# Patient Record
Sex: Female | Born: 1950
Health system: Southern US, Community
[De-identification: ages and names within clinical notes are randomized; demographics above are authoritative.]

## PROBLEM LIST (undated history)

## (undated) DIAGNOSIS — E785 Hyperlipidemia, unspecified: Secondary | ICD-10-CM

## (undated) DIAGNOSIS — I10 Essential (primary) hypertension: Secondary | ICD-10-CM

## (undated) DIAGNOSIS — M109 Gout, unspecified: Secondary | ICD-10-CM

## (undated) DIAGNOSIS — G629 Polyneuropathy, unspecified: Secondary | ICD-10-CM

## (undated) DIAGNOSIS — K219 Gastro-esophageal reflux disease without esophagitis: Secondary | ICD-10-CM

## (undated) DIAGNOSIS — E119 Type 2 diabetes mellitus without complications: Secondary | ICD-10-CM

## (undated) HISTORY — PX: THYROID SURGERY: SHX805

---

## 2009-11-08 ENCOUNTER — Ambulatory Visit (HOSPITAL_COMMUNITY): Admission: RE | Admit: 2009-11-08 | Discharge: 2009-11-08 | Payer: Self-pay | Admitting: Internal Medicine

## 2010-05-21 ENCOUNTER — Encounter: Payer: Self-pay | Admitting: Internal Medicine

## 2016-10-16 DIAGNOSIS — H2513 Age-related nuclear cataract, bilateral: Secondary | ICD-10-CM | POA: Insufficient documentation

## 2016-10-16 DIAGNOSIS — H16223 Keratoconjunctivitis sicca, not specified as Sjogren's, bilateral: Secondary | ICD-10-CM | POA: Insufficient documentation

## 2017-05-23 DIAGNOSIS — Z7984 Long term (current) use of oral hypoglycemic drugs: Secondary | ICD-10-CM | POA: Insufficient documentation

## 2017-05-23 DIAGNOSIS — H43393 Other vitreous opacities, bilateral: Secondary | ICD-10-CM | POA: Insufficient documentation

## 2017-05-23 DIAGNOSIS — H524 Presbyopia: Secondary | ICD-10-CM | POA: Insufficient documentation

## 2019-01-03 ENCOUNTER — Emergency Department (HOSPITAL_BASED_OUTPATIENT_CLINIC_OR_DEPARTMENT_OTHER)
Admission: EM | Admit: 2019-01-03 | Discharge: 2019-01-03 | Disposition: A | Discharge status: Home / Self Care | Payer: Medicare Other | Attending: Emergency Medicine | Admitting: Emergency Medicine

## 2019-01-03 ENCOUNTER — Encounter (HOSPITAL_BASED_OUTPATIENT_CLINIC_OR_DEPARTMENT_OTHER): Payer: Self-pay | Admitting: Emergency Medicine

## 2019-01-03 ENCOUNTER — Other Ambulatory Visit: Payer: Self-pay

## 2019-01-03 DIAGNOSIS — E119 Type 2 diabetes mellitus without complications: Secondary | ICD-10-CM | POA: Insufficient documentation

## 2019-01-03 DIAGNOSIS — I10 Essential (primary) hypertension: Secondary | ICD-10-CM | POA: Insufficient documentation

## 2019-01-03 DIAGNOSIS — R600 Localized edema: Secondary | ICD-10-CM | POA: Diagnosis not present

## 2019-01-03 DIAGNOSIS — Z79899 Other long term (current) drug therapy: Secondary | ICD-10-CM | POA: Diagnosis not present

## 2019-01-03 DIAGNOSIS — M79673 Pain in unspecified foot: Secondary | ICD-10-CM | POA: Diagnosis present

## 2019-01-03 DIAGNOSIS — R609 Edema, unspecified: Secondary | ICD-10-CM

## 2019-01-03 HISTORY — DX: Type 2 diabetes mellitus without complications: E11.9

## 2019-01-03 HISTORY — DX: Hyperlipidemia, unspecified: E78.5

## 2019-01-03 HISTORY — DX: Essential (primary) hypertension: I10

## 2019-01-03 HISTORY — DX: Gout, unspecified: M10.9

## 2019-01-03 MED ORDER — FUROSEMIDE 20 MG PO TABS: 20.0000 mg | ORAL_TABLET | Freq: Every day | ORAL | 0 refills | Status: DC

## 2019-01-03 NOTE — ED Triage Notes (Signed)
Pt c/o bilateral tingle that started this morning when gettting up.Pt reports hx of gout. Pt ambulatory to triage. Denies numbness, denies injury

## 2019-01-03 NOTE — ED Notes (Signed)
Took her Gout med PTA

## 2019-01-03 NOTE — ED Provider Notes (Signed)
[facility] EMERGENCY DEPARTMENT Provider Note   CSN: [id number] Arrival date & time: 01/03/19  1225     History   Chief Complaint Chief Complaint  Patient presents with  . Foot Pain    HPI Mariah Mason is a 68 y.o. female.     HPI Patient reports her feet started feeling "tight" today.  She reports they both just feel like they are stiff.  She denies that there is actual pain.  Is not painful to walk but the feet feel very stiff.  She denies any pain in the calves or behind the knees.  Patient denies any other associated symptoms of redness, fever, chills.  She reports she thought this was a gout flareup so she took a flareup dose of colchicine about 3 hours ago.  She reports he has had gout in the past and it has been painful. Past Medical History:  Diagnosis Date  . Diabetes mellitus without complication (HCC)   . Gout   . Hyperlipemia   . Hypertension     There are no active problems to display for this patient.      OB History   No obstetric history on file.      Home Medications    Prior to Admission medications   Medication Sig Start Date End Date Taking? Authorizing Provider  allopurinol (ZYLOPRIM) 100 MG tablet 100 mg daily.  03/08/16  Yes [provider]  amLODipine (NORVASC) 10 MG tablet 10 mg daily.  01/20/16  Yes [provider]  benazepril (LOTENSIN) 40 MG tablet Take 80 mg by mouth daily.  02/27/16  Yes [provider]  carvedilol (COREG) 12.5 MG tablet Take 12.5 mg by mouth 2 (two) times daily with a meal.  02/06/16  Yes [provider]  colchicine 0.6 MG tablet 0.6 mg as needed.  03/02/16  Yes [provider]  cycloSPORINE (RESTASIS) 0.05 % ophthalmic emulsion INSTILL 1 DROP IN BOTH EYES EVERY 12 HOURS 12/05/18  Yes [provider]  glipiZIDE (GLUCOTROL) 10 MG tablet 10 mg 2 (two) times daily before a meal.  03/08/16  Yes [provider]  spironolactone (ALDACTONE) 25 MG tablet 25 mg  daily.  11/06/10  Yes [provider]    Family History No family history on file.  Social History Social History   Tobacco Use  . Smoking status: Never Smoker  . Smokeless tobacco: Never Used  Substance Use Topics  . Alcohol use: Never    Frequency: Never  . Drug use: Never     Allergies   Patient has no allergy information on record.   Review of Systems Review of Systems 10 Systems reviewed and are negative for acute change except as noted in the HPI.   Physical Exam Updated Vital Signs BP (!) 168/86 (BP Location: Left Arm)   Pulse 100   Temp 99.2 F (37.3 C) (Oral)   Resp 18   Ht 5\' 2"  (1.575 m)   Wt 102.1 kg   SpO2 100%   BMI 41.15 kg/m   Physical Exam Constitutional:      Comments: Alert nontoxic well in appearance no distress.  Eyes:     Extraocular Movements: Extraocular movements intact.     Conjunctiva/sclera: Conjunctivae normal.  Cardiovascular:     Rate and Rhythm: Normal rate and regular rhythm.  Pulmonary:     Effort: Pulmonary effort is normal.     Breath sounds: Normal breath sounds.  Musculoskeletal:     Comments: Both feet  and lower legs are symmetric.  There is trace to 1+ pitting over the dorsum of both feet and the lower aspects of the legs bilaterally.  Calves are soft and nontender.  No joint effusions.  Patient's feet are not tender to palpation or compression.  There is no erythema of the feet or the toes.  No skin lesions or rashes.  2+ symmetric dorsalis pedis pulses.  Skin:    General: Skin is warm and dry.  Neurological:     General: No focal deficit present.     Mental Status: She is oriented to person, place, and time.     Coordination: Coordination normal.  Psychiatric:        Mood and Affect: Mood normal.      ED Treatments / Results  Labs (all labs ordered are listed, but only abnormal results are displayed) Labs Reviewed - No data to display  EKG None  Radiology No results found.  Procedures  Procedures (including critical care time)  Medications Ordered in ED Medications - No data to display   Initial Impression / Assessment and Plan / ED Course  I have reviewed the triage vital signs and the nursing notes.  Pertinent labs & imaging results that were available during my care of the patient were reviewed by me and considered in my medical decision making (see chart for details).       Patient's main complaint is "tight" feeling of both of her feet.  She denies that it is painful and is nontender to palpation.  I do not feel that this fits the picture for gout.  She does have small amount of pitting edema bilaterally over the dorsum of the feet and the lower legs.  No calf tenderness to suggest DVT.  Findings are very symmetric.  Patient does not have any chest pain or shortness of breath.  Most likely dependent peripheral edema.  Patient does take amlodipine which might be a contributing factor.  She is also prescribed spironolactone which she reports taking once daily.  At this time for peripheral edema will have the patient follow instructions for elevating and avoiding prolonged sitting in 1 position with the feet dependent which is what she reports doing frequently.  We will have her take a low dose of Lasix for the next 4 days and then follow-up with PCP.  Return precautions reviewed.  Final Clinical Impressions(s) / ED Diagnoses   Final diagnoses:  Peripheral edema    ED Discharge Orders    None       Charlesetta Shanks, MD 01/03/19 1517

## 2019-01-03 NOTE — Discharge Instructions (Signed)
1.  Elevate your legs above the level of your heart as often as possible.  Try not to sit in 1 position with your legs down for prolonged periods of time.  Get up and walk around. 2.  Take 1 Lasix tablet daily for the next 4 days.  See your doctor for recheck at that time. 3.  Return to the emergency department if you have worsening swelling, pain, fever, shortness of breath or other concerning symptoms.

## 2019-02-17 ENCOUNTER — Other Ambulatory Visit: Payer: Self-pay

## 2019-02-17 ENCOUNTER — Encounter (HOSPITAL_BASED_OUTPATIENT_CLINIC_OR_DEPARTMENT_OTHER): Payer: Self-pay | Admitting: *Deleted

## 2019-02-17 ENCOUNTER — Inpatient Hospital Stay (HOSPITAL_BASED_OUTPATIENT_CLINIC_OR_DEPARTMENT_OTHER)
Admission: EM | Admit: 2019-02-17 | Discharge: 2019-02-20 | DRG: 638 | Disposition: A | Discharge status: Home / Self Care | Payer: Medicaid Other | Attending: Internal Medicine | Admitting: Internal Medicine

## 2019-02-17 DIAGNOSIS — Z881 Allergy status to other antibiotic agents status: Secondary | ICD-10-CM

## 2019-02-17 DIAGNOSIS — Z20828 Contact with and (suspected) exposure to other viral communicable diseases: Secondary | ICD-10-CM | POA: Diagnosis present

## 2019-02-17 DIAGNOSIS — I10 Essential (primary) hypertension: Secondary | ICD-10-CM | POA: Diagnosis present

## 2019-02-17 DIAGNOSIS — E785 Hyperlipidemia, unspecified: Secondary | ICD-10-CM | POA: Diagnosis present

## 2019-02-17 DIAGNOSIS — E119 Type 2 diabetes mellitus without complications: Secondary | ICD-10-CM

## 2019-02-17 DIAGNOSIS — E860 Dehydration: Secondary | ICD-10-CM | POA: Diagnosis present

## 2019-02-17 DIAGNOSIS — Z7984 Long term (current) use of oral hypoglycemic drugs: Secondary | ICD-10-CM

## 2019-02-17 DIAGNOSIS — E872 Acidosis, unspecified: Secondary | ICD-10-CM

## 2019-02-17 DIAGNOSIS — M109 Gout, unspecified: Secondary | ICD-10-CM | POA: Diagnosis present

## 2019-02-17 DIAGNOSIS — Y92009 Unspecified place in unspecified non-institutional (private) residence as the place of occurrence of the external cause: Secondary | ICD-10-CM

## 2019-02-17 DIAGNOSIS — T502X5A Adverse effect of carbonic-anhydrase inhibitors, benzothiadiazides and other diuretics, initial encounter: Secondary | ICD-10-CM | POA: Diagnosis present

## 2019-02-17 DIAGNOSIS — T383X5A Adverse effect of insulin and oral hypoglycemic [antidiabetic] drugs, initial encounter: Secondary | ICD-10-CM | POA: Diagnosis present

## 2019-02-17 DIAGNOSIS — E162 Hypoglycemia, unspecified: Secondary | ICD-10-CM

## 2019-02-17 DIAGNOSIS — E875 Hyperkalemia: Secondary | ICD-10-CM | POA: Diagnosis present

## 2019-02-17 DIAGNOSIS — E11649 Type 2 diabetes mellitus with hypoglycemia without coma: Principal | ICD-10-CM | POA: Diagnosis present

## 2019-02-17 DIAGNOSIS — Z79899 Other long term (current) drug therapy: Secondary | ICD-10-CM

## 2019-02-17 DIAGNOSIS — N179 Acute kidney failure, unspecified: Secondary | ICD-10-CM | POA: Diagnosis present

## 2019-02-17 DIAGNOSIS — T445X5A Adverse effect of predominantly beta-adrenoreceptor agonists, initial encounter: Secondary | ICD-10-CM | POA: Diagnosis present

## 2019-02-17 LAB — CBG MONITORING, ED
Glucose-Capillary: 44 mg/dL — CL (ref 70–99)
Glucose-Capillary: 55 mg/dL — ABNORMAL LOW (ref 70–99)
Glucose-Capillary: 73 mg/dL (ref 70–99)

## 2019-02-17 LAB — COMPREHENSIVE METABOLIC PANEL
ALT: 16 U/L (ref 0–44)
AST: 17 U/L (ref 15–41)
Albumin: 4.5 g/dL (ref 3.5–5.0)
Alkaline Phosphatase: 58 U/L (ref 38–126)
Anion gap: 9 (ref 5–15)
BUN: 68 mg/dL — ABNORMAL HIGH (ref 8–23)
CO2: 11 mmol/L — ABNORMAL LOW (ref 22–32)
Calcium: 9.4 mg/dL (ref 8.9–10.3)
Chloride: 112 mmol/L — ABNORMAL HIGH (ref 98–111)
Creatinine, Ser: 4.09 mg/dL — ABNORMAL HIGH (ref 0.44–1.00)
GFR calc Af Amer: 12 mL/min — ABNORMAL LOW (ref >60)
GFR calc non Af Amer: 10 mL/min — ABNORMAL LOW (ref >60)
Glucose, Bld: 55 mg/dL — ABNORMAL LOW (ref 70–99)
Potassium: 6.4 mmol/L (ref 3.5–5.1)
Sodium: 132 mmol/L — ABNORMAL LOW (ref 135–145)
Total Bilirubin: 0.4 mg/dL (ref 0.3–1.2)
Total Protein: 7.9 g/dL (ref 6.5–8.1)

## 2019-02-17 LAB — CBC WITH DIFFERENTIAL/PLATELET
Abs Immature Granulocytes: 0.02 10*3/uL (ref 0.00–0.07)
Basophils Absolute: 0 10*3/uL (ref 0.0–0.1)
Basophils Relative: 0 %
Eosinophils Absolute: 0.1 10*3/uL (ref 0.0–0.5)
Eosinophils Relative: 2 %
HCT: 39.4 % (ref 36.0–46.0)
Hemoglobin: 12.2 g/dL (ref 12.0–15.0)
Immature Granulocytes: 0 %
Lymphocytes Relative: 19 %
Lymphs Abs: 1.3 10*3/uL (ref 0.7–4.0)
MCH: 28 pg (ref 26.0–34.0)
MCHC: 31 g/dL (ref 30.0–36.0)
MCV: 90.4 fL (ref 80.0–100.0)
Monocytes Absolute: 0.4 10*3/uL (ref 0.1–1.0)
Monocytes Relative: 6 %
Neutro Abs: 4.7 10*3/uL (ref 1.7–7.7)
Neutrophils Relative %: 73 %
Platelets: 272 10*3/uL (ref 150–400)
RBC: 4.36 MIL/uL (ref 3.87–5.11)
RDW: 13 % (ref 11.5–15.5)
WBC: 6.5 10*3/uL (ref 4.0–10.5)
nRBC: 0 % (ref 0.0–0.2)

## 2019-02-17 MED ORDER — SODIUM ZIRCONIUM CYCLOSILICATE 10 G PO PACK
10.0000 g | PACK | ORAL | Status: AC
  Administered 2019-02-18: 10 g via ORAL
  Filled 2019-02-17: qty 1

## 2019-02-17 MED ORDER — SODIUM CHLORIDE 0.9 % IV BOLUS
500.0000 mL | Freq: Once | INTRAVENOUS | Status: AC
  Administered 2019-02-17: 500 mL via INTRAVENOUS

## 2019-02-17 MED ORDER — DEXTROSE-NACL 5-0.9 % IV SOLN
Freq: Once | INTRAVENOUS | Status: AC
  Administered 2019-02-18: via INTRAVENOUS

## 2019-02-17 MED ORDER — ONDANSETRON HCL 4 MG/2ML IJ SOLN
4.0000 mg | Freq: Once | INTRAMUSCULAR | Status: AC
  Administered 2019-02-17: 22:00:00 4 mg via INTRAVENOUS
  Filled 2019-02-17: qty 2

## 2019-02-17 MED ORDER — ALBUTEROL SULFATE HFA 108 (90 BASE) MCG/ACT IN AERS
8.0000 | INHALATION_SPRAY | Freq: Once | RESPIRATORY_TRACT | Status: AC
  Administered 2019-02-18: 8 via RESPIRATORY_TRACT
  Filled 2019-02-17: qty 6.7

## 2019-02-17 MED ORDER — SODIUM BICARBONATE 8.4 % IV SOLN
50.0000 meq | Freq: Once | INTRAVENOUS | Status: AC
  Administered 2019-02-18: 50 meq via INTRAVENOUS
  Filled 2019-02-17: qty 50

## 2019-02-17 NOTE — ED Notes (Signed)
Pt provided with graham crackers, peanut butter, and ginger ale.

## 2019-02-17 NOTE — ED Notes (Signed)
RN Adam informed of CBG. More orange juice, crackers and peanut butter given to Pt per RN Quita Skye

## 2019-02-17 NOTE — ED Notes (Signed)
Date and time results received: 02/17/19 2340 (use smartphrase ".now" to insert current time)  Test: potassium Critical Value: 6.4  Name of Provider Notified: Dr. Laverta Baltimore  Orders Received? Or Actions Taken?: no new orders

## 2019-02-17 NOTE — ED Provider Notes (Signed)
[facility] EMERGENCY DEPARTMENT Provider Note   CSN: [id number] Arrival date & time: 02/17/19  2115     History   Chief Complaint Chief Complaint  Patient presents with  . Hypoglycemia    HPI Mariah Mason is a 68 y.o. female with history of hypertension, hyperlipidemia, diabetes, gout presents with generalized weakness for the past 2 to 3 days.  Patient has not had much of an appetite.  She denies any unilateral weakness.  She has had low blood sugars at home.  She has been taking Metformin and glipizide.  She does not take insulin.  She reports "drawing up of her legs" that she is experienced with taking her cholesterol medication in the past.  Patient has had some associated nausea, but no vomiting.  She denies any abdominal pain or urinary symptoms.     HPI  Past Medical History:  Diagnosis Date  . Diabetes mellitus without complication (HCC)   . Gout   . Hyperlipemia   . Hypertension     There are no active problems to display for this patient.   History reviewed. No pertinent surgical history.   OB History   No obstetric history on file.      Home Medications    Prior to Admission medications   Medication Sig Start Date End Date Taking? Authorizing Provider  allopurinol (ZYLOPRIM) 100 MG tablet 100 mg daily.  03/08/16   [provider]  amLODipine (NORVASC) 10 MG tablet 10 mg daily.  01/20/16   [provider]  benazepril (LOTENSIN) 40 MG tablet Take 80 mg by mouth daily.  02/27/16   [provider]  carvedilol (COREG) 12.5 MG tablet Take 12.5 mg by mouth 2 (two) times daily with a meal.  02/06/16   [provider]  colchicine 0.6 MG tablet 0.6 mg as needed.  03/02/16   [provider]  cycloSPORINE (RESTASIS) 0.05 % ophthalmic emulsion INSTILL 1 DROP IN BOTH EYES EVERY 12 HOURS 12/05/18   [provider]  furosemide (LASIX) 20 MG tablet Take 1 tablet (20 mg total) by mouth daily for 4 days. 01/03/19  01/07/19  Arby BarrettePfeiffer, Marcy, MD  glipiZIDE (GLUCOTROL) 10 MG tablet 10 mg 2 (two) times daily before a meal.  03/08/16   [provider]  spironolactone (ALDACTONE) 25 MG tablet 25 mg daily.  11/06/10   [provider]    Family History No family history on file.  Social History Social History   Tobacco Use  . Smoking status: Never Smoker  . Smokeless tobacco: Never Used  Substance Use Topics  . Alcohol use: Never    Frequency: Never  . Drug use: Never     Allergies   Erythromycin   Review of Systems Review of Systems  Constitutional: Positive for appetite change and fatigue. Negative for chills and fever.  HENT: Negative for facial swelling and sore throat.   Respiratory: Negative for shortness of breath.   Cardiovascular: Negative for chest pain.  Gastrointestinal: Positive for nausea. Negative for abdominal pain and vomiting.  Genitourinary: Negative for dysuria.  Musculoskeletal: Negative for back pain.  Skin: Negative for rash and wound.  Neurological: Positive for weakness. Negative for headaches.  Psychiatric/Behavioral: The patient is not nervous/anxious.      Physical Exam Updated Vital Signs BP (!) 126/56   Pulse 81   Temp 98.2 F (36.8 C) (Oral)   Resp 16   SpO2 100%   Physical Exam Vitals signs and nursing note reviewed.  Constitutional:  General: She is not in acute distress.    Appearance: She is well-developed. She is not diaphoretic.  HENT:     Head: Normocephalic and atraumatic.     Mouth/Throat:     Pharynx: No oropharyngeal exudate.  Eyes:     General: No scleral icterus.       Right eye: No discharge.        Left eye: No discharge.     Conjunctiva/sclera: Conjunctivae normal.     Pupils: Pupils are equal, round, and reactive to light.  Neck:     Musculoskeletal: Normal range of motion and neck supple.     Thyroid: No thyromegaly.  Cardiovascular:     Rate and Rhythm: Normal rate and regular rhythm.     Heart  sounds: Normal heart sounds. No murmur. No friction rub. No gallop.   Pulmonary:     Effort: Pulmonary effort is normal. No respiratory distress.     Breath sounds: Normal breath sounds. No stridor. No wheezing or rales.  Abdominal:     General: Bowel sounds are normal. There is no distension.     Palpations: Abdomen is soft.     Tenderness: There is no abdominal tenderness. There is no guarding or rebound.  Lymphadenopathy:     Cervical: No cervical adenopathy.  Skin:    General: Skin is warm and dry.     Coloration: Skin is not pale.     Findings: No rash.  Neurological:     Mental Status: She is alert.     Coordination: Coordination normal.     Comments: CN 3-12 intact; normal sensation throughout; 5/5 strength in all 4 extremities; equal bilateral grip strength      ED Treatments / Results  Labs (all labs ordered are listed, but only abnormal results are displayed) Labs Reviewed  CBG MONITORING, ED - Abnormal; Notable for the following components:      Result Value   Glucose-Capillary 44 (*)    All other components within normal limits  CBG MONITORING, ED - Abnormal; Notable for the following components:   Glucose-Capillary 55 (*)    All other components within normal limits  CBC WITH DIFFERENTIAL/PLATELET  COMPREHENSIVE METABOLIC PANEL  URINALYSIS, ROUTINE W REFLEX MICROSCOPIC  CBG MONITORING, ED    EKG EKG Interpretation  Date/Time:  Tuesday February 17 2019 21:29:58 EDT Ventricular Rate:  88 PR Interval:    QRS Duration: 82 QT Interval:  330 QTC Calculation: 400 R Axis:   3 Text Interpretation:  Sinus rhythm No STEMI  Confirmed by Alona Bene [phone]) on 02/17/2019 9:55:33 PM   Radiology No results found.  Procedures Procedures (including critical care time)  Medications Ordered in ED Medications  sodium chloride 0.9 % bolus 500 mL (has no administration in time range)  ondansetron (ZOFRAN) injection 4 mg (4 mg Intravenous Given 02/17/19 2139)      Initial Impression / Assessment and Plan / ED Course  I have reviewed the triage vital signs and the nursing notes.  Pertinent labs & imaging results that were available during my care of the patient were reviewed by me and considered in my medical decision making (see chart for details).        Patient presenting with 2-3 day history of generalized weakness and fatigue.  She has had some associated nausea.  She has had some documented low blood sugars at home.  CBC unremarkable.  CMP pending.  Patient initial CBG 44.  Patient given orange juice and food.  She is alert and oriented.  She is able to eat and swallow. At shift change, patient care transferred to Dr. Randal Buba for continued evaluation, follow up of CMP and serial CBG and determination of disposition. Anticipate discharge if CBG is maintaining and no abnormalities in CMP. Patient also evaluated by Dr. Laverta Baltimore who guided the patient's management.  Final Clinical Impressions(s) / ED Diagnoses   Final diagnoses:  Hypoglycemia    ED Discharge Orders    None       Frederica Kuster, PA-C 02/17/19 2308    Margette Fast, MD 02/18/19 1357

## 2019-02-17 NOTE — ED Notes (Signed)
Pt on monitor 

## 2019-02-17 NOTE — ED Notes (Signed)
ED Provider at bedside.Pt provided with 8 oz of orange juice.

## 2019-02-17 NOTE — ED Triage Notes (Signed)
Hypoglycemia. Not eating. Weakness. Symptoms for a few days per daughter.

## 2019-02-18 ENCOUNTER — Observation Stay (HOSPITAL_COMMUNITY): Payer: Medicaid Other

## 2019-02-18 ENCOUNTER — Encounter (HOSPITAL_BASED_OUTPATIENT_CLINIC_OR_DEPARTMENT_OTHER): Payer: Self-pay

## 2019-02-18 DIAGNOSIS — E785 Hyperlipidemia, unspecified: Secondary | ICD-10-CM | POA: Diagnosis present

## 2019-02-18 DIAGNOSIS — E872 Acidosis, unspecified: Secondary | ICD-10-CM

## 2019-02-18 DIAGNOSIS — E162 Hypoglycemia, unspecified: Secondary | ICD-10-CM | POA: Diagnosis not present

## 2019-02-18 DIAGNOSIS — M109 Gout, unspecified: Secondary | ICD-10-CM | POA: Diagnosis present

## 2019-02-18 DIAGNOSIS — E860 Dehydration: Secondary | ICD-10-CM | POA: Diagnosis present

## 2019-02-18 DIAGNOSIS — Z881 Allergy status to other antibiotic agents status: Secondary | ICD-10-CM | POA: Diagnosis not present

## 2019-02-18 DIAGNOSIS — Z7984 Long term (current) use of oral hypoglycemic drugs: Secondary | ICD-10-CM | POA: Diagnosis not present

## 2019-02-18 DIAGNOSIS — E875 Hyperkalemia: Secondary | ICD-10-CM | POA: Diagnosis present

## 2019-02-18 DIAGNOSIS — I10 Essential (primary) hypertension: Secondary | ICD-10-CM | POA: Diagnosis present

## 2019-02-18 DIAGNOSIS — Z20828 Contact with and (suspected) exposure to other viral communicable diseases: Secondary | ICD-10-CM | POA: Diagnosis present

## 2019-02-18 DIAGNOSIS — T502X5A Adverse effect of carbonic-anhydrase inhibitors, benzothiadiazides and other diuretics, initial encounter: Secondary | ICD-10-CM | POA: Diagnosis present

## 2019-02-18 DIAGNOSIS — T445X5A Adverse effect of predominantly beta-adrenoreceptor agonists, initial encounter: Secondary | ICD-10-CM | POA: Diagnosis present

## 2019-02-18 DIAGNOSIS — Z79899 Other long term (current) drug therapy: Secondary | ICD-10-CM | POA: Diagnosis not present

## 2019-02-18 DIAGNOSIS — E119 Type 2 diabetes mellitus without complications: Secondary | ICD-10-CM

## 2019-02-18 DIAGNOSIS — E11649 Type 2 diabetes mellitus with hypoglycemia without coma: Secondary | ICD-10-CM | POA: Diagnosis present

## 2019-02-18 DIAGNOSIS — Y92009 Unspecified place in unspecified non-institutional (private) residence as the place of occurrence of the external cause: Secondary | ICD-10-CM | POA: Diagnosis not present

## 2019-02-18 DIAGNOSIS — T383X5A Adverse effect of insulin and oral hypoglycemic [antidiabetic] drugs, initial encounter: Secondary | ICD-10-CM | POA: Diagnosis present

## 2019-02-18 DIAGNOSIS — N179 Acute kidney failure, unspecified: Secondary | ICD-10-CM

## 2019-02-18 LAB — URINALYSIS, ROUTINE W REFLEX MICROSCOPIC
Bilirubin Urine: NEGATIVE
Glucose, UA: NEGATIVE mg/dL
Hgb urine dipstick: NEGATIVE
Ketones, ur: NEGATIVE mg/dL
Leukocytes,Ua: NEGATIVE
Nitrite: NEGATIVE
Protein, ur: NEGATIVE mg/dL
Specific Gravity, Urine: 1.005 (ref 1.005–1.030)
pH: 5 (ref 5.0–8.0)

## 2019-02-18 LAB — GLUCOSE, CAPILLARY
Glucose-Capillary: 166 mg/dL — ABNORMAL HIGH (ref 70–99)
Glucose-Capillary: 122 mg/dL — ABNORMAL HIGH (ref 70–99)
Glucose-Capillary: 152 mg/dL — ABNORMAL HIGH (ref 70–99)
Glucose-Capillary: 209 mg/dL — ABNORMAL HIGH (ref 70–99)
Glucose-Capillary: 121 mg/dL — ABNORMAL HIGH (ref 70–99)
Glucose-Capillary: 94 mg/dL (ref 70–99)

## 2019-02-18 LAB — HEMOGLOBIN A1C
Hgb A1c MFr Bld: 7.6 % — ABNORMAL HIGH (ref 4.8–5.6)
Mean Plasma Glucose: 171.42 mg/dL

## 2019-02-18 LAB — HIV ANTIBODY (ROUTINE TESTING W REFLEX): HIV Screen 4th Generation wRfx: NONREACTIVE

## 2019-02-18 LAB — BASIC METABOLIC PANEL
Anion gap: 7 (ref 5–15)
BUN: 63 mg/dL — ABNORMAL HIGH (ref 8–23)
CO2: 14 mmol/L — ABNORMAL LOW (ref 22–32)
Calcium: 8.8 mg/dL — ABNORMAL LOW (ref 8.9–10.3)
Chloride: 112 mmol/L — ABNORMAL HIGH (ref 98–111)
Creatinine, Ser: 4.11 mg/dL — ABNORMAL HIGH (ref 0.44–1.00)
GFR calc Af Amer: 12 mL/min — ABNORMAL LOW (ref >60)
GFR calc non Af Amer: 10 mL/min — ABNORMAL LOW (ref >60)
Glucose, Bld: 173 mg/dL — ABNORMAL HIGH (ref 70–99)
Potassium: 5.9 mmol/L — ABNORMAL HIGH (ref 3.5–5.1)
Sodium: 133 mmol/L — ABNORMAL LOW (ref 135–145)

## 2019-02-18 LAB — CBG MONITORING, ED: Glucose-Capillary: 143 mg/dL — ABNORMAL HIGH (ref 70–99)

## 2019-02-18 LAB — SARS CORONAVIRUS 2 (TAT 6-24 HRS): SARS Coronavirus 2: NEGATIVE

## 2019-02-18 MED ORDER — INSULIN ASPART 100 UNIT/ML ~~LOC~~ SOLN: 0.0000 [IU] | Freq: Every day | SUBCUTANEOUS | Status: DC

## 2019-02-18 MED ORDER — DEXTROSE 5 % IV SOLN
INTRAVENOUS | Status: DC
  Administered 2019-02-18: 04:00:00 via INTRAVENOUS

## 2019-02-18 MED ORDER — SODIUM CHLORIDE 0.9 % IV SOLN
INTRAVENOUS | Status: DC
  Administered 2019-02-18 – 2019-02-20 (×5): via INTRAVENOUS

## 2019-02-18 MED ORDER — AMLODIPINE BESYLATE 10 MG PO TABS
10.0000 mg | ORAL_TABLET | Freq: Every day | ORAL | Status: DC
  Administered 2019-02-18 – 2019-02-20 (×3): 10 mg via ORAL
  Filled 2019-02-18 (×3): qty 1

## 2019-02-18 MED ORDER — HEPARIN SODIUM (PORCINE) 5000 UNIT/ML IJ SOLN
5000.0000 [IU] | Freq: Three times a day (TID) | INTRAMUSCULAR | Status: DC
  Administered 2019-02-18 – 2019-02-20 (×7): 5000 [IU] via SUBCUTANEOUS
  Filled 2019-02-18 (×6): qty 1

## 2019-02-18 MED ORDER — CALCIUM GLUCONATE-NACL 1-0.675 GM/50ML-% IV SOLN
1.0000 g | Freq: Once | INTRAVENOUS | Status: AC
  Administered 2019-02-18: 1000 mg via INTRAVENOUS
  Filled 2019-02-18: qty 50

## 2019-02-18 MED ORDER — FLUTICASONE PROPIONATE 50 MCG/ACT NA SUSP
1.0000 | Freq: Every day | NASAL | Status: DC | PRN
  Filled 2019-02-18: qty 16

## 2019-02-18 MED ORDER — ACETAMINOPHEN 325 MG PO TABS: 650.0000 mg | ORAL_TABLET | Freq: Four times a day (QID) | ORAL | Status: DC | PRN

## 2019-02-18 MED ORDER — CYCLOSPORINE 0.05 % OP EMUL
1.0000 [drp] | Freq: Two times a day (BID) | OPHTHALMIC | Status: DC
  Administered 2019-02-18 – 2019-02-20 (×5): 1 [drp] via OPHTHALMIC
  Filled 2019-02-18 (×6): qty 30

## 2019-02-18 MED ORDER — CARVEDILOL 12.5 MG PO TABS
12.5000 mg | ORAL_TABLET | Freq: Two times a day (BID) | ORAL | Status: DC
  Administered 2019-02-18 – 2019-02-20 (×4): 12.5 mg via ORAL
  Filled 2019-02-18 (×4): qty 1

## 2019-02-18 MED ORDER — COLCHICINE 0.6 MG PO TABS: 0.6000 mg | ORAL_TABLET | Freq: Every day | ORAL | Status: DC | PRN

## 2019-02-18 MED ORDER — SODIUM CHLORIDE 0.9 % IV SOLN
INTRAVENOUS | Status: DC
  Administered 2019-02-18: 04:00:00 via INTRAVENOUS

## 2019-02-18 MED ORDER — INSULIN ASPART 100 UNIT/ML ~~LOC~~ SOLN
0.0000 [IU] | Freq: Three times a day (TID) | SUBCUTANEOUS | Status: DC
  Administered 2019-02-18: 18:00:00 3 [IU] via SUBCUTANEOUS
  Administered 2019-02-19: 13:00:00 2 [IU] via SUBCUTANEOUS
  Administered 2019-02-19 – 2019-02-20 (×3): 1 [IU] via SUBCUTANEOUS

## 2019-02-18 MED ORDER — ACETAMINOPHEN 650 MG RE SUPP: 650.0000 mg | Freq: Four times a day (QID) | RECTAL | Status: DC | PRN

## 2019-02-18 MED ORDER — ALLOPURINOL 100 MG PO TABS
100.0000 mg | ORAL_TABLET | Freq: Every day | ORAL | Status: DC
  Administered 2019-02-18 – 2019-02-20 (×3): 100 mg via ORAL
  Filled 2019-02-18 (×3): qty 1

## 2019-02-18 NOTE — TOC Progression Note (Signed)
Transition of Care [facility]) - Progression Note    Patient Details  Name: Mariah Mason MRN: [id number] Date of Birth: Nov 01, 1950  [facility] CM/SW Contact  Zenon Mayo, RN Phone Number: 02/18/2019, 3:49 PM  Clinical Narrative:    Patient from home with daughter , Oley Balm.  Patient states she would like to [facility] [place] to the [facility] here at [facility].  NCM will ast patient in getting apt at the clinic.          Expected Discharge Plan and Services                                                 Social Determinants of Health (SDOH) Interventions    Readmission Risk Interventions No flowsheet data found.

## 2019-02-18 NOTE — ED Notes (Signed)
[facility] here to transport pt 

## 2019-02-18 NOTE — Progress Notes (Signed)
PROGRESS NOTE    Cloma Rahrig  ZJQ:[id number] DOB: 08/13/1950 DOA: 02/17/2019 PCP[facility]    Brief Narrative:  68 y.o. female with medical history significant of non-insulin-dependent diabetes mellitus, hypertension, hyperlipidemia, gout presented to the ED with complaints of hypoglycemia, decreased appetite, and generalized weakness.  Patient states she has not been feeling well for the past few days.  She has felt nauseous but has not vomited.  No abdominal pain or diarrhea.  She has not been eating much due to nausea.  She continues to take her diabetes medications including Metformin and glipizide.  States her blood sugar was low at home which led her to come into the emergency room to be evaluated.  Denies fevers, chills, chest pain, or shortness of breath.  Denies dysuria.  States her nausea has now resolved and she was able to eat food in the ED without any problems.  ED Course: Initial CBG 44.  She was given orange juice and food.  Continued to be hypoglycemic and started on D5-normal saline infusion.  Potassium 6.4. Received medical treatment for hyperkalemia including sodium bicarbonate and Lokelma.  Bicarb 11, anion gap 9.  BUN 68, creatinine 4.0.  No prior baseline  Assessment & Plan:   Principal Problem:   Hypoglycemia Active Problems:   Type 2 diabetes mellitus (HCC)   AKI (acute kidney injury) (HCC)   Hyperkalemia   Metabolic acidosis   Hypoglycemia, non-insulin-dependent diabetes mellitus -Presenting hypoglycemia likely due to glipizide use in the setting of poor oral intake for the past few days.  -Initial CBG 44 in the ED but continued to be hypoglycemic despite receiving orange juice and food.  -Improved overnight with D5 fluids. -Continue to hold glipizide and other hypoglycemic agents -Will cont on carb mod diet  AKI -Presenting BUN 68, creatinine 4.0.  N -Most recent prior Cr from 1/12 noted to be 0.72 -Likely prerenal due to  severe dehydration and home ACE inhibitor and diuretic use.  -Continued with IVF hydration -Renal ultrasound reviewed personally, unremarkable -Very good urine output -Continue to avoid nephrotoxic agents/contrast -Cr slightly higher at 4.11 from 4.09. Will continue IVF hydration, repeat bmet in AM  Hyperkalemia in the setting of AKI -Potassium 6.4 at presentation -Pt given sodium bicarbonate and Lokelma in the ED. -Potassium normalized  Normal anion gap metabolic acidosis -Bicarb 11, anion gap 9 at time of presentation -Serum bicarb improved to 14 with IVF hydration -Repeat BMET in AM  DVT prophylaxis: Heparin subq Code Status: Full Family Communication: Pt in room, family not at bedside Disposition Plan: Uncertain at this time  Consultants:     Procedures:     Antimicrobials: Anti-infectives (From admission, onward)   None       Subjective: Reports feeling better today  Objective: Vitals:   02/18/19 0316 02/18/19 0326 02/18/19 0801 02/18/19 1122  BP: (!) 124/53  (!) 155/71 (!) 148/69  Pulse: 94  91 93  Resp: 18  18 20   Temp: 98.9 F (37.2 C)  98.3 F (36.8 C) 98.4 F (36.9 C)  TempSrc: Oral  Oral Oral  SpO2: 100%  100% 100%  Weight:  95 kg    Height:  5\' 2"  (1.575 m)      Intake/Output Summary (Last 24 hours) at 02/18/2019 1406 Last data filed at 02/18/2019 0743 Gross per 24 hour  Intake 1101.34 ml  Output 300 ml  Net 801.34 ml   Filed Weights   02/18/19 0326  Weight: 95 kg  Examination:  General exam: Appears calm and comfortable  Respiratory system: Clear to auscultation. Respiratory effort normal. Cardiovascular system: S1 & S2 heard, RRR Gastrointestinal system: Abdomen is nondistended, soft and nontender. No organomegaly or masses felt. Normal bowel sounds heard. Central nervous system: Alert and oriented. No focal neurological deficits. Extremities: Symmetric 5 x 5 power. Skin: No rashes, lesions Psychiatry: Judgement and  insight appear normal. Mood & affect appropriate.   Data Reviewed: I have personally reviewed following labs and imaging studies  CBC: Recent Labs  Lab 02/17/19 2135  WBC 6.5  NEUTROABS 4.7  HGB 12.2  HCT 39.4  MCV 90.4  PLT 272   Basic Metabolic Panel: Recent Labs  Lab 02/17/19 2135 02/18/19 0432  NA 132* 133*  K 6.4* 5.9*  CL 112* 112*  CO2 11* 14*  GLUCOSE 55* 173*  BUN 68* 63*  CREATININE 4.09* 4.11*  CALCIUM 9.4 8.8*   GFR: Estimated Creatinine Clearance: 13.9 mL/min (A) (by C-G formula based on SCr of 4.11 mg/dL (H)). Liver Function Tests: Recent Labs  Lab 02/17/19 2135  AST 17  ALT 16  ALKPHOS 58  BILITOT 0.4  PROT 7.9  ALBUMIN 4.5   No results for input(s): LIPASE, AMYLASE in the last 168 hours. No results for input(s): AMMONIA in the last 168 hours. Coagulation Profile: No results for input(s): INR, PROTIME in the last 168 hours. Cardiac Enzymes: No results for input(s): CKTOTAL, CKMB, CKMBINDEX, TROPONINI in the last 168 hours. BNP (last 3 results) No results for input(s): PROBNP in the last 8760 hours. HbA1C: Recent Labs    02/18/19 0432  HGBA1C 7.6*   CBG: Recent Labs  Lab 02/17/19 2241 02/18/19 0052 02/18/19 0313 02/18/19 0627 02/18/19 0807  GLUCAP 73 143* 166* 122* 152*   Lipid Profile: No results for input(s): CHOL, HDL, LDLCALC, TRIG, CHOLHDL, LDLDIRECT in the last 72 hours. Thyroid Function Tests: No results for input(s): TSH, T4TOTAL, FREET4, T3FREE, THYROIDAB in the last 72 hours. Anemia Panel: No results for input(s): VITAMINB12, FOLATE, FERRITIN, TIBC, IRON, RETICCTPCT in the last 72 hours. Sepsis Labs: No results for input(s): PROCALCITON, LATICACIDVEN in the last 168 hours.  No results found for this or any previous visit (from the past 240 hour(s)).   Radiology Studies: [place] Renal  Result Date: 02/18/2019 CLINICAL DATA:  Acute kidney injury. EXAM: RENAL / URINARY TRACT ULTRASOUND COMPLETE COMPARISON:  None.  FINDINGS: Right Kidney: Renal measurements: 10.8 x 5.0 x 4.4 cm = volume: 123 mL . Echogenicity within normal limits. No mass or hydronephrosis visualized. Left Kidney: Renal measurements: 10.3 x 5.8 x 5.7 cm = volume: 177 mL. Two simple cysts are noted, the largest measuring 1.9 cm echogenicity within normal limits. No mass or hydronephrosis visualized. Bladder: Appears normal for degree of bladder distention. Other: None. IMPRESSION: Two simple left renal cysts.  No other renal abnormality is noted. Electronically Signed   By: Lupita RaiderJames  Green Jr M.D.   On: 02/18/2019 09:47    Scheduled Meds: . allopurinol  100 mg Oral Daily  . amLODipine  10 mg Oral Daily  . carvedilol  12.5 mg Oral BID WC  . cycloSPORINE  1 drop Both Eyes BID  . heparin  5,000 Units Subcutaneous Q8H  . insulin aspart  0-5 Units Subcutaneous QHS  . insulin aspart  0-9 Units Subcutaneous TID WC   Continuous Infusions: . sodium chloride 100 mL/hr at 02/18/19 1331     LOS: 0 days   Rickey BarbaraStephen , MD [facility] Pager On Amion  If 7PM-7AM, please contact night-coverage 02/18/2019, 2:06 PM

## 2019-02-18 NOTE — ED Notes (Signed)
ED TO INPATIENT HANDOFF REPORT  ED Nurse Name and Phone #: Marisa Hua RN [phone]  S Name/Age/Gender Mariah Mason 68 y.o. female Room/Bed: MH01/MH01  Code Status   Code Status: Not on file  Home/SNF/Other Home Patient oriented to: self, place, time and situation Is this baseline? Yes   Triage Complete: Triage complete  Chief Complaint weakness and sugar high  Triage Note Hypoglycemia. Not eating. Weakness. Symptoms for a few days per daughter.    Allergies Allergies  Allergen Reactions  . Erythromycin Other (See Comments)    Level of Care/Admitting Diagnosis ED Disposition    ED Disposition Condition Comment   Admit  Hospital Area: [facility] [[id number]]  Level of Care: Telemetry Medical [104]  I expect the patient will be discharged within 24 hours: No (not a candidate for 5C-Observation unit)  Covid Evaluation: Asymptomatic Screening Protocol (No Symptoms)  Diagnosis: Hypoglycemia [938101]  Admitting Physician: Rise Patience [phone]  Attending Physician: Rise Patience [email or web address]  PT Class (Do Not Modify): Observation [104]  PT Acc Code (Do Not Modify): Observation [10022]       B Medical/Surgery History Past Medical History:  Diagnosis Date  . Diabetes mellitus without complication ([facility])   . Gout   . Hyperlipemia   . Hypertension    History reviewed. No pertinent surgical history.   A IV Location/Drains/Wounds Patient Lines/Drains/Airways Status   Active Line/Drains/Airways    Name:   Placement date:   Placement time:   Site:   Days:   Peripheral IV 02/17/19 Left Antecubital   02/17/19    2135    Antecubital   1          Intake/Output Last 24 hours  Intake/Output Summary (Last 24 hours) at 02/18/2019 0140 Last data filed at 02/18/2019 0021 Gross per 24 hour  Intake 500.1 ml  Output -  Net 500.1 ml    Labs/Imaging Results for orders placed or performed during the hospital encounter of 02/17/19 (from the past  48 hour(s))  CBG monitoring, ED     Status: Abnormal   Collection Time: 02/17/19  9:24 PM  Result Value Ref Range   Glucose-Capillary 44 (LL) 70 - 99 mg/dL   Comment 1 Document in Chart   Comprehensive metabolic panel     Status: Abnormal   Collection Time: 02/17/19  9:35 PM  Result Value Ref Range   Sodium 132 (L) 135 - 145 mmol/L   Potassium 6.4 (HH) 3.5 - 5.1 mmol/L    Comment: CRITICAL RESULT CALLED TO, READ BACK BY AND VERIFIED WITH: NEAL KELLIE RN AT 2585 ON 02/17/19 BY I.[email or web address] NO VISIBLE HEMOLYSIS    Chloride 112 (H) 98 - 111 mmol/L   CO2 11 (L) 22 - 32 mmol/L   Glucose, Bld 55 (L) 70 - 99 mg/dL   BUN 68 (H) 8 - 23 mg/dL   Creatinine, Ser 4.09 (H) 0.44 - 1.00 mg/dL   Calcium 9.4 8.9 - 10.3 mg/dL   Total Protein 7.9 6.5 - 8.1 g/dL   Albumin 4.5 3.5 - 5.0 g/dL   AST 17 15 - 41 U/L   ALT 16 0 - 44 U/L   Alkaline Phosphatase 58 38 - 126 U/L   Total Bilirubin 0.4 0.3 - 1.2 mg/dL   GFR calc non Af Amer 10 (L) >60 mL/min   GFR calc Af Amer 12 (L) >60 mL/min   Anion gap 9 5 - 15    Comment: Performed at [facility], [address] [place], [place] [address]  CBC with Differential     Status: None   Collection Time: 02/17/19  9:35 PM  Result Value Ref Range   WBC 6.5 4.0 - 10.5 K/uL   RBC 4.36 3.87 - 5.11 MIL/uL   Hemoglobin 12.2 12.0 - 15.0 g/dL   HCT 32.6 71.2 - 45.8 %   MCV 90.4 80.0 - 100.0 fL   MCH 28.0 26.0 - 34.0 pg   MCHC 31.0 30.0 - 36.0 g/dL   RDW 09.9 83.3 - 82.5 %   Platelets 272 150 - 400 K/uL   nRBC 0.0 0.0 - 0.2 %   Neutrophils Relative % 73 %   Neutro Abs 4.7 1.7 - 7.7 K/uL   Lymphocytes Relative 19 %   Lymphs Abs 1.3 0.7 - 4.0 K/uL   Monocytes Relative 6 %   Monocytes Absolute 0.4 0.1 - 1.0 K/uL   Eosinophils Relative 2 %   Eosinophils Absolute 0.1 0.0 - 0.5 K/uL   Basophils Relative 0 %   Basophils Absolute 0.0 0.0 - 0.1 K/uL   Immature Granulocytes 0 %   Abs Immature Granulocytes 0.02 0.00 - 0.07 K/uL    Comment: Performed at [facility], [address] [place], [place] [address]  POC CBG, ED     Status: Abnormal   Collection Time: 02/17/19 10:06 PM  Result Value Ref Range   Glucose-Capillary 55 (L) 70 - 99 mg/dL  CBG monitoring, ED     Status: None   Collection Time: 02/17/19 10:41 PM  Result Value Ref Range   Glucose-Capillary 73 70 - 99 mg/dL  CBG monitoring, ED     Status: Abnormal   Collection Time: 02/18/19 12:52 AM  Result Value Ref Range   Glucose-Capillary 143 (H) 70 - 99 mg/dL   No results found.  Pending Labs Unresulted Labs (From admission, onward)    Start     Ordered   02/17/19 2341  SARS CORONAVIRUS 2 (TAT 6-24 HRS) Nasopharyngeal Nasopharyngeal Swab  (Symptomatic/High Risk of Exposure/Tier 1 Patients Labs with Precautions)  Once,   STAT    Question Answer Comment  Is this test for diagnosis or screening Diagnosis of ill patient   Symptomatic for COVID-19 as defined by CDC Yes   Date of Symptom Onset 02/17/2019   Hospitalized for COVID-19 Yes   Admitted to ICU for COVID-19 No   Previously tested for COVID-19 No   Resident in a congregate (group) care setting No   Employed in healthcare setting No   Pregnant No      02/17/19 2342   02/17/19 2132  Urinalysis, Routine w reflex microscopic  ONCE - STAT,   STAT     02/17/19 2132          Vitals/Pain Today's Vitals   02/18/19 0000 02/18/19 0003 02/18/19 0030 02/18/19 0130  BP: 139/63  (!) 148/73 140/69  Pulse: 90  93 93  Resp: 17  16 17   Temp:      TempSrc:      SpO2: 100% 99% 100% 99%  PainSc:        Isolation Precautions No active isolations  Medications Medications  ondansetron (ZOFRAN) injection 4 mg (4 mg Intravenous Given 02/17/19 2139)  sodium chloride 0.9 % bolus 500 mL ( Intravenous Stopped 02/18/19 0014)  sodium bicarbonate injection 50 mEq (50 mEq Intravenous Given 02/18/19 0018)  sodium zirconium cyclosilicate (LOKELMA) packet 10 g (10 g Oral Given 02/18/19 0011)  albuterol (  VENTOLIN HFA) 108 (90  Base) MCG/ACT inhaler 8 puff (8 puffs Inhalation Given 02/18/19 0003)  dextrose 5 %-0.9 % sodium chloride infusion ( Intravenous New Bag/Given 02/18/19 0018)    Mobility walks with device Low fall risk       R Recommendations: See Admitting Provider Note  Report given to:   Additional Notes:

## 2019-02-18 NOTE — H&P (Signed)
History and Physical    Mariah Mason ZDG:[id number] DOB: 03/31/1951 DOA: 02/17/2019  [facility]  Chief Complaint: Hypoglycemia  HPI: Mariah Mason is a 68 y.o. female with medical history significant of non-insulin-dependent diabetes mellitus, hypertension, hyperlipidemia, gout presented to the ED with complaints of hypoglycemia, decreased appetite, and generalized weakness.  Patient states she has not been feeling well for the past few days.  She has felt nauseous but has not vomited.  No abdominal pain or diarrhea.  She has not been eating much due to nausea.  She continues to take her diabetes medications including Metformin and glipizide.  States her blood sugar was low at home which led her to come into the emergency room to be evaluated.  Denies fevers, chills, chest pain, or shortness of breath.  Denies dysuria.  States her nausea has now resolved and she was able to eat food in the ED without any problems.  ED Course: Initial CBG 44.  She was given orange juice and food.  Continued to be hypoglycemic and started on D5-normal saline infusion.  Potassium 6.4. Received medical treatment for hyperkalemia including sodium bicarbonate and Lokelma.  Bicarb 11, anion gap 9.  BUN 68, creatinine 4.0.  No prior baseline.  No leukocytosis.  SARS-CoV-2 test pending.  Review of Systems:  All systems reviewed and apart from history of presenting illness, are negative.  Past Medical History:  Diagnosis Date  . Diabetes mellitus without complication (HCC)   . Gout   . Hyperlipemia   . Hypertension     History reviewed. No pertinent surgical history.   reports that she has never smoked. She has never used smokeless tobacco. She reports that she does not drink alcohol or use drugs.  Allergies  Allergen Reactions  . Erythromycin Other (See Comments)    History reviewed. No pertinent family history.  Prior to Admission medications    Medication Sig Start Date End Date Taking? Authorizing Provider  allopurinol (ZYLOPRIM) 100 MG tablet 100 mg daily.  03/08/16   [provider]  amLODipine (NORVASC) 10 MG tablet 10 mg daily.  01/20/16   [provider]  benazepril (LOTENSIN) 40 MG tablet Take 80 mg by mouth daily.  02/27/16   [provider]  carvedilol (COREG) 12.5 MG tablet Take 12.5 mg by mouth 2 (two) times daily with a meal.  02/06/16   [provider]  colchicine 0.6 MG tablet 0.6 mg as needed.  03/02/16   [provider]  cycloSPORINE (RESTASIS) 0.05 % ophthalmic emulsion INSTILL 1 DROP IN BOTH EYES EVERY 12 HOURS 12/05/18   [provider]  furosemide (LASIX) 20 MG tablet Take 1 tablet (20 mg total) by mouth daily for 4 days. 01/03/19 01/07/19  Arby BarrettePfeiffer, Marcy, MD  glipiZIDE (GLUCOTROL) 10 MG tablet 10 mg 2 (two) times daily before a meal.  03/08/16   [provider]  spironolactone (ALDACTONE) 25 MG tablet 25 mg daily.  11/06/10   [provider]    Physical Exam: Vitals:   02/18/19 0130 02/18/19 0200 02/18/19 0316 02/18/19 0326  BP: 140/69 140/65 (!) 124/53   Pulse: 93 89 94   Resp: 17 17 18    Temp:   98.9 F (37.2 C)   TempSrc:   Oral   SpO2: 99% 100% 100%   Weight:    95 kg  Height:    5\' 2"  (1.575 m)    Physical Exam  Constitutional: She is oriented  to person, place, and time. She appears well-developed and well-nourished. No distress.  HENT:  Head: Normocephalic.  Dry mucous membranes  Eyes: Right eye exhibits no discharge. Left eye exhibits no discharge.  Neck: Neck supple.  Cardiovascular: Normal rate, regular rhythm and intact distal pulses.  Pulmonary/Chest: Effort normal and breath sounds normal. No respiratory distress. She has no wheezes. She has no rales.  Abdominal: Soft. Bowel sounds are normal. She exhibits no distension. There is no abdominal tenderness. There is no guarding.  Musculoskeletal:        General: No edema.   Neurological: She is alert and oriented to person, place, and time.  Skin: Skin is warm and dry. She is not diaphoretic.     Labs on Admission: I have personally reviewed following labs and imaging studies  CBC: Recent Labs  Lab 02/17/19 2135  WBC 6.5  NEUTROABS 4.7  HGB 12.2  HCT 39.4  MCV 90.4  PLT 272   Basic Metabolic Panel: Recent Labs  Lab 02/17/19 2135  NA 132*  K 6.4*  CL 112*  CO2 11*  GLUCOSE 55*  BUN 68*  CREATININE 4.09*  CALCIUM 9.4   GFR: Estimated Creatinine Clearance: 14 mL/min (A) (by C-G formula based on SCr of 4.09 mg/dL (H)). Liver Function Tests: Recent Labs  Lab 02/17/19 2135  AST 17  ALT 16  ALKPHOS 58  BILITOT 0.4  PROT 7.9  ALBUMIN 4.5   No results for input(s): LIPASE, AMYLASE in the last 168 hours. No results for input(s): AMMONIA in the last 168 hours. Coagulation Profile: No results for input(s): INR, PROTIME in the last 168 hours. Cardiac Enzymes: No results for input(s): CKTOTAL, CKMB, CKMBINDEX, TROPONINI in the last 168 hours. BNP (last 3 results) No results for input(s): PROBNP in the last 8760 hours. HbA1C: No results for input(s): HGBA1C in the last 72 hours. CBG: Recent Labs  Lab 02/17/19 2124 02/17/19 2206 02/17/19 2241 02/18/19 0052  GLUCAP 44* 55* 73 143*   Lipid Profile: No results for input(s): CHOL, HDL, LDLCALC, TRIG, CHOLHDL, LDLDIRECT in the last 72 hours. Thyroid Function Tests: No results for input(s): TSH, T4TOTAL, FREET4, T3FREE, THYROIDAB in the last 72 hours. Anemia Panel: No results for input(s): VITAMINB12, FOLATE, FERRITIN, TIBC, IRON, RETICCTPCT in the last 72 hours. Urine analysis: No results found for: COLORURINE, APPEARANCEUR, LABSPEC, PHURINE, GLUCOSEU, HGBUR, BILIRUBINUR, KETONESUR, PROTEINUR, UROBILINOGEN, NITRITE, LEUKOCYTESUR  Radiological Exams on Admission: No results found.  EKG: Independently reviewed.  Sinus rhythm.  Peaked T waves in V2 and V3, no prior EKG for comparison.   Assessment/Plan Principal Problem:   Hypoglycemia Active Problems:   Type 2 diabetes mellitus (HCC)   AKI (acute kidney injury) (HCC)   Hyperkalemia   Metabolic acidosis   Hypoglycemia, non-insulin-dependent diabetes mellitus Hypoglycemia likely due to glipizide use in the setting of poor oral intake for the past few days.  Initial CBG 44 in the ED but continued to be hypoglycemic despite receiving orange juice and food.  She was started on D5 infusion.  Blood glucose now improved. -Continue D5 infusion at 50 cc/h -CBG checks every 2 hours -Hold glipizide and other hypoglycemic agents -Check A1c -Regular diet  AKI BUN 68, creatinine 4.0.  No prior baseline.  Likely prerenal due to severe dehydration and home ACE inhibitor and diuretic use.  Appears dry on exam. -IV fluid hydration -Continue to monitor renal function -Renal ultrasound -Monitor urine output -Avoid nephrotoxic agents/contrast -UA pending  Hyperkalemia in the setting of AKI Potassium  6.4.  EKG with peaked T waves in V2 and V3, no prior EKG for comparison. -Received medical treatment for hyperkalemia including sodium bicarbonate and Lokelma in the ED. -Calcium gluconate -Repeat potassium level and continue medical therapy if potassium still high -Repeat EKG  Normal anion gap metabolic acidosis Bicarb 11, anion gap 9.  Likely related to AKI. -IV fluid hydration -Continue to monitor BMP  DVT prophylaxis: Subcutaneous heparin Code Status: Patient wishes to be full code. Family Communication: No family available. Disposition Plan: Anticipate discharge after clinical improvement. Consults called: None Admission status: It is my clinical opinion that referral for OBSERVATION is reasonable and necessary in this patient based on the above information provided. The aforementioned taken together are felt to place the patient at high risk for further clinical deterioration. However it is anticipated that the patient may  be medically stable for discharge from the hospital within 24 to 48 hours.  The medical decision making on this patient was of high complexity and the patient is at high risk for clinical deterioration, therefore this is a level 3 visit.  Shela Leff MD [facility] Pager [phone]  If 7PM-7AM, please contact night-coverage [email or web address] Password [email or web address]  02/18/2019, 5:06 AM

## 2019-02-18 NOTE — Progress Notes (Signed)
Pt. Arrived to the floor via [facility]. Alert/oriented x4. 1 assist with mobility. No s/s distress noted.  Denies pain/ nausea at this time. CBG 166. Covid test in process, asymptomatic. IVF infusing. Skin check completed/ dry & intact. Bed low with wheels locked. Call bell within reach. [facility] admissions made aware and MD notified of patient arrival.   Pt also made aware of visitor policy. She has several daughters that she wants to designate visitor after speaking with them later today.

## 2019-02-18 NOTE — ED Notes (Signed)
[facility] notified (Tara) - patient ready for transport 

## 2019-02-19 LAB — COMPREHENSIVE METABOLIC PANEL
ALT: 13 U/L (ref 0–44)
AST: 12 U/L — ABNORMAL LOW (ref 15–41)
Albumin: 3.5 g/dL (ref 3.5–5.0)
Alkaline Phosphatase: 53 U/L (ref 38–126)
Anion gap: 4 — ABNORMAL LOW (ref 5–15)
BUN: 47 mg/dL — ABNORMAL HIGH (ref 8–23)
CO2: 14 mmol/L — ABNORMAL LOW (ref 22–32)
Calcium: 8.6 mg/dL — ABNORMAL LOW (ref 8.9–10.3)
Chloride: 121 mmol/L — ABNORMAL HIGH (ref 98–111)
Creatinine, Ser: 2.37 mg/dL — ABNORMAL HIGH (ref 0.44–1.00)
GFR calc Af Amer: 23 mL/min — ABNORMAL LOW (ref >60)
GFR calc non Af Amer: 20 mL/min — ABNORMAL LOW (ref >60)
Glucose, Bld: 142 mg/dL — ABNORMAL HIGH (ref 70–99)
Potassium: 6 mmol/L — ABNORMAL HIGH (ref 3.5–5.1)
Sodium: 139 mmol/L (ref 135–145)
Total Bilirubin: 0.6 mg/dL (ref 0.3–1.2)
Total Protein: 6.3 g/dL — ABNORMAL LOW (ref 6.5–8.1)

## 2019-02-19 LAB — GLUCOSE, CAPILLARY
Glucose-Capillary: 127 mg/dL — ABNORMAL HIGH (ref 70–99)
Glucose-Capillary: 154 mg/dL — ABNORMAL HIGH (ref 70–99)
Glucose-Capillary: 117 mg/dL — ABNORMAL HIGH (ref 70–99)
Glucose-Capillary: 129 mg/dL — ABNORMAL HIGH (ref 70–99)

## 2019-02-19 LAB — CBC
HCT: 32.8 % — ABNORMAL LOW (ref 36.0–46.0)
Hemoglobin: 10.5 g/dL — ABNORMAL LOW (ref 12.0–15.0)
MCH: 28.1 pg (ref 26.0–34.0)
MCHC: 32 g/dL (ref 30.0–36.0)
MCV: 87.7 fL (ref 80.0–100.0)
Platelets: 212 10*3/uL (ref 150–400)
RBC: 3.74 MIL/uL — ABNORMAL LOW (ref 3.87–5.11)
RDW: 13.1 % (ref 11.5–15.5)
WBC: 3.9 10*3/uL — ABNORMAL LOW (ref 4.0–10.5)
nRBC: 0 % (ref 0.0–0.2)

## 2019-02-19 MED ORDER — SODIUM POLYSTYRENE SULFONATE 15 GM/60ML PO SUSP
30.0000 g | Freq: Once | ORAL | Status: AC
  Administered 2019-02-19: 14:00:00 30 g via ORAL
  Filled 2019-02-19: qty 120

## 2019-02-19 MED ORDER — SODIUM BICARBONATE 650 MG PO TABS
650.0000 mg | ORAL_TABLET | Freq: Two times a day (BID) | ORAL | Status: DC
  Administered 2019-02-19 – 2019-02-20 (×3): 650 mg via ORAL
  Filled 2019-02-19 (×3): qty 1

## 2019-02-19 NOTE — Progress Notes (Signed)
Diabetes Coordinator consult ordered and exit notes Diabetes and nutrition and Hypoglycemia given to patient for education.Arthor Captain Lpn

## 2019-02-19 NOTE — Progress Notes (Signed)
Inpatient Diabetes Program Recommendations  AACE/ADA: New Consensus Statement on Inpatient Glycemic Control (2015)  Target Ranges:  Prepandial:   less than 140 mg/dL      Peak postprandial:   less than 180 mg/dL (1-2 hours)      Critically ill patients:  140 - 180 mg/dL   Lab Results  Component Value Date   GLUCAP 154 (H) 02/19/2019   HGBA1C 7.6 (H) 02/18/2019    Review of Glycemic Control  Diabetes history: DM2 Outpatient Diabetes medications: metformin 1000 mg bid, glipizide 10 mg bid Current orders for Inpatient glycemic control: Novolog 0-9 units tidwc and 0-5 units QHS  HgbA1C - 7.6%  Inpatient Diabetes Program Recommendations:     D/C glipizide and f/u with PCP.  Likely glipizide is causing hypoglycemia since Creatinine is 2.37. May benefit from addition of DPP-4 such as Januvia or Tradjenta.  Will continue to follow.  Thank you. Lorenda Peck, RD, LDN, CDE Inpatient Diabetes Coordinator [phone]

## 2019-02-19 NOTE — Plan of Care (Signed)
  Problem: Education: Goal: Knowledge of General Education information will improve Description: Including pain rating scale, medication(s)/side effects and non-pharmacologic comfort measures Outcome: Progressing  Exit notes on Diabetes and nutrition, hypoglycemia given Problem: Activity: Goal: Risk for activity intolerance will decrease Outcome: Progressing   Problem: Nutrition: Goal: Adequate nutrition will be maintained Outcome: Progressing   Problem: Elimination: Goal: Will not experience complications related to bowel motility Outcome: Progressing Goal: Will not experience complications related to urinary retention Outcome: Progressing   Problem: Coping: Goal: Level of anxiety will decrease Outcome: Progressing   Problem: Pain Managment: Goal: General experience of comfort will improve Outcome: Progressing   Problem: Safety: Goal: Ability to remain free from injury will improve Outcome: Progressing   Problem: Skin Integrity: Goal: Risk for impaired skin integrity will decrease Outcome: Progressing

## 2019-02-19 NOTE — Progress Notes (Signed)
PROGRESS NOTE    Mariah Mason  ZOX:[id number] DOB: [birth year] DOA: 02/17/2019 PCP: [facility]    Brief Narrative:  68 y.o. female with medical history significant of non-insulin-dependent diabetes mellitus, hypertension, hyperlipidemia, gout presented to the ED with complaints of hypoglycemia, decreased appetite, and generalized weakness.  Patient states she has not been feeling well for the past few days.  She has felt nauseous but has not vomited.  No abdominal pain or diarrhea.  She has not been eating much due to nausea.  She continues to take her diabetes medications including Metformin and glipizide.  States her blood sugar was low at home which led her to come into the emergency room to be evaluated.  Denies fevers, chills, chest pain, or shortness of breath.  Denies dysuria.  States her nausea has now resolved and she was able to eat food in the ED without any problems.  ED Course: Initial CBG 44.  She was given orange juice and food.  Continued to be hypoglycemic and started on D5-normal saline infusion.  Potassium 6.4. Received medical treatment for hyperkalemia including sodium bicarbonate and Lokelma.  Bicarb 11, anion gap 9.  BUN 68, creatinine 4.0.  No prior baseline  Assessment & Plan:   Principal Problem:   Hypoglycemia Active Problems:   Type 2 diabetes mellitus (HCC)   AKI (acute kidney injury) (HCC)   Hyperkalemia   Metabolic acidosis   Hypoglycemia, non-insulin-dependent diabetes mellitus -Presenting hypoglycemia likely due to glipizide use in the setting of poor oral intake for the past few days.  -Initial CBG 44 in the ED but continued to be hypoglycemic despite receiving orange juice and food.  -Glucose now improved with d5 fluids -Will continue to hold glipizide and other hypoglycemic agents -Continue on carb mod diet as tolerated  AKI -Presenting BUN 68, creatinine 4.0.  N -Most recent prior Cr from 1/12 noted to be 0.72  -Likely prerenal due to severe dehydration and home ACE inhibitor and diuretic use.  -Continued with IVF hydration, although will decrease to 75cc/hr -Renal ultrasound reviewed personally, unremarkable -Continues with very good urine output -Continue to avoid nephrotoxic agents/contrast -Cr now improving  Hyperkalemia in the setting of AKI -Potassium 6.4 at presentation -Pt given sodium bicarbonate and Lokelma in the ED. -Potassium 6.0 this AM. Will give one dose of kayexelate. -Repeat bmet in AM  Normal anion gap metabolic acidosis -Bicarb 11, anion gap 9 at time of presentation -Serum bicarb improved with hydration, remains at 14 -Will start po sodium bicarb -Repeat bmet in AM  DVT prophylaxis: Heparin subq Code Status: Full Family Communication: Pt in room, family at bedside Disposition Plan: Possible d/c home in 24hrs if renal function improves  Consultants:     Procedures:     Antimicrobials: Anti-infectives (From admission, onward)   None      Subjective: Feels even better today. In good spirits  Objective: Vitals:   02/19/19 0404 02/19/19 0656 02/19/19 0842 02/19/19 1123  BP: 133/67  (!) 146/67 (!) 152/72  Pulse: 84  87 80  Resp: 20   20  Temp: 98.6 F (37 C)  98.5 F (36.9 C) 98.4 F (36.9 C)  TempSrc: Oral  Oral Oral  SpO2: 100%  100% 100%  Weight:  93.4 kg    Height:        Intake/Output Summary (Last 24 hours) at 02/19/2019 1351 Last data filed at 02/19/2019 1132 Gross per 24 hour  Intake 2281.77 ml  Output 4500 ml  Net -2218.23 ml   Filed Weights   02/18/19 0326 02/19/19 0656  Weight: 95 kg 93.4 kg    Examination: General exam: Awake, laying in bed, in nad, membranes appear dry Respiratory system: Normal respiratory effort, no wheezing Cardiovascular system: regular rate, s1, s2 Gastrointestinal system: Soft, nondistended, positive BS Central nervous system: CN2-12 grossly intact, strength intact Extremities: Perfused, no  clubbing Skin: Normal skin turgor, no notable skin lesions seen Psychiatry: Mood normal // no visual hallucinations    Data Reviewed: I have personally reviewed following labs and imaging studies  CBC: Recent Labs  Lab 02/17/19 2135 02/19/19 0423  WBC 6.5 3.9*  NEUTROABS 4.7  --   HGB 12.2 10.5*  HCT 39.4 32.8*  MCV 90.4 87.7  PLT 272 381   Basic Metabolic Panel: Recent Labs  Lab 02/17/19 2135 02/18/19 0432 02/19/19 0423  NA 132* 133* 139  K 6.4* 5.9* 6.0*  CL 112* 112* 121*  CO2 11* 14* 14*  GLUCOSE 55* 173* 142*  BUN 68* 63* 47*  CREATININE 4.09* 4.11* 2.37*  CALCIUM 9.4 8.8* 8.6*   GFR: Estimated Creatinine Clearance: 23.8 mL/min (A) (by C-G formula based on SCr of 2.37 mg/dL (H)). Liver Function Tests: Recent Labs  Lab 02/17/19 2135 02/19/19 0423  AST 17 12*  ALT 16 13  ALKPHOS 58 53  BILITOT 0.4 0.6  PROT 7.9 6.3*  ALBUMIN 4.5 3.5   No results for input(s): LIPASE, AMYLASE in the last 168 hours. No results for input(s): AMMONIA in the last 168 hours. Coagulation Profile: No results for input(s): INR, PROTIME in the last 168 hours. Cardiac Enzymes: No results for input(s): CKTOTAL, CKMB, CKMBINDEX, TROPONINI in the last 168 hours. BNP (last 3 results) No results for input(s): PROBNP in the last 8760 hours. HbA1C: Recent Labs    02/18/19 0432  HGBA1C 7.6*   CBG: Recent Labs  Lab 02/18/19 1625 02/18/19 1859 02/18/19 2154 02/19/19 0612 02/19/19 1121  GLUCAP 209* 121* 94 127* 154*   Lipid Profile: No results for input(s): CHOL, HDL, LDLCALC, TRIG, CHOLHDL, LDLDIRECT in the last 72 hours. Thyroid Function Tests: No results for input(s): TSH, T4TOTAL, FREET4, T3FREE, THYROIDAB in the last 72 hours. Anemia Panel: No results for input(s): VITAMINB12, FOLATE, FERRITIN, TIBC, IRON, RETICCTPCT in the last 72 hours. Sepsis Labs: No results for input(s): PROCALCITON, LATICACIDVEN in the last 168 hours.  Recent Results (from the past 240 hour(s))   SARS CORONAVIRUS 2 (TAT 6-24 HRS) Nasopharyngeal Nasopharyngeal Swab     Status: None   Collection Time: 02/18/19 12:47 AM   Specimen: Nasopharyngeal Swab  Result Value Ref Range Status   SARS Coronavirus 2 NEGATIVE NEGATIVE Final    Comment: (NOTE) SARS-CoV-2 target nucleic acids are NOT DETECTED. The SARS-CoV-2 RNA is generally detectable in upper and lower respiratory specimens during the acute phase of infection. Negative results do not preclude SARS-CoV-2 infection, do not rule out co-infections with other pathogens, and should not be used as the sole basis for treatment or other patient management decisions. Negative results must be combined with clinical observations, patient history, and epidemiological information. The expected result is Negative. Fact Sheet for Patients: SugarRoll.be Fact Sheet for Healthcare Providers: https://www.woods-mathews.com/ This test is not yet approved or cleared by the [place] FDA and  has been authorized for detection and/or diagnosis of SARS-CoV-2 by FDA under an Emergency Use Authorization (EUA). This EUA will remain  in effect (meaning this test can be used) for the duration of the COVID-19 declaration  under Section 56 4(b)(1) of the Act, 21 U.S.C. section 360bbb-3(b)(1), unless the authorization is terminated or revoked sooner. Performed at [facility], [address] [place], [place] [address]      Radiology Studies: US Renal  Result Date: 02/18/2019 CLINICAL DATA:  Acute kidney injury. EXAM: RENAL / URINARY TRACT ULTRASOUND COMPLETE COMPARISON:  None. FINDINGS: Right Kidney: Renal measurements: 10.8 x 5.0 x 4.4 cm = volume: 123 mL . Echogenicity within normal limits. No mass or hydronephrosis visualized. Left Kidney: Renal measurements: 10.3 x 5.8 x 5.7 cm = volume: 177 mL. Two simple cysts are noted, the largest measuring 1.9 cm echogenicity within normal limits. No mass or  hydronephrosis visualized. Bladder: Appears normal for degree of bladder distention. Other: None. IMPRESSION: Two simple left renal cysts.  No other renal abnormality is noted. Electronically Signed   By: Lupita Raider M.D.   On: 02/18/2019 09:47    Scheduled Meds: . allopurinol  100 mg Oral Daily  . amLODipine  10 mg Oral Daily  . carvedilol  12.5 mg Oral BID WC  . cycloSPORINE  1 drop Both Eyes BID  . heparin  5,000 Units Subcutaneous Q8H  . insulin aspart  0-5 Units Subcutaneous QHS  . insulin aspart  0-9 Units Subcutaneous TID WC  . sodium bicarbonate  650 mg Oral BID  . sodium polystyrene  30 g Oral Once   Continuous Infusions: . sodium chloride 100 mL/hr at 02/19/19 7782     LOS: 1 day   Rickey Barbara, MD [facility] Pager On Amion  If 7PM-7AM, please contact night-coverage 02/19/2019, 1:51 PM

## 2019-02-20 LAB — COMPREHENSIVE METABOLIC PANEL
ALT: 14 U/L (ref 0–44)
AST: 13 U/L — ABNORMAL LOW (ref 15–41)
Albumin: 3.4 g/dL — ABNORMAL LOW (ref 3.5–5.0)
Alkaline Phosphatase: 50 U/L (ref 38–126)
Anion gap: 5 (ref 5–15)
BUN: 29 mg/dL — ABNORMAL HIGH (ref 8–23)
CO2: 15 mmol/L — ABNORMAL LOW (ref 22–32)
Calcium: 8.9 mg/dL (ref 8.9–10.3)
Chloride: 122 mmol/L — ABNORMAL HIGH (ref 98–111)
Creatinine, Ser: 1.47 mg/dL — ABNORMAL HIGH (ref 0.44–1.00)
GFR calc Af Amer: 42 mL/min — ABNORMAL LOW (ref >60)
GFR calc non Af Amer: 36 mL/min — ABNORMAL LOW (ref >60)
Glucose, Bld: 117 mg/dL — ABNORMAL HIGH (ref 70–99)
Potassium: 5.2 mmol/L — ABNORMAL HIGH (ref 3.5–5.1)
Sodium: 142 mmol/L (ref 135–145)
Total Bilirubin: 0.3 mg/dL (ref 0.3–1.2)
Total Protein: 6.2 g/dL — ABNORMAL LOW (ref 6.5–8.1)

## 2019-02-20 LAB — CBC
HCT: 32.5 % — ABNORMAL LOW (ref 36.0–46.0)
Hemoglobin: 10.3 g/dL — ABNORMAL LOW (ref 12.0–15.0)
MCH: 28 pg (ref 26.0–34.0)
MCHC: 31.7 g/dL (ref 30.0–36.0)
MCV: 88.3 fL (ref 80.0–100.0)
Platelets: 233 10*3/uL (ref 150–400)
RBC: 3.68 MIL/uL — ABNORMAL LOW (ref 3.87–5.11)
RDW: 13.1 % (ref 11.5–15.5)
WBC: 4 10*3/uL (ref 4.0–10.5)
nRBC: 0 % (ref 0.0–0.2)

## 2019-02-20 LAB — GLUCOSE, CAPILLARY
Glucose-Capillary: 138 mg/dL — ABNORMAL HIGH (ref 70–99)
Glucose-Capillary: 140 mg/dL — ABNORMAL HIGH (ref 70–99)

## 2019-02-20 MED ORDER — BLOOD GLUCOSE MONITOR KIT: PACK | 0 refills | Status: DC

## 2019-02-20 MED ORDER — SITAGLIPTIN PHOSPHATE 50 MG PO TABS: 50.0000 mg | ORAL_TABLET | Freq: Every day | ORAL | 0 refills | Status: DC

## 2019-02-20 MED ORDER — COENZYME Q10 30 MG PO CAPS: 30.0000 mg | ORAL_CAPSULE | Freq: Three times a day (TID) | ORAL | 0 refills | Status: AC

## 2019-02-20 MED FILL — JANUVIA 50 MG TABLET: 50 | 30 days supply | Qty: 30 | Fill #0

## 2019-02-20 NOTE — Discharge Summary (Signed)
Physician Discharge Summary  Mariah Mason GMW:[id number] DOB: 1950-11-09 DOA: 02/17/2019  PCP: [facility]  Admit date: 02/17/2019 Discharge date: 02/20/2019  Admitted From: Home Disposition:  Home  Recommendations for Outpatient Follow-up:  1. Follow up with PCP in 1-2 weeks 2. Recommend repeat BMET within one week  Discharge Condition:Improved CODE STATUS:Full Diet recommendation: Diabetic   Brief/Interim Summary: 68 y.o.femalewith medical history significant ofnon-insulin-dependent diabetes mellitus, hypertension, hyperlipidemia, gout presented to the ED with complaints of hypoglycemia, decreased appetite, and generalized weakness.Patient states she has not been feeling well for the past few days. She has felt nauseous but has not vomited. No abdominal pain or diarrhea. She has not been eating much due to nausea. She continues to take her diabetes medications including Metformin and glipizide. States her blood sugar was low at home which led her to come into the emergency room to be evaluated. Denies fevers, chills, chest pain, or shortness of breath. Denies dysuria. States her nausea has now resolved and she was able to eat food in the ED without any problems.  ED Course:Initial CBG 44. She was given orange juice and food. Continued to be hypoglycemic and started on D5-normal saline infusion. Potassium 6.4. Received medical treatment for hyperkalemia including sodium bicarbonate and Lokelma. Bicarb 11, anion gap 9. BUN 68, creatinine 4.0. No prior baseline  Discharge Diagnoses:  Principal Problem:   Hypoglycemia Active Problems:   Type 2 diabetes mellitus (HCC)   AKI (acute kidney injury) (HCC)   Hyperkalemia   Metabolic acidosis  Hypoglycemia, non-insulin-dependent diabetes mellitus -Presenting hypoglycemia likely due to glipizide use in the setting of poor oral intake for the past few days.  -Initial CBG 44 in the ED but  continued to be hypoglycemic despite receiving orange juice and food.  -Glucose now improved with d5 fluids -Will continue to hold glipizide and other hypoglycemic agents -Will discharge pt on Januvia given renal function  AKI -Presenting BUN 68, creatinine 4.0. N -Most recent prior Cr from 1/12 noted to be 0.72 -Likely prerenal due to severe dehydration and home ACE inhibitor and diuretic use.  -Continued with IVF hydration, although will decrease to 75cc/hr -Renal ultrasound reviewed personally, unremarkable -Continues with very good urine output -Continue to avoid nephrotoxic agents/contrast -Cr now improving -Recommend repeat BMET within one week  Hyperkalemia in the setting of AKI -Potassium 6.4 at presentation -Pt given sodium bicarbonate and Lokelma in the ED and one dose of kayexelate on the floor -Normalized by time of c/c  Normal anion gap metabolic acidosis -Bicarb 11, anion gap 9 at time of presentation -Serum bicarb improved with hydration, remains at 14 -Given po sodium bicarb -Improved  Discharge Instructions   Allergies as of 02/20/2019      Reactions   Erythromycin Other (See Comments)      Medication List    STOP taking these medications   benazepril 40 MG tablet Commonly known as: LOTENSIN   furosemide 20 MG tablet Commonly known as: LASIX   glipiZIDE 10 MG tablet Commonly known as: GLUCOTROL   metFORMIN 1000 MG tablet Commonly known as: GLUCOPHAGE   spironolactone 25 MG tablet Commonly known as: ALDACTONE     TAKE these medications   allopurinol 100 MG tablet Commonly known as: ZYLOPRIM Take 100 mg by mouth daily.   amLODipine 10 MG tablet Commonly known as: NORVASC Take 10 mg by mouth daily.   blood glucose meter kit and supplies Kit Dispense based on patient and insurance preference. Use up to  four times daily as directed. (FOR ICD-9 250.00, 250.01).   carvedilol 12.5 MG tablet Commonly known as: COREG Take 12.5 mg by  mouth 2 (two) times daily with a meal.   CENTRUM SILVER ADULT 50+ PO Take 1 tablet by mouth daily.   cholecalciferol 25 MCG (1000 UT) tablet Commonly known as: VITAMIN D3 Take 1,000 Units by mouth daily.   co-enzyme Q-10 30 MG capsule Take 1 capsule (30 mg total) by mouth 3 (three) times daily.   colchicine 0.6 MG tablet Take 0.6 mg by mouth daily as needed (gout).   fluticasone 50 MCG/ACT nasal spray Commonly known as: FLONASE Place 1 spray into both nostrils daily as needed for allergies.   pravastatin 10 MG tablet Commonly known as: PRAVACHOL Take 10 mg by mouth daily.   Restasis 0.05 % ophthalmic emulsion Generic drug: cycloSPORINE Place 1 drop into both eyes 2 (two) times daily.   sitaGLIPtin 50 MG tablet Commonly known as: Januvia Take 1 tablet (50 mg total) by mouth daily.      Follow-up Information    [facility] Go on 03/17/2019.   Why: @ 9:30a Contact information: [place] [phone]         Allergies  Allergen Reactions  . Erythromycin Other (See Comments)   Procedures/Studies: US Renal  Result Date: 02/18/2019 CLINICAL DATA:  Acute kidney injury. EXAM: RENAL / URINARY TRACT ULTRASOUND COMPLETE COMPARISON:  None. FINDINGS: Right Kidney: Renal measurements: 10.8 x 5.0 x 4.4 cm = volume: 123 mL . Echogenicity within normal limits. No mass or hydronephrosis visualized. Left Kidney: Renal measurements: 10.3 x 5.8 x 5.7 cm = volume: 177 mL. Two simple cysts are noted, the largest measuring 1.9 cm echogenicity within normal limits. No mass or hydronephrosis visualized. Bladder: Appears normal for degree of bladder distention. Other: None. IMPRESSION: Two simple left renal cysts.  No other renal abnormality is noted. Electronically Signed   By: Marijo Conception M.D.   On: 02/18/2019 09:47     Subjective: Eager to go home today  Discharge Exam: Vitals:   02/19/19 2009  02/20/19 0613  BP: 136/60 136/63  Pulse: 86 87  Resp: 19 20  Temp: 98 F (36.7 C) 98.4 F (36.9 C)  SpO2: 100% 100%   Vitals:   02/19/19 1123 02/19/19 2009 02/20/19 0613 02/20/19 0713  BP: (!) 152/72 136/60 136/63   Pulse: 80 86 87   Resp: 20 19 20    Temp: 98.4 F (36.9 C) 98 F (36.7 C) 98.4 F (36.9 C)   TempSrc: Oral Oral Oral   SpO2: 100% 100% 100%   Weight:    92.4 kg  Height:        General: Pt is alert, awake, not in acute distress Cardiovascular: RRR, S1/S2 +, no rubs, no gallops Respiratory: CTA bilaterally, no wheezing, no rhonchi Abdominal: Soft, NT, ND, bowel sounds + Extremities: no edema, no cyanosis   The results of significant diagnostics from this hospitalization (including imaging, microbiology, ancillary and laboratory) are listed below for reference.     Microbiology: Recent Results (from the past 240 hour(s))  SARS CORONAVIRUS 2 (TAT 6-24 HRS) Nasopharyngeal Nasopharyngeal Swab     Status: None   Collection Time: 02/18/19 12:47 AM   Specimen: Nasopharyngeal Swab  Result Value Ref Range Status   SARS Coronavirus 2 NEGATIVE NEGATIVE Final    Comment: (NOTE) SARS-CoV-2 target nucleic acids are NOT DETECTED. The SARS-CoV-2 RNA is generally detectable  in upper and lower respiratory specimens during the acute phase of infection. Negative results do not preclude SARS-CoV-2 infection, do not rule out co-infections with other pathogens, and should not be used as the sole basis for treatment or other patient management decisions. Negative results must be combined with clinical observations, patient history, and epidemiological information. The expected result is Negative. Fact Sheet for Patients: SugarRoll.be Fact Sheet for Healthcare Providers: https://www.woods-mathews.com/ This test is not yet approved or cleared by the [place] FDA and  has been authorized for detection and/or diagnosis of SARS-CoV-2  by FDA under an Emergency Use Authorization (EUA). This EUA will remain  in effect (meaning this test can be used) for the duration of the COVID-19 declaration under Section 56 4(b)(1) of the Act, 21 U.S.C. section 360bbb-3(b)(1), unless the authorization is terminated or revoked sooner. Performed at [facility], [place] [address] [place], [place] [address]      Labs: BNP (last 3 results) No results for input(s): BNP in the last 8760 hours. Basic Metabolic Panel: Recent Labs  Lab 02/17/19 2135 02/18/19 0432 02/19/19 0423 02/20/19 0433  NA 132* 133* 139 142  K 6.4* 5.9* 6.0* 5.2*  CL 112* 112* 121* 122*  CO2 11* 14* 14* 15*  GLUCOSE 55* 173* 142* 117*  BUN 68* 63* 47* 29*  CREATININE 4.09* 4.11* 2.37* 1.47*  CALCIUM 9.4 8.8* 8.6* 8.9   Liver Function Tests: Recent Labs  Lab 02/17/19 2135 02/19/19 0423 02/20/19 0433  AST 17 12* 13*  ALT 16 13 14   ALKPHOS 58 53 50  BILITOT 0.4 0.6 0.3  PROT 7.9 6.3* 6.2*  ALBUMIN 4.5 3.5 3.4*   No results for input(s): LIPASE, AMYLASE in the last 168 hours. No results for input(s): AMMONIA in the last 168 hours. CBC: Recent Labs  Lab 02/17/19 2135 02/19/19 0423 02/20/19 0433  WBC 6.5 3.9* 4.0  NEUTROABS 4.7  --   --   HGB 12.2 10.5* 10.3*  HCT 39.4 32.8* 32.5*  MCV 90.4 87.7 88.3  PLT 272 212 233   Cardiac Enzymes: No results for input(s): CKTOTAL, CKMB, CKMBINDEX, TROPONINI in the last 168 hours. BNP: Invalid input(s): POCBNP CBG: Recent Labs  Lab 02/19/19 0612 02/19/19 1121 02/19/19 1618 02/19/19 2140 02/20/19 0617  GLUCAP 127* 154* 117* 129* 138*   D-Dimer No results for input(s): DDIMER in the last 72 hours. Hgb A1c Recent Labs    02/18/19 0432  HGBA1C 7.6*   Lipid Profile No results for input(s): CHOL, HDL, LDLCALC, TRIG, CHOLHDL, LDLDIRECT in the last 72 hours. Thyroid function studies No results for input(s): TSH, T4TOTAL, T3FREE, THYROIDAB in the last 72 hours.  Invalid input(s):  FREET3 Anemia work up No results for input(s): VITAMINB12, FOLATE, FERRITIN, TIBC, IRON, RETICCTPCT in the last 72 hours. Urinalysis    Component Value Date/Time   COLORURINE STRAW (A) 02/18/2019 0753   APPEARANCEUR CLEAR 02/18/2019 0753   LABSPEC 1.005 02/18/2019 0753   PHURINE 5.0 02/18/2019 0753   GLUCOSEU NEGATIVE 02/18/2019 0753   HGBUR NEGATIVE 02/18/2019 0753   BILIRUBINUR NEGATIVE 02/18/2019 0753   KETONESUR NEGATIVE 02/18/2019 0753   PROTEINUR NEGATIVE 02/18/2019 0753   NITRITE NEGATIVE 02/18/2019 0753   LEUKOCYTESUR NEGATIVE 02/18/2019 0753   Sepsis Labs Invalid input(s): PROCALCITONIN,  WBC,  LACTICIDVEN Microbiology Recent Results (from the past 240 hour(s))  SARS CORONAVIRUS 2 (TAT 6-24 HRS) Nasopharyngeal Nasopharyngeal Swab     Status: None   Collection Time: 02/18/19 12:47 AM   Specimen: Nasopharyngeal Swab  Result Value Ref Range Status   SARS Coronavirus 2 NEGATIVE NEGATIVE Final    Comment: (NOTE) SARS-CoV-2 target nucleic acids are NOT DETECTED. The SARS-CoV-2 RNA is generally detectable in upper and lower respiratory specimens during the acute phase of infection. Negative results do not preclude SARS-CoV-2 infection, do not rule out co-infections with other pathogens, and should not be used as the sole basis for treatment or other patient management decisions. Negative results must be combined with clinical observations, patient history, and epidemiological information. The expected result is Negative. Fact Sheet for Patients: SugarRoll.be Fact Sheet for Healthcare Providers: https://www.woods-mathews.com/ This test is not yet approved or cleared by the [place] FDA and  has been authorized for detection and/or diagnosis of SARS-CoV-2 by FDA under an Emergency Use Authorization (EUA). This EUA will remain  in effect (meaning this test can be used) for the duration of the COVID-19 declaration under Section  56 4(b)(1) of the Act, 21 U.S.C. section 360bbb-3(b)(1), unless the authorization is terminated or revoked sooner. Performed at [facility], [place] [address] [place], [place] [address]    Time spent: 77mn  SIGNED:   SMarylu Lund MD  [facility] 02/20/2019, 11:51 AM  If 7PM-7AM, please contact night-coverage

## 2019-03-17 ENCOUNTER — Other Ambulatory Visit: Payer: Self-pay

## 2019-03-17 ENCOUNTER — Ambulatory Visit: Payer: Medicare Other | Attending: Family Medicine | Admitting: Family Medicine

## 2019-03-17 ENCOUNTER — Encounter: Payer: Self-pay | Admitting: Family Medicine

## 2019-03-17 VITALS — BP 140/68 | HR 85 | Temp 98.0°F | Ht 62.0 in | Wt 207.0 lb

## 2019-03-17 DIAGNOSIS — M170 Bilateral primary osteoarthritis of knee: Secondary | ICD-10-CM | POA: Insufficient documentation

## 2019-03-17 DIAGNOSIS — M1A00X0 Idiopathic chronic gout, unspecified site, without tophus (tophi): Secondary | ICD-10-CM | POA: Insufficient documentation

## 2019-03-17 DIAGNOSIS — Z7901 Long term (current) use of anticoagulants: Secondary | ICD-10-CM | POA: Diagnosis not present

## 2019-03-17 DIAGNOSIS — Z79899 Other long term (current) drug therapy: Secondary | ICD-10-CM | POA: Diagnosis not present

## 2019-03-17 DIAGNOSIS — Z794 Long term (current) use of insulin: Secondary | ICD-10-CM | POA: Diagnosis not present

## 2019-03-17 DIAGNOSIS — E785 Hyperlipidemia, unspecified: Secondary | ICD-10-CM | POA: Diagnosis not present

## 2019-03-17 DIAGNOSIS — I10 Essential (primary) hypertension: Secondary | ICD-10-CM | POA: Diagnosis not present

## 2019-03-17 DIAGNOSIS — E1169 Type 2 diabetes mellitus with other specified complication: Secondary | ICD-10-CM | POA: Insufficient documentation

## 2019-03-17 DIAGNOSIS — M109 Gout, unspecified: Secondary | ICD-10-CM | POA: Insufficient documentation

## 2019-03-17 LAB — GLUCOSE, POCT (MANUAL RESULT ENTRY): POC Glucose: 227 mg/dl — AB (ref 70–99)

## 2019-03-17 MED ORDER — AMLODIPINE BESYLATE 10 MG PO TABS: 10.0000 mg | ORAL_TABLET | Freq: Every day | ORAL | 6 refills | Status: DC

## 2019-03-17 MED ORDER — ALLOPURINOL 100 MG PO TABS: 100.0000 mg | ORAL_TABLET | Freq: Every day | ORAL | 6 refills | Status: DC

## 2019-03-17 MED ORDER — SITAGLIPTIN PHOSPHATE 50 MG PO TABS: 50.0000 mg | ORAL_TABLET | Freq: Every day | ORAL | 6 refills | Status: DC

## 2019-03-17 MED ORDER — ATORVASTATIN CALCIUM 20 MG PO TABS: 20.0000 mg | ORAL_TABLET | Freq: Every day | ORAL | 6 refills | Status: DC

## 2019-03-17 MED ORDER — COLCHICINE 0.6 MG PO TABS: ORAL_TABLET | ORAL | 1 refills | Status: DC

## 2019-03-17 MED ORDER — CARVEDILOL 12.5 MG PO TABS: 12.5000 mg | ORAL_TABLET | Freq: Two times a day (BID) | ORAL | 6 refills | Status: DC

## 2019-03-17 MED ORDER — TRAMADOL HCL 50 MG PO TABS: 50.0000 mg | ORAL_TABLET | Freq: Every evening | ORAL | 1 refills | Status: AC | PRN

## 2019-03-17 NOTE — Patient Instructions (Signed)

## 2019-03-17 NOTE — Progress Notes (Signed)
Patient would like to discuss her Arthritis.

## 2019-03-17 NOTE — Progress Notes (Signed)
Subjective:  Patient ID: Mariah Mason, female    DOB: 03/17/1951  Age: 68 y.o. MRN: [id number]  CC: Hospitalization Follow-up   HPI Mariah Mason is a 68 year old female with a history of hypertension, type 2 diabetes mellitus (A1c 7.6) previously followed by [facility] who presents today for hospital follow-up after hospitalization for hypoglycemia at [facility] from 02/17/2019 through 02/20/2019. She had presented with a CBG of 44 in the setting of poor oral intake, hyperkalemia of 6.4, creatinine of 4.0 and Metformin and glipizide were discontinued and IV fluids administered. Januvia was commenced at discharge and creatinine improved to 1.27.  She presents today accompanied by her daughter and would like to establish care here.  Her blood sugar log reveals fasting sugars of 149, 150.  She has been compliant with Januvia.  Denies any additional hypoglycemic episode. She is concerned about stiffness in her knees and pain rated as a 10/10 which is worse with the cold weather.  She has no pain medications for this. She is compliant with her antihypertensive. Her Gout has been stable with no flares.   Past Medical History:  Diagnosis Date  . Diabetes mellitus without complication ([place])   . Gout   . Hyperlipemia   . Hypertension     History reviewed. No pertinent surgical history.  History reviewed. No pertinent family history.  Allergies  Allergen Reactions  . Erythromycin Other (See Comments)    Outpatient Medications Prior to Visit  Medication Sig Dispense Refill  . blood glucose meter kit and supplies KIT Dispense based on patient and insurance preference. Use up to four times daily as directed. (FOR ICD-9 250.00, 250.01). 1 each 0  . cholecalciferol (VITAMIN D3) 25 MCG (1000 UT) tablet Take 1,000 Units by mouth daily.    Marland Kitchen co-enzyme Q-10 30 MG capsule Take 1 capsule (30 mg total) by mouth 3 (three) times daily. 90 capsule 0  . cycloSPORINE  (RESTASIS) 0.05 % ophthalmic emulsion Place 1 drop into both eyes 2 (two) times daily.     . fluticasone (FLONASE) 50 MCG/ACT nasal spray Place 1 spray into both nostrils daily as needed for allergies.    . Multiple Vitamins-Minerals (CENTRUM SILVER ADULT 50+ PO) Take 1 tablet by mouth daily.    Marland Kitchen allopurinol (ZYLOPRIM) 100 MG tablet Take 100 mg by mouth daily.     Marland Kitchen amLODipine (NORVASC) 10 MG tablet Take 10 mg by mouth daily.     . carvedilol (COREG) 12.5 MG tablet Take 12.5 mg by mouth 2 (two) times daily with a meal.     . colchicine 0.6 MG tablet Take 0.6 mg by mouth daily as needed (gout).     . pravastatin (PRAVACHOL) 10 MG tablet Take 10 mg by mouth daily.    . sitaGLIPtin (JANUVIA) 50 MG tablet Take 1 tablet (50 mg total) by mouth daily. 30 tablet 0   No facility-administered medications prior to visit.      ROS Review of Systems  Constitutional: Negative for activity change, appetite change and fatigue.  HENT: Negative for congestion, sinus pressure and sore throat.   Eyes: Negative for visual disturbance.  Respiratory: Negative for cough, chest tightness, shortness of breath and wheezing.   Cardiovascular: Negative for chest pain and palpitations.  Gastrointestinal: Negative for abdominal distention, abdominal pain and constipation.  Endocrine: Negative for polydipsia.  Genitourinary: Negative for dysuria and frequency.  Musculoskeletal: Negative for arthralgias and back pain.  Skin: Negative for rash.  Neurological: Negative  for tremors, light-headedness and numbness.  Hematological: Does not bruise/bleed easily.  Psychiatric/Behavioral: Negative for agitation and behavioral problems.    Objective:  BP 140/68   Pulse 85   Temp 98 F (36.7 C) (Oral)   Ht 5' 2"  (1.575 m)   Wt 207 lb (93.9 kg)   SpO2 98%   BMI 37.86 kg/m   BP/Weight 03/17/2019 51/05/5850 11/03/8240  Systolic BP 353 614 431  Diastolic BP 68 63 79  Wt. (Lbs) 207 203.7 225  BMI 37.86 37.26 41.15       Physical Exam Constitutional:      Appearance: She is well-developed.  Neck:     Vascular: No JVD.  Cardiovascular:     Rate and Rhythm: Normal rate.     Heart sounds: Normal heart sounds. No murmur.  Pulmonary:     Effort: Pulmonary effort is normal.     Breath sounds: Normal breath sounds. No wheezing or rales.  Chest:     Chest wall: No tenderness.  Abdominal:     General: Bowel sounds are normal. There is no distension.     Palpations: Abdomen is soft. There is no mass.     Tenderness: There is no abdominal tenderness.  Musculoskeletal: Normal range of motion.     Right lower leg: No edema.     Left lower leg: No edema.  Neurological:     Mental Status: She is alert and oriented to person, place, and time.  Psychiatric:        Mood and Affect: Mood normal.     CMP Latest Ref Rng & Units 02/20/2019 02/19/2019 02/18/2019  Glucose 70 - 99 mg/dL 117(H) 142(H) 173(H)  BUN 8 - 23 mg/dL 29(H) 47(H) 63(H)  Creatinine 0.44 - 1.00 mg/dL 1.47(H) 2.37(H) 4.11(H)  Sodium 135 - 145 mmol/L 142 139 133(L)  Potassium 3.5 - 5.1 mmol/L 5.2(H) 6.0(H) 5.9(H)  Chloride 98 - 111 mmol/L 122(H) 121(H) 112(H)  CO2 22 - 32 mmol/L 15(L) 14(L) 14(L)  Calcium 8.9 - 10.3 mg/dL 8.9 8.6(L) 8.8(L)  Total Protein 6.5 - 8.1 g/dL 6.2(L) 6.3(L) -  Total Bilirubin 0.3 - 1.2 mg/dL 0.3 0.6 -  Alkaline Phos 38 - 126 U/L 50 53 -  AST 15 - 41 U/L 13(L) 12(L) -  ALT 0 - 44 U/L 14 13 -    Lipid Panel  No results found for: CHOL, TRIG, HDL, CHOLHDL, VLDL, LDLCALC, LDLDIRECT  CBC    Component Value Date/Time   WBC 4.0 02/20/2019 0433   RBC 3.68 (L) 02/20/2019 0433   HGB 10.3 (L) 02/20/2019 0433   HCT 32.5 (L) 02/20/2019 0433   PLT 233 02/20/2019 0433   MCV 88.3 02/20/2019 0433   MCH 28.0 02/20/2019 0433   MCHC 31.7 02/20/2019 0433   RDW 13.1 02/20/2019 0433   LYMPHSABS 1.3 02/17/2019 2135   MONOABS 0.4 02/17/2019 2135   EOSABS 0.1 02/17/2019 2135   BASOSABS 0.0 02/17/2019 2135    Lab Results   Component Value Date   HGBA1C 7.6 (H) 02/18/2019    Assessment & Plan:   1. Type 2 diabetes mellitus with other specified complication, with long-term current use of insulin (HCC) Controlled with A1c of 7.6; goal is less than 7.5 due to age Previously on Metformin and glipizide which were discontinued during hospitalization She is not open to commencing an injectable and Metformin contraindicated due to renal function Blood sugar logs at the moment reveal sugars well controlled If A1c trends up at next visit  we will consider commencing low-dose of glipizide - Glucose (CBG) - sitaGLIPtin (JANUVIA) 50 MG tablet; Take 1 tablet (50 mg total) by mouth daily.  Dispense: 30 tablet; Refill: 6 - atorvastatin (LIPITOR) 20 MG tablet; Take 1 tablet (20 mg total) by mouth daily.  Dispense: 30 tablet; Refill: 6 - Microalbumin/Creatinine Ratio, Urine - Complete Metabolic Panel with GFR  2. Idiopathic chronic gout without tophus, unspecified site Stable - allopurinol (ZYLOPRIM) 100 MG tablet; Take 1 tablet (100 mg total) by mouth daily.  Dispense: 30 tablet; Refill: 6 - colchicine 0.6 MG tablet; Take 2 tablets (1.2 mg) at the onset of a gout attack, may repeat 1 tablet (0.6 mg) in 2 hours if symptoms persist  Dispense: 30 tablet; Refill: 1  3. Essential hypertension Controlled - amLODipine (NORVASC) 10 MG tablet; Take 1 tablet (10 mg total) by mouth daily.  Dispense: 30 tablet; Refill: 6 - carvedilol (COREG) 12.5 MG tablet; Take 1 tablet (12.5 mg total) by mouth 2 (two) times daily with a meal.  Dispense: 60 tablet; Refill: 6  4. Primary osteoarthritis of both knees Unable to use NSAIDs due to renal function - traMADol (ULTRAM) 50 MG tablet; Take 1 tablet (50 mg total) by mouth at bedtime as needed for up to 1 day.  Dispense: 30 tablet; Refill: 1    Meds ordered this encounter  Medications  . traMADol (ULTRAM) 50 MG tablet    Sig: Take 1 tablet (50 mg total) by mouth at bedtime as needed for  up to 1 day.    Dispense:  30 tablet    Refill:  1  . allopurinol (ZYLOPRIM) 100 MG tablet    Sig: Take 1 tablet (100 mg total) by mouth daily.    Dispense:  30 tablet    Refill:  6  . amLODipine (NORVASC) 10 MG tablet    Sig: Take 1 tablet (10 mg total) by mouth daily.    Dispense:  30 tablet    Refill:  6  . carvedilol (COREG) 12.5 MG tablet    Sig: Take 1 tablet (12.5 mg total) by mouth 2 (two) times daily with a meal.    Dispense:  60 tablet    Refill:  6  . colchicine 0.6 MG tablet    Sig: Take 2 tablets (1.2 mg) at the onset of a gout attack, may repeat 1 tablet (0.6 mg) in 2 hours if symptoms persist    Dispense:  30 tablet    Refill:  1  . sitaGLIPtin (JANUVIA) 50 MG tablet    Sig: Take 1 tablet (50 mg total) by mouth daily.    Dispense:  30 tablet    Refill:  6  . atorvastatin (LIPITOR) 20 MG tablet    Sig: Take 1 tablet (20 mg total) by mouth daily.    Dispense:  30 tablet    Refill:  6    Follow-up: Return in about 3 months (around 06/17/2019) for medical condiitons.       Charlott Rakes, MD, FAAFP. [facility] [place], [place]   03/17/2019, 11:36 AM

## 2019-03-18 LAB — CMP14+EGFR
ALT: 14 IU/L (ref 0–32)
AST: 14 IU/L (ref 0–40)
Albumin/Globulin Ratio: 1.2 (ref 1.2–2.2)
Albumin: 4.1 g/dL (ref 3.8–4.8)
Alkaline Phosphatase: 110 IU/L (ref 39–117)
BUN/Creatinine Ratio: 16 (ref 12–28)
BUN: 20 mg/dL (ref 8–27)
Bilirubin Total: 0.3 mg/dL (ref 0.0–1.2)
CO2: 22 mmol/L (ref 20–29)
Calcium: 9.7 mg/dL (ref 8.7–10.3)
Chloride: 101 mmol/L (ref 96–106)
Creatinine, Ser: 1.27 mg/dL — ABNORMAL HIGH (ref 0.57–1.00)
GFR calc Af Amer: 50 mL/min/{1.73_m2} — ABNORMAL LOW (ref >59)
GFR calc non Af Amer: 43 mL/min/{1.73_m2} — ABNORMAL LOW (ref >59)
Globulin, Total: 3.3 g/dL (ref 1.5–4.5)
Glucose: 227 mg/dL — ABNORMAL HIGH (ref 65–99)
Potassium: 4.7 mmol/L (ref 3.5–5.2)
Sodium: 139 mmol/L (ref 134–144)
Total Protein: 7.4 g/dL (ref 6.0–8.5)

## 2019-03-18 LAB — MICROALBUMIN / CREATININE URINE RATIO
Creatinine, Urine: 50.3 mg/dL
Microalb/Creat Ratio: 309 mg/g creat — ABNORMAL HIGH (ref 0–29)
Microalbumin, Urine: 155.2 ug/mL

## 2019-03-20 ENCOUNTER — Telehealth: Payer: Self-pay

## 2019-03-20 NOTE — Telephone Encounter (Signed)
-----   Message from Charlott Rakes, MD sent at 03/19/2019  8:28 AM EST ----- Labs are stable

## 2019-03-20 NOTE — Telephone Encounter (Signed)
Patient name and DOB has been verified Patient was informed of lab results. Patient had no questions.  

## 2019-04-02 ENCOUNTER — Telehealth: Payer: Self-pay | Admitting: Family Medicine

## 2019-04-02 NOTE — Telephone Encounter (Signed)
Patient is requesting a different medication for her Cholesterol.

## 2019-04-02 NOTE — Telephone Encounter (Signed)
Medication is causing muscles to "pull" in legs and heart to flutter. Says it is the cholesterol medication. Please contact patient to change medication.

## 2019-04-03 MED ORDER — PRAVASTATIN SODIUM 40 MG PO TABS: 40.0000 mg | ORAL_TABLET | Freq: Every day | ORAL | 3 refills | Status: DC

## 2019-04-03 NOTE — Telephone Encounter (Signed)
I have changed atorvastatin to pravastatin.

## 2019-04-03 NOTE — Telephone Encounter (Signed)
She can discontinue it and use Omega 3 fish oil caps. Thanks

## 2019-04-03 NOTE — Telephone Encounter (Signed)
Patient was called to be informed of medication change.   Patient states that she was referring to the pravastatin when she called not the atorvastatin. Patient states that both medications make her legs feel like they a re pulling.

## 2019-04-06 NOTE — Telephone Encounter (Signed)
Patient was called and informed of medication change  

## 2019-05-12 ENCOUNTER — Emergency Department (HOSPITAL_BASED_OUTPATIENT_CLINIC_OR_DEPARTMENT_OTHER)
Admission: EM | Admit: 2019-05-12 | Discharge: 2019-05-12 | Disposition: A | Discharge status: Home / Self Care | Payer: Medicare Other | Attending: Emergency Medicine | Admitting: Emergency Medicine

## 2019-05-12 ENCOUNTER — Other Ambulatory Visit: Payer: Self-pay

## 2019-05-12 ENCOUNTER — Encounter (HOSPITAL_BASED_OUTPATIENT_CLINIC_OR_DEPARTMENT_OTHER): Payer: Self-pay | Admitting: Emergency Medicine

## 2019-05-12 DIAGNOSIS — E119 Type 2 diabetes mellitus without complications: Secondary | ICD-10-CM | POA: Diagnosis not present

## 2019-05-12 DIAGNOSIS — I10 Essential (primary) hypertension: Secondary | ICD-10-CM | POA: Insufficient documentation

## 2019-05-12 DIAGNOSIS — M79672 Pain in left foot: Secondary | ICD-10-CM | POA: Diagnosis present

## 2019-05-12 DIAGNOSIS — Z79899 Other long term (current) drug therapy: Secondary | ICD-10-CM | POA: Diagnosis not present

## 2019-05-12 DIAGNOSIS — M79671 Pain in right foot: Secondary | ICD-10-CM | POA: Insufficient documentation

## 2019-05-12 MED ORDER — ACETAMINOPHEN 500 MG PO TABS
1000.0000 mg | ORAL_TABLET | Freq: Once | ORAL | Status: AC
  Administered 2019-05-12: 1000 mg via ORAL
  Filled 2019-05-12: qty 2

## 2019-05-12 NOTE — ED Triage Notes (Signed)
Patient complaints of bilateral foot pain; states onset over 1 week ago; States patient in on bottom of her feet; states feels very tight; denies any known injury.

## 2019-05-12 NOTE — ED Provider Notes (Signed)
[facility] EMERGENCY DEPARTMENT Provider Note   CSN: [id number] Arrival date & time: 05/12/19  0542     History Chief Complaint  Patient presents with  . Foot Pain    Mariah Mason is a 69 y.o. female.  The history is provided by the patient.  Foot Pain This is a new problem. The current episode started more than 2 days ago (5 days). The problem occurs constantly. Progression since onset: waxing and waning. Pertinent negatives include no chest pain, no abdominal pain, no headaches and no shortness of breath. Nothing aggravates the symptoms. Nothing relieves the symptoms. Treatments tried: voltaren gel. The treatment provided no relief.  Patient has B foot pain that feels like pulling on the plantar surfaces of B feet.  No trauma. Patient is flat footed.  No wounds.  No weakness no numbness.       Past Medical History:  Diagnosis Date  . Diabetes mellitus without complication ([place])   . Gout   . Hyperlipemia   . Hypertension     Patient Active Problem List   Diagnosis Date Noted  . Gout   . Hypertension   . Type 2 diabetes mellitus ([place]) 02/18/2019  . AKI (acute kidney injury) ([place]) 02/18/2019  . Hyperkalemia 02/18/2019  . Metabolic acidosis 30/86/5784  . Hypoglycemia 02/17/2019    History reviewed. No pertinent surgical history.   OB History   No obstetric history on file.     History reviewed. No pertinent family history.  Social History   Tobacco Use  . Smoking status: Never Smoker  . Smokeless tobacco: Never Used  Substance Use Topics  . Alcohol use: Never  . Drug use: Never    Home Medications Prior to Admission medications   Medication Sig Start Date End Date Taking? Authorizing Provider  allopurinol (ZYLOPRIM) 100 MG tablet Take 1 tablet (100 mg total) by mouth daily. 03/17/19   Charlott Rakes, MD  amLODipine (NORVASC) 10 MG tablet Take 1 tablet (10 mg total) by mouth daily. 03/17/19   Charlott Rakes, MD  blood glucose meter kit and  supplies KIT Dispense based on patient and insurance preference. Use up to four times daily as directed. (FOR ICD-9 250.00, 250.01). 02/20/19   Donne Hazel, MD  carvedilol (COREG) 12.5 MG tablet Take 1 tablet (12.5 mg total) by mouth 2 (two) times daily with a meal. 03/17/19   Charlott Rakes, MD  cholecalciferol (VITAMIN D3) 25 MCG (1000 UT) tablet Take 1,000 Units by mouth daily.    [provider]  colchicine 0.6 MG tablet Take 2 tablets (1.2 mg) at the onset of a gout attack, may repeat 1 tablet (0.6 mg) in 2 hours if symptoms persist 03/17/19   Charlott Rakes, MD  cycloSPORINE (RESTASIS) 0.05 % ophthalmic emulsion Place 1 drop into both eyes 2 (two) times daily.  12/05/18   [provider]  fluticasone (FLONASE) 50 MCG/ACT nasal spray Place 1 spray into both nostrils daily as needed for allergies. 02/27/16   [provider]  Multiple Vitamins-Minerals (CENTRUM SILVER ADULT 50+ PO) Take 1 tablet by mouth daily.    [provider]  pravastatin (PRAVACHOL) 40 MG tablet Take 1 tablet (40 mg total) by mouth daily. 04/03/19   Charlott Rakes, MD  sitaGLIPtin (JANUVIA) 50 MG tablet Take 1 tablet (50 mg total) by mouth daily. 03/17/19 04/16/19  Charlott Rakes, MD    Allergies    Erythromycin  Review of Systems   Review of Systems  Constitutional: Negative for  fever.  HENT: Negative for congestion.   Eyes: Negative for visual disturbance.  Respiratory: Negative for shortness of breath.   Cardiovascular: Negative for chest pain.  Gastrointestinal: Negative for abdominal pain.  Genitourinary: Negative for difficulty urinating.  Musculoskeletal: Positive for arthralgias.  Neurological: Negative for headaches.  Psychiatric/Behavioral: Negative for agitation.  All other systems reviewed and are negative.   Physical Exam Updated Vital Signs BP (!) 163/96 (BP Location: Right Arm)   Pulse 90   Temp 98.4 F (36.9 C) (Oral)   Resp 18   Ht _0  (1.651 m)    Wt 94 kg   SpO2 100%   BMI 34.49 kg/m   Physical Exam Vitals and nursing note reviewed.  Constitutional:      Appearance: Normal appearance.  HENT:     Head: Normocephalic and atraumatic.     Nose: Nose normal.  Eyes:     Conjunctiva/sclera: Conjunctivae normal.     Pupils: Pupils are equal, round, and reactive to light.  Cardiovascular:     Rate and Rhythm: Normal rate and regular rhythm.     Pulses: Normal pulses.     Heart sounds: Normal heart sounds.  Pulmonary:     Effort: Pulmonary effort is normal.     Breath sounds: Normal breath sounds.  Abdominal:     General: Abdomen is flat. Bowel sounds are normal.     Tenderness: There is no abdominal tenderness. There is no guarding.  Musculoskeletal:        General: Normal range of motion.     Cervical back: Normal range of motion and neck supple.     Right ankle: Normal.     Right Achilles Tendon: Normal.     Left ankle: Normal.     Left Achilles Tendon: Normal.     Right foot: Normal range of motion and normal capillary refill. No swelling, deformity, bunion, Charcot foot, foot drop, prominent metatarsal heads, laceration, tenderness, bony tenderness or crepitus. Normal pulse.     Left foot: Normal range of motion and normal capillary refill. No swelling, deformity, bunion, Charcot foot, foot drop, prominent metatarsal heads, laceration, tenderness, bony tenderness or crepitus. Normal pulse.  Skin:    General: Skin is warm and dry.     Capillary Refill: Capillary refill takes less than 2 seconds.  Neurological:     General: No focal deficit present.     Mental Status: She is alert and oriented to person, place, and time.  Psychiatric:        Mood and Affect: Mood normal.        Behavior: Behavior normal.     ED Results / Procedures / Treatments   Labs (all labs ordered are listed, but only abnormal results are displayed) Labs Reviewed - No data to display  EKG None  Radiology No results  found.  Procedures Procedures (including critical care time)  Medications Ordered in ED Medications  acetaminophen (TYLENOL) tablet 1,000 mg (has no administration in time range)    ED Course  I have reviewed the triage vital signs and the nursing notes.  Pertinent labs & imaging results that were available during my care of the patient were reviewed by me and considered in my medical decision making (see chart for details).    Patient is flat footed and is wearing a show with no support. I recommend tylenol, and either shoes with arch support or buying orthotics for her shoes.    Mariah Mason was evaluated in Emergency Department  on 05/12/2019 for the symptoms described in the history of present illness. She was evaluated in the context of the global COVID-19 pandemic, which necessitated consideration that the patient might be at risk for infection with the SARS-CoV-2 virus that causes COVID-19. Institutional protocols and algorithms that pertain to the evaluation of patients at risk for COVID-19 are in a state of rapid change based on information released by regulatory bodies including the CDC and federal and state organizations. These policies and algorithms were followed during the patient's care in the ED.  Final Clinical Impression(s) / ED Diagnoses Final diagnoses:  Foot pain, bilateral    Return for intractable cough, coughing up blood,fevers >100.4 unrelieved by medication, shortness of breath, intractable vomiting, chest pain, shortness of breath, weakness,numbness, changes in speech, facial asymmetry,abdominal pain, passing out,Inability to tolerate liquids or food, cough, altered mental status or any concerns. No signs of systemic illness or infection. The patient is nontoxic-appearing on exam and vital signs are within normal limits.   I have reviewed the triage vital signs and the nursing notes. Pertinent labs &imaging results that were available during my care of  the patient were reviewed by me and considered in my medical decision making (see chart for details).  After history, exam, and medical workup I feel the patient has been appropriately medically screened and is safe for discharge home. Pertinent diagnoses were discussed with the patient. Patient was given return   [place], Kenechukwu Eckstein, MD 05/12/19 [phone]

## 2019-06-23 ENCOUNTER — Other Ambulatory Visit: Payer: Self-pay

## 2019-06-23 ENCOUNTER — Ambulatory Visit: Payer: Medicare Other | Attending: Family Medicine | Admitting: Family Medicine

## 2019-06-23 DIAGNOSIS — G8929 Other chronic pain: Secondary | ICD-10-CM

## 2019-06-23 DIAGNOSIS — Z1159 Encounter for screening for other viral diseases: Secondary | ICD-10-CM

## 2019-06-23 DIAGNOSIS — M79671 Pain in right foot: Secondary | ICD-10-CM

## 2019-06-23 DIAGNOSIS — M79672 Pain in left foot: Secondary | ICD-10-CM | POA: Diagnosis not present

## 2019-06-23 DIAGNOSIS — Z794 Long term (current) use of insulin: Secondary | ICD-10-CM

## 2019-06-23 DIAGNOSIS — I10 Essential (primary) hypertension: Secondary | ICD-10-CM | POA: Diagnosis not present

## 2019-06-23 DIAGNOSIS — E119 Type 2 diabetes mellitus without complications: Secondary | ICD-10-CM

## 2019-06-23 DIAGNOSIS — E1169 Type 2 diabetes mellitus with other specified complication: Secondary | ICD-10-CM

## 2019-06-23 MED ORDER — PREDNISONE 20 MG PO TABS: 20.0000 mg | ORAL_TABLET | Freq: Every day | ORAL | 0 refills | Status: DC

## 2019-06-23 NOTE — Progress Notes (Signed)
Patient has been called and DOB has been verified. Patient has been screened and transferred to PCP to start phone visit.   Patient is having leg pain she describes it as pulling pain.

## 2019-06-23 NOTE — Progress Notes (Signed)
Virtual Visit via Telephone Note  I connected with Mariah Mason, on 06/23/2019 at 8:55 AM by telephone due to the COVID-19 pandemic and verified that I am speaking with the correct person using two identifiers.   Consent: I discussed the limitations, risks, security and privacy concerns of performing an evaluation and management service by telephone and the availability of in person appointments. I also discussed with the patient that there may be a patient responsible charge related to this service. The patient expressed understanding and agreed to proceed.   Location of Patient: Home  Location of Provider: Clinic   Persons participating in Telemedicine visit: Thelma Zito Alicia Farrington-CMA Dr. Newlin     History of Present Illness: Mariah Mason is a 68-year-old female with a history of hypertension, type 2 diabetes mellitus (A1c 7.6) previously followed by [facility] hospitalized for hypoglycemia at [facility] from 02/17/2019 through 02/20/2019. She had presented with a CBG of 44 in the setting of poor oral intake, hyperkalemia of 6.4, creatinine of 4.0 and Metformin and glipizide were discontinued and IV fluids administered.Januvia was commenced at discharge and creatinine improved to 1.27.   She complains of pulling in her feet for which she attempted securing an appointment with a Podiatrist but was unsuccessful.  Seen at the ED for this 2 weeks ago.  Pain is worse when she wakes up in the morning and tries to walk. 2 months ago she was on Lipitor which she complained caused her feet symptoms and this was unchanged with Pravastatin which was eventually discontinued. She has been using Voltaren gel with some improvement.   Her blood sugars have been in the 135 range and she denies hypoglycemia; blood pressure was 160/74. At the ED BP was 163/96.  At her last visit with me her BP was 140/68. She rescheduled her Ophthalmology appointment for  her annual eye exam due to the bad weather. She has a lump on her neck.  She would like checked out. Past Medical History:  Diagnosis Date  . Diabetes mellitus without complication (HCC)   . Gout   . Hyperlipemia   . Hypertension    Allergies  Allergen Reactions  . Erythromycin Other (See Comments)    Current Outpatient Medications on File Prior to Visit  Medication Sig Dispense Refill  . allopurinol (ZYLOPRIM) 100 MG tablet Take 1 tablet (100 mg total) by mouth daily. 30 tablet 6  . amLODipine (NORVASC) 10 MG tablet Take 1 tablet (10 mg total) by mouth daily. 30 tablet 6  . blood glucose meter kit and supplies KIT Dispense based on patient and insurance preference. Use up to four times daily as directed. (FOR ICD-9 250.00, 250.01). 1 each 0  . carvedilol (COREG) 12.5 MG tablet Take 1 tablet (12.5 mg total) by mouth 2 (two) times daily with a meal. 60 tablet 6  . cholecalciferol (VITAMIN D3) 25 MCG (1000 UT) tablet Take 1,000 Units by mouth daily.    . colchicine 0.6 MG tablet Take 2 tablets (1.2 mg) at the onset of a gout attack, may repeat 1 tablet (0.6 mg) in 2 hours if symptoms persist 30 tablet 1  . cycloSPORINE (RESTASIS) 0.05 % ophthalmic emulsion Place 1 drop into both eyes 2 (two) times daily.     . Multiple Vitamins-Minerals (CENTRUM SILVER ADULT 50+ PO) Take 1 tablet by mouth daily.    . fluticasone (FLONASE) 50 MCG/ACT nasal spray Place 1 spray into both nostrils daily as needed for allergies.    .   pravastatin (PRAVACHOL) 40 MG tablet Take 1 tablet (40 mg total) by mouth daily. (Patient not taking: Reported on 06/23/2019) 30 tablet 3  . sitaGLIPtin (JANUVIA) 50 MG tablet Take 1 tablet (50 mg total) by mouth daily. 30 tablet 6   No current facility-administered medications on file prior to visit.    Observations/Objective: Alert, awake, oriented x3 Not in acute distress   Lab Results  Component Value Date   HGBA1C 7.6 (H) 02/18/2019     Assessment and Plan: 1.  Essential hypertension Uncontrolled We will check blood pressure at next in person visit and adjust her regimen accordingly  2. Chronic pain of both feet - Ambulatory referral to Podiatry  3. Type 2 diabetes mellitus with other specified complication, with long-term current use of insulin (HCC) Controlled A1c at next visit Continue Januvia - Hemoglobin A1c; Future - CMP14+EGFR; Future - Lipid panel; Future  4. Screening for viral disease - HCV RNA quant rflx ultra or genotyp([facility]); Future   Follow Up Instructions: Return in about 1 week (around 06/30/2019) for Evaluation of neck lump and fasting labs.    I discussed the assessment and treatment plan with the patient. The patient was provided an opportunity to ask questions and all were answered. The patient agreed with the plan and demonstrated an understanding of the instructions.   The patient was advised to call back or seek an in-person evaluation if the symptoms worsen or if the condition fails to improve as anticipated.     I provided 14 minutes total of non-face-to-face time during this encounter including median intraservice time, reviewing previous notes, investigations, ordering medications, medical decision making, coordinating care and patient verbalized understanding at the end of the visit.     Enobong Newlin, MD, FAAFP. [facility] [place], [place] [phone]   06/23/2019, 8:55 AM  

## 2019-06-30 ENCOUNTER — Ambulatory Visit: Payer: Medicare Other | Attending: Family Medicine | Admitting: Family Medicine

## 2019-06-30 ENCOUNTER — Encounter: Payer: Self-pay | Admitting: Family Medicine

## 2019-06-30 ENCOUNTER — Other Ambulatory Visit: Payer: Medicaid Other

## 2019-06-30 ENCOUNTER — Other Ambulatory Visit: Payer: Self-pay

## 2019-06-30 VITALS — BP 166/80 | HR 78 | Ht 62.0 in | Wt 200.6 lb

## 2019-06-30 DIAGNOSIS — Z794 Long term (current) use of insulin: Secondary | ICD-10-CM

## 2019-06-30 DIAGNOSIS — E1169 Type 2 diabetes mellitus with other specified complication: Secondary | ICD-10-CM | POA: Insufficient documentation

## 2019-06-30 DIAGNOSIS — Z7901 Long term (current) use of anticoagulants: Secondary | ICD-10-CM | POA: Diagnosis not present

## 2019-06-30 DIAGNOSIS — Z1159 Encounter for screening for other viral diseases: Secondary | ICD-10-CM | POA: Diagnosis not present

## 2019-06-30 DIAGNOSIS — E785 Hyperlipidemia, unspecified: Secondary | ICD-10-CM | POA: Insufficient documentation

## 2019-06-30 DIAGNOSIS — L723 Sebaceous cyst: Secondary | ICD-10-CM | POA: Diagnosis not present

## 2019-06-30 DIAGNOSIS — I10 Essential (primary) hypertension: Secondary | ICD-10-CM | POA: Diagnosis not present

## 2019-06-30 DIAGNOSIS — M109 Gout, unspecified: Secondary | ICD-10-CM | POA: Diagnosis not present

## 2019-06-30 DIAGNOSIS — Z79899 Other long term (current) drug therapy: Secondary | ICD-10-CM | POA: Insufficient documentation

## 2019-06-30 LAB — POCT GLYCOSYLATED HEMOGLOBIN (HGB A1C): HbA1c, POC (controlled diabetic range): 7.5 % — AB (ref 0.0–7.0)

## 2019-06-30 LAB — GLUCOSE, POCT (MANUAL RESULT ENTRY): POC Glucose: 184 mg/dl — AB (ref 70–99)

## 2019-06-30 MED ORDER — CARVEDILOL 12.5 MG PO TABS: 18.7500 mg | ORAL_TABLET | Freq: Two times a day (BID) | ORAL | 6 refills | Status: DC

## 2019-06-30 NOTE — Progress Notes (Signed)
Lump on left side of neck

## 2019-06-30 NOTE — Progress Notes (Signed)
Subjective:  Patient ID: Mariah Mason, female    DOB: Apr 15, 1951  Age: 69 y.o. MRN: [id number]  CC: Diabetes   HPI Mariah Mason is a 69 year old female with a history of hypertension, type 2 diabetes mellitus (A1c 7.5) previously followed by [facility] who presents today for hospital follow-up visit. She had a visit for chronic disease management last week but had complained of a lump on her neck which she wanted evaluated.  Lesion has been present for several months and on one occasion has been associated with discharge.  She denies pain or fever.   Blood pressure is elevated and she has been compliant with her antihypertensive.  Past Medical History:  Diagnosis Date  . Diabetes mellitus without complication ([place])   . Gout   . Hyperlipemia   . Hypertension     No past surgical history on file.  No family history on file.  Allergies  Allergen Reactions  . Erythromycin Other (See Comments)    Outpatient Medications Prior to Visit  Medication Sig Dispense Refill  . allopurinol (ZYLOPRIM) 100 MG tablet Take 1 tablet (100 mg total) by mouth daily. 30 tablet 6  . amLODipine (NORVASC) 10 MG tablet Take 1 tablet (10 mg total) by mouth daily. 30 tablet 6  . blood glucose meter kit and supplies KIT Dispense based on patient and insurance preference. Use up to four times daily as directed. (FOR ICD-9 250.00, 250.01). 1 each 0  . cholecalciferol (VITAMIN D3) 25 MCG (1000 UT) tablet Take 1,000 Units by mouth daily.    . colchicine 0.6 MG tablet Take 2 tablets (1.2 mg) at the onset of a gout attack, may repeat 1 tablet (0.6 mg) in 2 hours if symptoms persist 30 tablet 1  . cycloSPORINE (RESTASIS) 0.05 % ophthalmic emulsion Place 1 drop into both eyes 2 (two) times daily.     . fluticasone (FLONASE) 50 MCG/ACT nasal spray Place 1 spray into both nostrils daily as needed for allergies.    . Multiple Vitamins-Minerals (CENTRUM SILVER ADULT 50+ PO) Take 1 tablet by  mouth daily.    . pravastatin (PRAVACHOL) 40 MG tablet Take 1 tablet (40 mg total) by mouth daily. 30 tablet 3  . predniSONE (DELTASONE) 20 MG tablet Take 1 tablet (20 mg total) by mouth daily with breakfast. 5 tablet 0  . carvedilol (COREG) 12.5 MG tablet Take 1 tablet (12.5 mg total) by mouth 2 (two) times daily with a meal. 60 tablet 6  . sitaGLIPtin (JANUVIA) 50 MG tablet Take 1 tablet (50 mg total) by mouth daily. 30 tablet 6   No facility-administered medications prior to visit.     ROS Review of Systems  Constitutional: Negative for activity change, appetite change and fatigue.  HENT: Negative for congestion, sinus pressure and sore throat.   Eyes: Negative for visual disturbance.  Respiratory: Negative for cough, chest tightness, shortness of breath and wheezing.   Cardiovascular: Negative for chest pain and palpitations.  Gastrointestinal: Negative for abdominal distention, abdominal pain and constipation.  Endocrine: Negative for polydipsia.  Genitourinary: Negative for dysuria and frequency.  Musculoskeletal: Negative for arthralgias and back pain.  Skin: Positive for rash.  Neurological: Negative for tremors, light-headedness and numbness.  Hematological: Does not bruise/bleed easily.  Psychiatric/Behavioral: Negative for agitation and behavioral problems.    Objective:  BP (!) 166/80   Pulse 78   Ht 5' 2"  (1.575 m)   Wt 200 lb 9.6 oz (91 kg)   SpO2 100%  BMI 36.69 kg/m   BP/Weight 06/30/2019 05/12/2019 54/62/7035  Systolic BP 009 381 829  Diastolic BP 80 96 68  Wt. (Lbs) 200.6 207.23 207  BMI 36.69 34.49 37.86      Physical Exam Constitutional:      Appearance: She is well-developed.  Neck:     Vascular: No JVD.  Cardiovascular:     Rate and Rhythm: Normal rate.     Heart sounds: Normal heart sounds. No murmur.  Pulmonary:     Effort: Pulmonary effort is normal.     Breath sounds: Normal breath sounds. No wheezing or rales.  Chest:     Chest wall:  No tenderness.  Abdominal:     General: Bowel sounds are normal. There is no distension.     Palpations: Abdomen is soft. There is no mass.     Tenderness: There is no abdominal tenderness.  Musculoskeletal:        General: Normal range of motion.     Right lower leg: No edema.     Left lower leg: No edema.  Skin:    Comments: Left side on neck with no TTP, no discharge, soft, mobile  Neurological:     Mental Status: She is alert and oriented to person, place, and time.  Psychiatric:        Mood and Affect: Mood normal.     CMP Latest Ref Rng & Units 03/17/2019 02/20/2019 02/19/2019  Glucose 65 - 99 mg/dL 227(H) 117(H) 142(H)  BUN 8 - 27 mg/dL 20 29(H) 47(H)  Creatinine 0.57 - 1.00 mg/dL 1.27(H) 1.47(H) 2.37(H)  Sodium 134 - 144 mmol/L 139 142 139  Potassium 3.5 - 5.2 mmol/L 4.7 5.2(H) 6.0(H)  Chloride 96 - 106 mmol/L 101 122(H) 121(H)  CO2 20 - 29 mmol/L 22 15(L) 14(L)  Calcium 8.7 - 10.3 mg/dL 9.7 8.9 8.6(L)  Total Protein 6.0 - 8.5 g/dL 7.4 6.2(L) 6.3(L)  Total Bilirubin 0.0 - 1.2 mg/dL 0.3 0.3 0.6  Alkaline Phos 39 - 117 IU/L 110 50 53  AST 0 - 40 IU/L 14 13(L) 12(L)  ALT 0 - 32 IU/L 14 14 13     Lipid Panel  No results found for: CHOL, TRIG, HDL, CHOLHDL, VLDL, LDLCALC, LDLDIRECT  CBC    Component Value Date/Time   WBC 4.0 02/20/2019 0433   RBC 3.68 (L) 02/20/2019 0433   HGB 10.3 (L) 02/20/2019 0433   HCT 32.5 (L) 02/20/2019 0433   PLT 233 02/20/2019 0433   MCV 88.3 02/20/2019 0433   MCH 28.0 02/20/2019 0433   MCHC 31.7 02/20/2019 0433   RDW 13.1 02/20/2019 0433   LYMPHSABS 1.3 02/17/2019 2135   MONOABS 0.4 02/17/2019 2135   EOSABS 0.1 02/17/2019 2135   BASOSABS 0.0 02/17/2019 2135    Lab Results  Component Value Date   HGBA1C 7.5 (A) 06/30/2019    Assessment & Plan:  1. Type 2 diabetes mellitus with other specified complication, with long-term current use of insulin (HCC) Stable with A1c of 7.5; goal is less than 7.5 given age and history of  hypoglycemia Continue current regimen - POCT glucose (manual entry) - POCT glycosylated hemoglobin (Hb A1C) - Microalbumin / creatinine urine ratio - CMP14+EGFR - Lipid panel  2. Essential hypertension Uncontrolled Increase carvedilol to Counseled on blood pressure goal of less than 130/80, low-sodium, DASH diet, medication compliance, 150 minutes of moderate intensity exercise per week. Discussed medication compliance, adverse effects. - carvedilol (COREG) 12.5 MG tablet; Take 1.5 tablets (18.75 mg total) by  mouth 2 (two) times daily with a meal.  Dispense: 90 tablet; Refill: 6  3. Sebaceous cyst Patient reassured She is not interested in excision at this time   Health Care Maintenance: Healthcare maintenance at next visit Meds ordered this encounter  Medications  . carvedilol (COREG) 12.5 MG tablet    Sig: Take 1.5 tablets (18.75 mg total) by mouth 2 (two) times daily with a meal.    Dispense:  90 tablet    Refill:  6    Dose change    Follow-up: Return in about 1 month (around 07/31/2019) for Medicare wellness exam.       Charlott Rakes, MD, FAAFP. [facility] [place], [place]   06/30/2019, 9:27 AM

## 2019-07-01 LAB — LIPID PANEL
Chol/HDL Ratio: 4.2 ratio (ref 0.0–4.4)
Cholesterol, Total: 180 mg/dL (ref 100–199)
HDL: 43 mg/dL (ref >39)
LDL Chol Calc (NIH): 114 mg/dL — ABNORMAL HIGH (ref 0–99)
Triglycerides: 126 mg/dL (ref 0–149)
VLDL Cholesterol Cal: 23 mg/dL (ref 5–40)

## 2019-07-01 LAB — CMP14+EGFR
ALT: 13 IU/L (ref 0–32)
AST: 12 IU/L (ref 0–40)
Albumin/Globulin Ratio: 1.4 (ref 1.2–2.2)
Albumin: 4.4 g/dL (ref 3.8–4.8)
Alkaline Phosphatase: 95 IU/L (ref 39–117)
BUN/Creatinine Ratio: 23 (ref 12–28)
BUN: 33 mg/dL — ABNORMAL HIGH (ref 8–27)
Bilirubin Total: 0.2 mg/dL (ref 0.0–1.2)
CO2: 20 mmol/L (ref 20–29)
Calcium: 10 mg/dL (ref 8.7–10.3)
Chloride: 106 mmol/L (ref 96–106)
Creatinine, Ser: 1.44 mg/dL — ABNORMAL HIGH (ref 0.57–1.00)
GFR calc Af Amer: 43 mL/min/{1.73_m2} — ABNORMAL LOW (ref >59)
GFR calc non Af Amer: 37 mL/min/{1.73_m2} — ABNORMAL LOW (ref >59)
Globulin, Total: 3.2 g/dL (ref 1.5–4.5)
Glucose: 195 mg/dL — ABNORMAL HIGH (ref 65–99)
Potassium: 4.4 mmol/L (ref 3.5–5.2)
Sodium: 141 mmol/L (ref 134–144)
Total Protein: 7.6 g/dL (ref 6.0–8.5)

## 2019-07-01 LAB — HCV RNA QUANT RFLX ULTRA OR GENOTYP: HCV Quant Baseline: NOT DETECTED IU/mL

## 2019-07-01 LAB — MICROALBUMIN / CREATININE URINE RATIO
Creatinine, Urine: 49.8 mg/dL
Microalb/Creat Ratio: 490 mg/g creat — ABNORMAL HIGH (ref 0–29)
Microalbumin, Urine: 244.1 ug/mL

## 2019-07-02 ENCOUNTER — Encounter: Payer: Self-pay | Admitting: Podiatry

## 2019-07-02 ENCOUNTER — Ambulatory Visit (INDEPENDENT_AMBULATORY_CARE_PROVIDER_SITE_OTHER): Payer: Medicare Other

## 2019-07-02 ENCOUNTER — Other Ambulatory Visit: Payer: Self-pay

## 2019-07-02 ENCOUNTER — Ambulatory Visit (INDEPENDENT_AMBULATORY_CARE_PROVIDER_SITE_OTHER): Payer: Medicare Other | Admitting: Podiatry

## 2019-07-02 ENCOUNTER — Other Ambulatory Visit: Payer: Self-pay | Admitting: Podiatry

## 2019-07-02 VITALS — BP 151/77 | HR 89 | Temp 98.2°F | Resp 16

## 2019-07-02 DIAGNOSIS — G629 Polyneuropathy, unspecified: Secondary | ICD-10-CM

## 2019-07-02 DIAGNOSIS — M79676 Pain in unspecified toe(s): Secondary | ICD-10-CM

## 2019-07-02 DIAGNOSIS — M79672 Pain in left foot: Secondary | ICD-10-CM

## 2019-07-02 DIAGNOSIS — B351 Tinea unguium: Secondary | ICD-10-CM | POA: Diagnosis not present

## 2019-07-02 DIAGNOSIS — M79671 Pain in right foot: Secondary | ICD-10-CM

## 2019-07-02 DIAGNOSIS — M79609 Pain in unspecified limb: Secondary | ICD-10-CM

## 2019-07-02 MED ORDER — GABAPENTIN 300 MG PO CAPS: 300.0000 mg | ORAL_CAPSULE | Freq: Three times a day (TID) | ORAL | 3 refills | Status: DC

## 2019-07-02 NOTE — Patient Instructions (Signed)
Diabetes Mellitus and Foot Care Foot care is an important part of your health, especially when you have diabetes. Diabetes may cause you to have problems because of poor blood flow (circulation) to your feet and legs, which can cause your skin to:  Become thinner and drier.  Break more easily.  Heal more slowly.  Peel and crack. You may also have nerve damage (neuropathy) in your legs and feet, causing decreased feeling in them. This means that you may not notice minor injuries to your feet that could lead to more serious problems. Noticing and addressing any potential problems early is the best way to prevent future foot problems. How to care for your feet Foot hygiene  Wash your feet daily with warm water and mild soap. Do not use hot water. Then, pat your feet and the areas between your toes until they are completely dry. Do not soak your feet as this can dry your skin.  Trim your toenails straight across. Do not dig under them or around the cuticle. File the edges of your nails with an emery board or nail file.  Apply a moisturizing lotion or petroleum jelly to the skin on your feet and to dry, brittle toenails. Use lotion that does not contain alcohol and is unscented. Do not apply lotion between your toes. Shoes and socks  Wear clean socks or stockings every day. Make sure they are not too tight. Do not wear knee-high stockings since they may decrease blood flow to your legs.  Wear shoes that fit properly and have enough cushioning. Always look in your shoes before you put them on to be sure there are no objects inside.  To break in new shoes, wear them for just a few hours a day. This prevents injuries on your feet. Wounds, scrapes, corns, and calluses  Check your feet daily for blisters, cuts, bruises, sores, and redness. If you cannot see the bottom of your feet, use a mirror or ask someone for help.  Do not cut corns or calluses or try to remove them with medicine.  If you  find a minor scrape, cut, or break in the skin on your feet, keep it and the skin around it clean and dry. You may clean these areas with mild soap and water. Do not clean the area with peroxide, alcohol, or iodine.  If you have a wound, scrape, corn, or callus on your foot, look at it several times a day to make sure it is healing and not infected. Check for: ? Redness, swelling, or pain. ? Fluid or blood. ? Warmth. ? Pus or a bad smell. General instructions  Do not cross your legs. This may decrease blood flow to your feet.  Do not use heating pads or hot water bottles on your feet. They may burn your skin. If you have lost feeling in your feet or legs, you may not know this is happening until it is too late.  Protect your feet from hot and cold by wearing shoes, such as at the beach or on hot pavement.  Schedule a complete foot exam at least once a year (annually) or more often if you have foot problems. If you have foot problems, report any cuts, sores, or bruises to your health care provider immediately. Contact a health care provider if:  You have a medical condition that increases your risk of infection and you have any cuts, sores, or bruises on your feet.  You have an injury that is not   healing.  You have redness on your legs or feet.  You feel burning or tingling in your legs or feet.  You have pain or cramps in your legs and feet.  Your legs or feet are numb.  Your feet always feel cold.  You have pain around a toenail. Get help right away if:  You have a wound, scrape, corn, or callus on your foot and: ? You have pain, swelling, or redness that gets worse. ? You have fluid or blood coming from the wound, scrape, corn, or callus. ? Your wound, scrape, corn, or callus feels warm to the touch. ? You have pus or a bad smell coming from the wound, scrape, corn, or callus. ? You have a fever. ? You have a red line going up your leg. Summary  Check your feet every day  for cuts, sores, red spots, swelling, and blisters.  Moisturize feet and legs daily.  Wear shoes that fit properly and have enough cushioning.  If you have foot problems, report any cuts, sores, or bruises to your health care provider immediately.  Schedule a complete foot exam at least once a year (annually) or more often if you have foot problems. This information is not intended to replace advice given to you by your health care provider. Make sure you discuss any questions you have with your health care provider. Document Revised: 01/07/2019 Document Reviewed: 05/18/2016 Elsevier Patient Education  2020 Elsevier Inc.  

## 2019-07-02 NOTE — Progress Notes (Signed)
   Subjective:    Patient ID: Mariah Mason, female    DOB: 09/04/1950, 69 y.o.   MRN: [id number]  HPI    Review of Systems  All other systems reviewed and are negative.      Objective:   Physical Exam        Assessment & Plan:

## 2019-07-02 NOTE — Progress Notes (Signed)
Subjective:   Patient ID: Mariah Mason, female   DOB: 69 y.o.   MRN: [id number]   HPI Patient presents stating that she gets burning and tingling in both of her feet she has moderate obesity which is complicating factor and she has nails that are thick and dystrophic and she cannot cut.  She presents with caregiver and attended number of years of these problems and a number of years of having diabetes and is moderately obese.  Patient does not smoke    Review of Systems  All other systems reviewed and are negative.       Objective:  Physical Exam Vitals and nursing note reviewed.  Constitutional:      Appearance: She is well-developed.  Pulmonary:     Effort: Pulmonary effort is normal.  Musculoskeletal:        General: Normal range of motion.  Skin:    General: Skin is warm.  Neurological:     Mental Status: She is alert.     Neurovascular status was found to be intact muscle strength was adequate.  Range of motion was moderately diminished both subtalar midtarsal joint bilateral as was muscle strength diminished of the extensors flexors.  Patient did have diminishment of sharp dull vibratory I was found to have thick yellow brittle nailbeds 1-5 both feet that were painful when pressed dorsally and patient does have good digital perfusion well oriented x3     Assessment:  Patient with low to mid grade neuropathy noted bilateral with mycotic nail infection with pain 1-5 both feet     Plan:  H&P condition reviewed and today debridement of all nails accomplished with no iatrogenic bleeding.  I discussed routine care with him started on gabapentin 300 mg at night with possible increase morning and afternoon as needed.  Patient will be seen back to reevaluate  X-rays indicate quite a bit of spurring of the heel bilateral with moderate collapse midtarsal joint bilateral with no indications currently of Charcot's type structure

## 2019-07-03 ENCOUNTER — Telehealth: Payer: Self-pay

## 2019-07-03 NOTE — Telephone Encounter (Signed)
Patient name and DOB has been verified Patient was informed of lab results. Patient had no questions.  

## 2019-07-03 NOTE — Telephone Encounter (Signed)
-----   Message from Hoy Register, MD sent at 07/02/2019 12:50 PM EST ----- Cholesterol is slightly elevated.  Please advised to comply with the cholesterol medication and low-cholesterol diet.  Kidney function is slightly abnormal but stable.  No regimen changes needed at this time

## 2019-07-22 ENCOUNTER — Other Ambulatory Visit: Payer: Self-pay | Admitting: Pharmacist

## 2019-07-22 MED ORDER — PRAVASTATIN SODIUM 40 MG PO TABS: 40.0000 mg | ORAL_TABLET | Freq: Every day | ORAL | 0 refills | Status: DC

## 2019-08-12 ENCOUNTER — Ambulatory Visit: Payer: Medicare Other | Attending: Family Medicine | Admitting: Family Medicine

## 2019-08-12 ENCOUNTER — Encounter: Payer: Self-pay | Admitting: Family Medicine

## 2019-08-12 ENCOUNTER — Other Ambulatory Visit: Payer: Self-pay

## 2019-08-12 VITALS — BP 156/73 | HR 77 | Ht 62.0 in | Wt 204.0 lb

## 2019-08-12 DIAGNOSIS — E1169 Type 2 diabetes mellitus with other specified complication: Secondary | ICD-10-CM

## 2019-08-12 DIAGNOSIS — Z7901 Long term (current) use of anticoagulants: Secondary | ICD-10-CM | POA: Diagnosis not present

## 2019-08-12 DIAGNOSIS — I10 Essential (primary) hypertension: Secondary | ICD-10-CM | POA: Insufficient documentation

## 2019-08-12 DIAGNOSIS — Z0000 Encounter for general adult medical examination without abnormal findings: Secondary | ICD-10-CM | POA: Diagnosis not present

## 2019-08-12 DIAGNOSIS — M109 Gout, unspecified: Secondary | ICD-10-CM | POA: Insufficient documentation

## 2019-08-12 DIAGNOSIS — E2839 Other primary ovarian failure: Secondary | ICD-10-CM | POA: Diagnosis not present

## 2019-08-12 DIAGNOSIS — Z1211 Encounter for screening for malignant neoplasm of colon: Secondary | ICD-10-CM | POA: Diagnosis not present

## 2019-08-12 DIAGNOSIS — Z794 Long term (current) use of insulin: Secondary | ICD-10-CM | POA: Diagnosis not present

## 2019-08-12 DIAGNOSIS — Z1331 Encounter for screening for depression: Secondary | ICD-10-CM | POA: Diagnosis not present

## 2019-08-12 DIAGNOSIS — Z23 Encounter for immunization: Secondary | ICD-10-CM

## 2019-08-12 DIAGNOSIS — Z79899 Other long term (current) drug therapy: Secondary | ICD-10-CM | POA: Insufficient documentation

## 2019-08-12 DIAGNOSIS — E785 Hyperlipidemia, unspecified: Secondary | ICD-10-CM | POA: Insufficient documentation

## 2019-08-12 LAB — GLUCOSE, POCT (MANUAL RESULT ENTRY): POC Glucose: 189 mg/dl — AB (ref 70–99)

## 2019-08-12 MED ORDER — MISC. DEVICES MISC: 0 refills | Status: AC

## 2019-08-12 NOTE — Patient Instructions (Signed)
  Mariah Mason , Thank you for taking time to come for your Medicare Wellness Visit. I appreciate your ongoing commitment to your health goals. Please review the following plan we discussed and let me know if I can assist you in the future.   These are the goals we discussed: Goals   None     This is a list of the screening recommended for you and due dates:  Health Maintenance  Topic Date Due  .  Hepatitis C: One time screening is recommended by Center for Disease Control  (CDC) for  adults born from [birth year] through 1966.   Never done  . Complete foot exam   Never done  . Eye exam for diabetics  Never done  . Tetanus Vaccine  Never done  . Mammogram  Never done  . Colon Cancer Screening  Never done  . DEXA scan (bone density measurement)  Never done  . Pneumonia vaccines (1 of 2 - PCV13) Never done  . Flu Shot  11/29/2019  . Hemoglobin A1C  12/31/2019  . Urine Protein Check  06/29/2020

## 2019-08-12 NOTE — Progress Notes (Signed)
Subjective:   Constantina Laseter is a 69 y.o. female who presents for Medicare Annual (Subsequent) preventive examination. She is requesting prescription for shower chair.    Review of Systems:  General: negative for fever, weight loss, appetite change Eyes: no visual symptoms. ENT: no ear symptoms, no sinus tenderness, no nasal congestion or sore throat. Neck: no pain  Respiratory: no wheezing, shortness of breath, cough Cardiovascular: no chest pain, no dyspnea on exertion, no pedal edema, no orthopnea. Gastrointestinal: no abdominal pain, no diarrhea, no constipation Genito-Urinary: no urinary frequency, no dysuria, no polyuria. Hematologic: no bruising Endocrine: no cold or heat intolerance Neurological: no headaches, no seizures, no tremors Musculoskeletal: no joint pains, no joint swelling Skin: no pruritus, no rash. Psychological: no depression, no anxiety,          Objective:     Vitals: BP (!) 156/73   Pulse 77   Ht 5' 2"  (1.575 m)   Wt 204 lb (92.5 kg)   SpO2 99%   BMI 37.31 kg/m   Body mass index is 37.31 kg/m.  Advanced Directives 08/12/2019 06/30/2019 06/23/2019 05/12/2019 02/18/2019 02/17/2019 01/03/2019  Does Patient Have a Medical Advance Directive? No No No No - No No  Would patient like information on creating a medical advance directive? - No - Guardian declined No - Guardian declined No - Patient declined No - Patient declined - -    Tobacco Social History   Tobacco Use  Smoking Status Never Smoker  Smokeless Tobacco Never Used     Counseling given: Not Answered   Clinical Intake:  Pre-visit preparation completed: Yes  Pain : No/denies pain     Diabetes: Yes CBG done?: Yes CBG resulted in Enter/ Edit results?: Yes Did pt. bring in CBG monitor from home?: No     Interpreter Needed?: No     Past Medical History:  Diagnosis Date  . Diabetes mellitus without complication ([place])   . Gout   . Hyperlipemia   . Hypertension    No past  surgical history on file. No family history on file. Social History   Socioeconomic History  . Marital status: Single    Spouse name: Not on file  . Number of children: Not on file  . Years of education: Not on file  . Highest education level: Not on file  Occupational History  . Not on file  Tobacco Use  . Smoking status: Never Smoker  . Smokeless tobacco: Never Used  Substance and Sexual Activity  . Alcohol use: Never  . Drug use: Never  . Sexual activity: Not on file  Other Topics Concern  . Not on file  Social History Narrative  . Not on file   Social Determinants of Health   Financial Resource Strain:   . Difficulty of Paying Living Expenses:   Food Insecurity:   . Worried About Charity fundraiser in the Last Year:   . Arboriculturist in the Last Year:   Transportation Needs:   . Film/video editor (Medical):   Marland Kitchen Lack of Transportation (Non-Medical):   Physical Activity:   . Days of Exercise per Week:   . Minutes of Exercise per Session:   Stress:   . Feeling of Stress :   Social Connections:   . Frequency of Communication with Friends and Family:   . Frequency of Social Gatherings with Friends and Family:   . Attends Religious Services:   . Active Member of Clubs or Organizations:   .  Attends Archivist Meetings:   Marland Kitchen Marital Status:     Outpatient Encounter Medications as of 08/12/2019  Medication Sig  . allopurinol (ZYLOPRIM) 100 MG tablet Take 1 tablet (100 mg total) by mouth daily.  Marland Kitchen amLODipine (NORVASC) 10 MG tablet Take 1 tablet (10 mg total) by mouth daily.  . blood glucose meter kit and supplies KIT Dispense based on patient and insurance preference. Use up to four times daily as directed. (FOR ICD-9 250.00, 250.01).  . carvedilol (COREG) 12.5 MG tablet Take 1.5 tablets (18.75 mg total) by mouth 2 (two) times daily with a meal.  . cholecalciferol (VITAMIN D3) 25 MCG (1000 UT) tablet Take 1,000 Units by mouth daily.  . colchicine 0.6  MG tablet Take 2 tablets (1.2 mg) at the onset of a gout attack, may repeat 1 tablet (0.6 mg) in 2 hours if symptoms persist  . cycloSPORINE (RESTASIS) 0.05 % ophthalmic emulsion Place 1 drop into both eyes 2 (two) times daily.   . fluticasone (FLONASE) 50 MCG/ACT nasal spray Place 1 spray into both nostrils daily as needed for allergies.  Marland Kitchen gabapentin (NEURONTIN) 300 MG capsule Take 1 capsule (300 mg total) by mouth 3 (three) times daily.  . Multiple Vitamins-Minerals (CENTRUM SILVER ADULT 50+ PO) Take 1 tablet by mouth daily.  . pravastatin (PRAVACHOL) 40 MG tablet Take 1 tablet (40 mg total) by mouth daily.  . traMADol (ULTRAM) 50 MG tablet Take 50 mg by mouth at bedtime as needed.  . sitaGLIPtin (JANUVIA) 50 MG tablet Take 1 tablet (50 mg total) by mouth daily.   No facility-administered encounter medications on file as of 08/12/2019.    Activities of Daily Living In your present state of health, do you have any difficulty performing the following activities: 08/12/2019 02/18/2019  Hearing? N N  Vision? N N  Difficulty concentrating or making decisions? N N  Walking or climbing stairs? N Y  Dressing or bathing? N N  Doing errands, shopping? N Y  Some recent data might be hidden   Constitutional: normal appearing,  Eyes: PERRLA HEENT: Head is atraumatic, normal sinuses, normal oropharynx, normal appearing tonsils and palate, tympanic membrane is normal bilaterally. Neck: normal range of motion, no thyromegaly, no JVD Breast: Skin tag on left breast.  No palpable lumps or masses, no tenderness, no lymphadenopathy Cardiovascular: normal rate and rhythm, normal heart sounds, no murmurs, rub or gallop, no pedal edema Respiratory: Normal breath sounds, clear to auscultation bilaterally, no wheezes, no rales, no rhonchi Abdomen: soft, not tender to palpation, normal bowel sounds, no enlarged organs Musculoskeletal: Full ROM, no tenderness in joints Skin: warm and dry, no  lesions. Neurological: alert, oriented x3, cranial nerves I-XII grossly intact , normal motor strength, normal sensation. Psychological: normal mood.    Patient Care Team: Charlott Rakes, MD as PCP - General (Family Medicine)    Assessment:   This is a routine wellness examination for [place].  Exercise Activities and Dietary recommendations    Goals   None     Fall Risk Fall Risk  08/12/2019 06/30/2019 06/23/2019  Falls in the past year? 0 0 0   Is the patient's home free of loose throw rugs in walkways, pet beds, electrical cords, etc?   yes      Grab bars in the bathroom? yes      Handrails on the stairs?   yes      Adequate lighting?   yes  Timed Get Up and Go performed: yes  Depression Screen PHQ 2/9 Scores 08/12/2019  PHQ - 2 Score 0     Cognitive Function Normal         There is no immunization history on file for this patient.  Qualifies for Shingles Vaccine?yes  Screening Tests Health Maintenance  Topic Date Due  . Hepatitis C Screening  Never done  . FOOT EXAM  Never done  . OPHTHALMOLOGY EXAM  Never done  . TETANUS/TDAP  Never done  . MAMMOGRAM  Never done  . COLONOSCOPY  Never done  . DEXA SCAN  Never done  . PNA vac Low Risk Adult (1 of 2 - PCV13) Never done  . INFLUENZA VACCINE  11/29/2019  . HEMOGLOBIN A1C  12/31/2019  . URINE MICROALBUMIN  06/29/2020    Cancer Screenings: Lung: Low Dose CT Chest recommended if Age 36-80 years, 30 pack-year currently smoking OR have quit w/in 15years. Patient does not qualify. Breast:  Up to date on Mammogram? No  - she would like to schedule at her usual place in [facility] Up to date of Bone Density/Dexa? No Colorectal: no - would rather cologuard than colonoscopy  Additional Screenings: due: Hepatitis C Screening:      Plan:    1. Encounter for Medicare annual wellness exam Counseled on 150 minutes of exercise per week, healthy eating (including decreased daily intake of saturated fats,  cholesterol, added sugars, sodium), routine healthcare maintenance. - Ambulatory referral to Ophthalmology  2. Type 2 diabetes mellitus with other specified complication, with long-term current use of insulin (HCC) Controlled - POCT glucose (manual entry) - Ambulatory referral to Ophthalmology  3. Screening for colon cancer - Cologuard  4. Estrogen deficiency - DG Bone Density; Future    I have personally reviewed and noted the following in the patient's chart:   . Medical and social history . Use of alcohol, tobacco or illicit drugs  . Current medications and supplements . Functional ability and status . Nutritional status . Physical activity . Advanced directives . List of other physicians . Hospitalizations, surgeries, and ER visits in previous 12 months . Vitals . Screenings to include cognitive, depression, and falls . Referrals and appointments  In addition, I have reviewed and discussed with patient certain preventive protocols, quality metrics, and best practice recommendations. A written personalized care plan for preventive services as well as general preventive health recommendations were provided to patient.     Charlott Rakes, MD  08/12/2019

## 2019-08-24 LAB — COLOGUARD: Cologuard: NEGATIVE

## 2019-08-27 LAB — COLOGUARD: COLOGUARD: NEGATIVE

## 2019-09-18 ENCOUNTER — Other Ambulatory Visit: Payer: Self-pay | Admitting: Family Medicine

## 2019-09-18 DIAGNOSIS — E2839 Other primary ovarian failure: Secondary | ICD-10-CM

## 2019-09-20 ENCOUNTER — Encounter (HOSPITAL_BASED_OUTPATIENT_CLINIC_OR_DEPARTMENT_OTHER): Payer: Self-pay | Admitting: *Deleted

## 2019-09-20 ENCOUNTER — Other Ambulatory Visit: Payer: Self-pay

## 2019-09-20 ENCOUNTER — Emergency Department (HOSPITAL_BASED_OUTPATIENT_CLINIC_OR_DEPARTMENT_OTHER)
Admission: EM | Admit: 2019-09-20 | Discharge: 2019-09-20 | Disposition: A | Discharge status: Home / Self Care | Payer: Medicare Other | Attending: Emergency Medicine | Admitting: Emergency Medicine

## 2019-09-20 DIAGNOSIS — I10 Essential (primary) hypertension: Secondary | ICD-10-CM | POA: Insufficient documentation

## 2019-09-20 DIAGNOSIS — R5383 Other fatigue: Secondary | ICD-10-CM | POA: Diagnosis present

## 2019-09-20 DIAGNOSIS — R531 Weakness: Secondary | ICD-10-CM | POA: Diagnosis not present

## 2019-09-20 DIAGNOSIS — Z79899 Other long term (current) drug therapy: Secondary | ICD-10-CM | POA: Insufficient documentation

## 2019-09-20 DIAGNOSIS — E1140 Type 2 diabetes mellitus with diabetic neuropathy, unspecified: Secondary | ICD-10-CM | POA: Insufficient documentation

## 2019-09-20 HISTORY — DX: Polyneuropathy, unspecified: G62.9

## 2019-09-20 LAB — CBG MONITORING, ED: Glucose-Capillary: 164 mg/dL — ABNORMAL HIGH (ref 70–99)

## 2019-09-20 LAB — URINALYSIS, ROUTINE W REFLEX MICROSCOPIC
Bilirubin Urine: NEGATIVE
Glucose, UA: NEGATIVE mg/dL
Ketones, ur: NEGATIVE mg/dL
Nitrite: NEGATIVE
Protein, ur: NEGATIVE mg/dL
Specific Gravity, Urine: 1.01 (ref 1.005–1.030)
pH: 6 (ref 5.0–8.0)

## 2019-09-20 LAB — CBC WITH DIFFERENTIAL/PLATELET
Abs Immature Granulocytes: 0.04 10*3/uL (ref 0.00–0.07)
Basophils Absolute: 0 10*3/uL (ref 0.0–0.1)
Basophils Relative: 0 %
Eosinophils Absolute: 0.2 10*3/uL (ref 0.0–0.5)
Eosinophils Relative: 2 %
HCT: 36.1 % (ref 36.0–46.0)
Hemoglobin: 11.9 g/dL — ABNORMAL LOW (ref 12.0–15.0)
Immature Granulocytes: 1 %
Lymphocytes Relative: 22 %
Lymphs Abs: 1.7 10*3/uL (ref 0.7–4.0)
MCH: 27.5 pg (ref 26.0–34.0)
MCHC: 33 g/dL (ref 30.0–36.0)
MCV: 83.4 fL (ref 80.0–100.0)
Monocytes Absolute: 0.5 10*3/uL (ref 0.1–1.0)
Monocytes Relative: 6 %
Neutro Abs: 5.1 10*3/uL (ref 1.7–7.7)
Neutrophils Relative %: 69 %
Platelets: 232 10*3/uL (ref 150–400)
RBC: 4.33 MIL/uL (ref 3.87–5.11)
RDW: 12.8 % (ref 11.5–15.5)
WBC: 7.5 10*3/uL (ref 4.0–10.5)
nRBC: 0 % (ref 0.0–0.2)

## 2019-09-20 LAB — BASIC METABOLIC PANEL
Anion gap: 11 (ref 5–15)
BUN: 35 mg/dL — ABNORMAL HIGH (ref 8–23)
CO2: 23 mmol/L (ref 22–32)
Calcium: 9.2 mg/dL (ref 8.9–10.3)
Chloride: 105 mmol/L (ref 98–111)
Creatinine, Ser: 1.62 mg/dL — ABNORMAL HIGH (ref 0.44–1.00)
GFR calc Af Amer: 37 mL/min — ABNORMAL LOW (ref >60)
GFR calc non Af Amer: 32 mL/min — ABNORMAL LOW (ref >60)
Glucose, Bld: 175 mg/dL — ABNORMAL HIGH (ref 70–99)
Potassium: 4.6 mmol/L (ref 3.5–5.1)
Sodium: 139 mmol/L (ref 135–145)

## 2019-09-20 LAB — URINALYSIS, MICROSCOPIC (REFLEX)

## 2019-09-20 MED ORDER — SODIUM CHLORIDE 0.9 % IV BOLUS
500.0000 mL | Freq: Once | INTRAVENOUS | Status: AC
  Administered 2019-09-20: 500 mL via INTRAVENOUS

## 2019-09-20 NOTE — ED Provider Notes (Signed)
[facility] EMERGENCY DEPARTMENT Provider Note   CSN: [id number] Arrival date & time: 09/20/19  1812     History Chief Complaint  Patient presents with  . Hypertension    Mariah Mason is a 69 y.o. female.  Patient with general fatigue, and feeling unwell.  Neuropathy in her feet are bothering her.  Does not like how gabapentin makes her feel.  No specific chest pain, shortness of breath, abdominal pain, urinary symptoms.  Blood pressure and blood sugar have mildly been elevated at home as well.  The history is provided by the patient.  Weakness Severity:  Mild Onset quality:  Gradual Chronicity:  New Relieved by:  Nothing Worsened by:  Nothing Ineffective treatments:  None tried Associated symptoms: no abdominal pain, no arthralgias, no chest pain, no cough, no dizziness, no dysuria, no fever, no headaches, no seizures, no shortness of breath and no vomiting   Risk factors: diabetes        Past Medical History:  Diagnosis Date  . Diabetes mellitus without complication ([place])   . Gout   . Hyperlipemia   . Hypertension   . Neuropathy     Patient Active Problem List   Diagnosis Date Noted  . Gout   . Hypertension   . Type 2 diabetes mellitus ([place]) 02/18/2019  . AKI (acute kidney injury) ([place]) 02/18/2019  . Hyperkalemia 02/18/2019  . Metabolic acidosis 13/24/4010  . Hypoglycemia 02/17/2019  . Long term current use of oral hypoglycemic drug 05/23/2017  . Presbyopia of both eyes 05/23/2017  . Vitreous floaters of both eyes 05/23/2017  . Keratoconjunctivitis sicca of both eyes not specified as Sjogren's 10/16/2016  . Nuclear sclerotic cataract of both eyes 10/16/2016    History reviewed. No pertinent surgical history.   OB History   No obstetric history on file.     No family history on file.  Social History   Tobacco Use  . Smoking status: Never Smoker  . Smokeless tobacco: Never Used  Substance Use Topics  . Alcohol use: Never  . Drug use:  Never    Home Medications Prior to Admission medications   Medication Sig Start Date End Date Taking? Authorizing Provider  allopurinol (ZYLOPRIM) 100 MG tablet Take 1 tablet (100 mg total) by mouth daily. 03/17/19   Charlott Rakes, MD  amLODipine (NORVASC) 10 MG tablet Take 1 tablet (10 mg total) by mouth daily. 03/17/19   Charlott Rakes, MD  blood glucose meter kit and supplies KIT Dispense based on patient and insurance preference. Use up to four times daily as directed. (FOR ICD-9 250.00, 250.01). 02/20/19   Donne Hazel, MD  carvedilol (COREG) 12.5 MG tablet Take 1.5 tablets (18.75 mg total) by mouth 2 (two) times daily with a meal. 06/30/19   Charlott Rakes, MD  cholecalciferol (VITAMIN D3) 25 MCG (1000 UT) tablet Take 1,000 Units by mouth daily.    [provider]  colchicine 0.6 MG tablet Take 2 tablets (1.2 mg) at the onset of a gout attack, may repeat 1 tablet (0.6 mg) in 2 hours if symptoms persist 03/17/19   Charlott Rakes, MD  cycloSPORINE (RESTASIS) 0.05 % ophthalmic emulsion Place 1 drop into both eyes 2 (two) times daily.  12/05/18   [provider]  fluticasone (FLONASE) 50 MCG/ACT nasal spray Place 1 spray into both nostrils daily as needed for allergies. 02/27/16   [provider]  gabapentin (NEURONTIN) 300 MG capsule Take 1 capsule (300 mg total) by mouth 3 (three) times  daily. 07/02/19   Wallene Huh, DPM  Misc. Devices [place] Shower chair. Dx- Diabetes mellitus 08/12/19   Charlott Rakes, MD  Multiple Vitamins-Minerals (CENTRUM SILVER ADULT 50+ PO) Take 1 tablet by mouth daily.    [provider]  pravastatin (PRAVACHOL) 40 MG tablet Take 1 tablet (40 mg total) by mouth daily. 07/22/19   Charlott Rakes, MD  sitaGLIPtin (JANUVIA) 50 MG tablet Take 1 tablet (50 mg total) by mouth daily. 03/17/19 04/16/19  Charlott Rakes, MD  traMADol (ULTRAM) 50 MG tablet Take 50 mg by mouth at bedtime as needed. 06/02/19   [provider]     Allergies    Erythromycin  Review of Systems   Review of Systems  Constitutional: Positive for fatigue. Negative for chills and fever.  HENT: Negative for ear pain and sore throat.   Eyes: Negative for pain and visual disturbance.  Respiratory: Negative for cough and shortness of breath.   Cardiovascular: Negative for chest pain and palpitations.  Gastrointestinal: Negative for abdominal pain and vomiting.  Genitourinary: Negative for dysuria and hematuria.  Musculoskeletal: Negative for arthralgias and back pain.  Skin: Negative for color change and rash.  Neurological: Positive for weakness and numbness. Negative for dizziness, tremors, seizures, syncope, facial asymmetry, speech difficulty and headaches.  All other systems reviewed and are negative.   Physical Exam Updated Vital Signs BP (!) 159/88   Pulse 85   Temp 99.2 F (37.3 C) (Oral)   Resp 17   Ht 5' 4"  (1.626 m)   Wt 93 kg   SpO2 100%   BMI 35.19 kg/m   Physical Exam Vitals and nursing note reviewed.  Constitutional:      General: She is not in acute distress.    Appearance: She is well-developed.  HENT:     Head: Normocephalic and atraumatic.     Nose: Nose normal.     Mouth/Throat:     Mouth: Mucous membranes are moist.  Eyes:     Extraocular Movements: Extraocular movements intact.     Conjunctiva/sclera: Conjunctivae normal.     Pupils: Pupils are equal, round, and reactive to light.  Cardiovascular:     Rate and Rhythm: Normal rate and regular rhythm.     Pulses: Normal pulses.     Heart sounds: Normal heart sounds. No murmur.  Pulmonary:     Effort: Pulmonary effort is normal. No respiratory distress.     Breath sounds: Normal breath sounds.  Abdominal:     General: Abdomen is flat.     Palpations: Abdomen is soft.     Tenderness: There is no abdominal tenderness.  Musculoskeletal:        General: Normal range of motion.     Cervical back: Normal range of motion and neck supple.   Skin:    General: Skin is warm and dry.  Neurological:     General: No focal deficit present.     Mental Status: She is alert and oriented to person, place, and time.     Cranial Nerves: No cranial nerve deficit.     Coordination: Coordination normal.  Psychiatric:        Mood and Affect: Mood normal.     ED Results / Procedures / Treatments   Labs (all labs ordered are listed, but only abnormal results are displayed) Labs Reviewed  CBC WITH DIFFERENTIAL/PLATELET - Abnormal; Notable for the following components:      Result Value   Hemoglobin 11.9 (*)    All  other components within normal limits  BASIC METABOLIC PANEL - Abnormal; Notable for the following components:   Glucose, Bld 175 (*)    BUN 35 (*)    Creatinine, Ser 1.62 (*)    GFR calc non Af Amer 32 (*)    GFR calc Af Amer 37 (*)    All other components within normal limits  URINALYSIS, ROUTINE W REFLEX MICROSCOPIC - Abnormal; Notable for the following components:   Hgb urine dipstick TRACE (*)    Leukocytes,Ua TRACE (*)    All other components within normal limits  URINALYSIS, MICROSCOPIC (REFLEX) - Abnormal; Notable for the following components:   Bacteria, UA RARE (*)    All other components within normal limits  CBG MONITORING, ED - Abnormal; Notable for the following components:   Glucose-Capillary 164 (*)    All other components within normal limits    EKG EKG Interpretation  Date/Time:  Sunday Sep 20 2019 19:51:04 EDT Ventricular Rate:  85 PR Interval:    QRS Duration: 77 QT Interval:  376 QTC Calculation: 448 R Axis:   23 Text Interpretation: Sinus rhythm Nonspecific T abnormalities, lateral leads Baseline wander in lead(s) V1 Confirmed by Lennice Sites [phone]) on 09/20/2019 8:25:30 PM   Radiology No results found.  Procedures Procedures (including critical care time)  Medications Ordered in ED Medications  sodium chloride 0.9 % bolus 500 mL (500 mLs Intravenous New Bag/Given 09/20/19 1940)     ED Course  I have reviewed the triage vital signs and the nursing notes.  Pertinent labs & imaging results that were available during my care of the patient were reviewed by me and considered in my medical decision making (see chart for details).    MDM Rules/Calculators/A&P                      Mariah Mason is a 69 year old female history of hypertension, diabetes who presents the ED with generalized weakness.  Normal vitals.  No fever.  Blood sugar unremarkable.  Patient just not feeling well over the last several days.  Neuropathy in her feet are making her feel bad.  Otherwise no chest pain, no shortness of breath.  EKG shows sinus rhythm.  No ischemic changes.  Doubt cardiac process.  Doubt ACS.  No shortness of breath.  Doubt pneumonia, PE.  Clear breath sounds.  No significant anemia, electrolyte abnormality, kidney injury.  No evidence of DKA.  No UTI.  Overall felt better after IV fluids.  Recommend follow-up with primary care doctor for medication adjustment for neuropathy.  Given return precautions and discharged in ED in good condition.  This chart was dictated using voice recognition software.  Despite best efforts to proofread,  errors can occur which can change the documentation meaning.     Final Clinical Impression(s) / ED Diagnoses Final diagnoses:  Weakness    Rx / DC Orders ED Discharge Orders    None       Lennice Sites, DO 09/20/19 2026

## 2019-09-20 NOTE — ED Triage Notes (Signed)
Pt reports "feeling funny" for a couple of days. Voices concern that her BP and blood sugar are elevated. C/o pain in feet from neuropathy. Denies chest pain. Alert

## 2019-09-23 ENCOUNTER — Other Ambulatory Visit: Payer: Self-pay | Admitting: Podiatry

## 2019-09-23 MED ORDER — GABAPENTIN 300 MG PO CAPS: 300.0000 mg | ORAL_CAPSULE | Freq: Three times a day (TID) | ORAL | 3 refills | Status: DC

## 2019-09-24 ENCOUNTER — Telehealth: Payer: Self-pay | Admitting: *Deleted

## 2019-09-24 NOTE — Telephone Encounter (Signed)
Pt's dtr, Mariah Mason states they would like something for the pain and numbness in pt's feet.

## 2019-09-25 NOTE — Telephone Encounter (Signed)
Valery:  Dr. Charlsie Merles will not be giving patient pain medicine for this. Patient was last seen by Dr. Charlsie Merles on 07/02/19.  Patient has low to mid grade neuropathy.  Thank you,  Hannah Beat, CMA (AAMA)

## 2019-09-30 NOTE — Telephone Encounter (Signed)
Fine to refer.

## 2019-10-01 NOTE — Telephone Encounter (Signed)
Unable to leave a message for pt's dtr, Dondra Prader informing that pt would be referred to [facility]. Mailed letter informing pt of Dr. Beverlee Nims referral.

## 2019-10-05 ENCOUNTER — Ambulatory Visit (INDEPENDENT_AMBULATORY_CARE_PROVIDER_SITE_OTHER): Payer: Medicare Other | Admitting: Podiatry

## 2019-10-05 ENCOUNTER — Other Ambulatory Visit: Payer: Self-pay

## 2019-10-05 ENCOUNTER — Encounter: Payer: Self-pay | Admitting: Podiatry

## 2019-10-05 DIAGNOSIS — E1142 Type 2 diabetes mellitus with diabetic polyneuropathy: Secondary | ICD-10-CM | POA: Diagnosis not present

## 2019-10-05 DIAGNOSIS — M79676 Pain in unspecified toe(s): Secondary | ICD-10-CM

## 2019-10-05 DIAGNOSIS — B351 Tinea unguium: Secondary | ICD-10-CM | POA: Diagnosis not present

## 2019-10-05 DIAGNOSIS — M79609 Pain in unspecified limb: Secondary | ICD-10-CM

## 2019-10-05 MED ORDER — NONFORMULARY OR COMPOUNDED ITEM: 3 refills | Status: AC

## 2019-10-05 NOTE — Patient Instructions (Addendum)
[facility] [phone] Neuropathy Cream    Diabetes Mellitus and Foot Care Foot care is an important part of your health, especially when you have diabetes. Diabetes may cause you to have problems because of poor blood flow (circulation) to your feet and legs, which can cause your skin to:  Become thinner and drier.  Break more easily.  Heal more slowly.  Peel and crack. You may also have nerve damage (neuropathy) in your legs and feet, causing decreased feeling in them. This means that you may not notice minor injuries to your feet that could lead to more serious problems. Noticing and addressing any potential problems early is the best way to prevent future foot problems. How to care for your feet Foot hygiene  Wash your feet daily with warm water and mild soap. Do not use hot water. Then, pat your feet and the areas between your toes until they are completely dry. Do not soak your feet as this can dry your skin.  Trim your toenails straight across. Do not dig under them or around the cuticle. File the edges of your nails with an emery board or nail file.  Apply a moisturizing lotion or petroleum jelly to the skin on your feet and to dry, brittle toenails. Use lotion that does not contain alcohol and is unscented. Do not apply lotion between your toes. Shoes and socks  Wear clean socks or stockings every day. Make sure they are not too tight. Do not wear knee-high stockings since they may decrease blood flow to your legs.  Wear shoes that fit properly and have enough cushioning. Always look in your shoes before you put them on to be sure there are no objects inside.  To break in new shoes, wear them for just a few hours a day. This prevents injuries on your feet. Wounds, scrapes, corns, and calluses  Check your feet daily for blisters, cuts, bruises, sores, and redness. If you cannot see the bottom of your feet, use a mirror or ask someone for help.  Do not cut corns  or calluses or try to remove them with medicine.  If you find a minor scrape, cut, or break in the skin on your feet, keep it and the skin around it clean and dry. You may clean these areas with mild soap and water. Do not clean the area with peroxide, alcohol, or iodine.  If you have a wound, scrape, corn, or callus on your foot, look at it several times a day to make sure it is healing and not infected. Check for: ? Redness, swelling, or pain. ? Fluid or blood. ? Warmth. ? Pus or a bad smell. General instructions  Do not cross your legs. This may decrease blood flow to your feet.  Do not use heating pads or hot water bottles on your feet. They may burn your skin. If you have lost feeling in your feet or legs, you may not know this is happening until it is too late.  Protect your feet from hot and cold by wearing shoes, such as at the beach or on hot pavement.  Schedule a complete foot exam at least once a year (annually) or more often if you have foot problems. If you have foot problems, report any cuts, sores, or bruises to your health care provider immediately. Contact a health care provider if:  You have a medical condition that increases your risk of infection and you have any cuts, sores, or bruises on your feet.    You have an injury that is not healing.  You have redness on your legs or feet.  You feel burning or tingling in your legs or feet.  You have pain or cramps in your legs and feet.  Your legs or feet are numb.  Your feet always feel cold.  You have pain around a toenail. Get help right away if:  You have a wound, scrape, corn, or callus on your foot and: ? You have pain, swelling, or redness that gets worse. ? You have fluid or blood coming from the wound, scrape, corn, or callus. ? Your wound, scrape, corn, or callus feels warm to the touch. ? You have pus or a bad smell coming from the wound, scrape, corn, or callus. ? You have a fever. ? You have a red  line going up your leg. Summary  Check your feet every day for cuts, sores, red spots, swelling, and blisters.  Moisturize feet and legs daily.  Wear shoes that fit properly and have enough cushioning.  If you have foot problems, report any cuts, sores, or bruises to your health care provider immediately.  Schedule a complete foot exam at least once a year (annually) or more often if you have foot problems. This information is not intended to replace advice given to you by your health care provider. Make sure you discuss any questions you have with your health care provider. Document Revised: 01/07/2019 Document Reviewed: 05/18/2016 Elsevier Patient Education  2020 Elsevier Inc.  

## 2019-10-07 ENCOUNTER — Other Ambulatory Visit: Payer: Self-pay | Admitting: Family Medicine

## 2019-10-07 DIAGNOSIS — E1169 Type 2 diabetes mellitus with other specified complication: Secondary | ICD-10-CM

## 2019-10-08 ENCOUNTER — Other Ambulatory Visit: Payer: Self-pay | Admitting: Family Medicine

## 2019-10-09 NOTE — Progress Notes (Signed)
Subjective: Mariah Mason presents today for annual diabetic foot examination and painful mycotic nails b/l that are difficult to trim. Pain interferes with ambulation. Aggravating factors include wearing enclosed shoe gear. Pain is relieved with periodic professional debridement.   Patient states she was given prescription for gabapentin and stopped taking it due to excessive drowsiness. She initially took it as prescribed and was having difficulty staying awake every morning. This would last into the afternoon. She decreased dosage until before bedtime and still had the same results. She states she will no longer take the medication.   Charlott Rakes, MD is patient's PCP. Last visit was: 08/12/2019.  Past Medical History:  Diagnosis Date  . Diabetes mellitus without complication ([place])   . Gout   . Hyperlipemia   . Hypertension   . Neuropathy      Current Outpatient Medications on File Prior to Visit  Medication Sig Dispense Refill  . allopurinol (ZYLOPRIM) 100 MG tablet Take 1 tablet (100 mg total) by mouth daily. 30 tablet 6  . amLODipine (NORVASC) 10 MG tablet Take 1 tablet (10 mg total) by mouth daily. 30 tablet 6  . blood glucose meter kit and supplies KIT Dispense based on patient and insurance preference. Use up to four times daily as directed. (FOR ICD-9 250.00, 250.01). 1 each 0  . carvedilol (COREG) 12.5 MG tablet Take 1.5 tablets (18.75 mg total) by mouth 2 (two) times daily with a meal. 90 tablet 6  . cholecalciferol (VITAMIN D3) 25 MCG (1000 UT) tablet Take 1,000 Units by mouth daily.    . colchicine 0.6 MG tablet Take 2 tablets (1.2 mg) at the onset of a gout attack, may repeat 1 tablet (0.6 mg) in 2 hours if symptoms persist 30 tablet 1  . cycloSPORINE (RESTASIS) 0.05 % ophthalmic emulsion Place 1 drop into both eyes 2 (two) times daily.     . fluticasone (FLONASE) 50 MCG/ACT nasal spray Place 1 spray into both nostrils daily as needed for allergies.    . Misc. Devices [place]  Shower chair. Dx- Diabetes mellitus 1 each 0  . Multiple Vitamins-Minerals (CENTRUM SILVER ADULT 50+ PO) Take 1 tablet by mouth daily.    . pravastatin (PRAVACHOL) 40 MG tablet Take 1 tablet (40 mg total) by mouth daily. 90 tablet 0  . sitaGLIPtin (JANUVIA) 50 MG tablet Take 1 tablet (50 mg total) by mouth daily. 30 tablet 6  . traMADol (ULTRAM) 50 MG tablet Take 50 mg by mouth at bedtime as needed.     No current facility-administered medications on file prior to visit.     Allergies  Allergen Reactions  . Erythromycin Other (See Comments)    Objective: Mariah Mason is a pleasant 69 y.o. y.o. Patient Race: Black or African American [2]  female in NAD. AAO x 3.  There were no vitals filed for this visit.  Vascular Examination: Neurovascular status unchanged b/l lower extremities. Capillary refill time to digits immediate b/l. Palpable DP pulses b/l. Palpable PT pulses b/l. Pedal hair sparse b/l. Skin temperature gradient within normal limits b/l. No pain with calf compression b/l. No edema noted b/l.  Dermatological Examination: Pedal skin with normal turgor, texture and tone bilaterally. No open wounds bilaterally. No interdigital macerations bilaterally. Toenails 1-5 b/l elongated, discolored, dystrophic, thickened, crumbly with subungual debris and tenderness to dorsal palpation.  Musculoskeletal: Normal muscle strength 5/5 to all lower extremity muscle groups bilaterally. No pain crepitus or joint limitation noted with ROM b/l. No gross bony deformities bilaterally.  Patient ambulates independent of any assistive aids.  Neurological Examination: Protective sensation intact 5/5 intact bilaterally with 10g monofilament b/l. Vibratory sensation intact b/l. Proprioception intact bilaterally.  Assessment: 1. Pain due to onychomycosis of nail   2. Diabetic peripheral neuropathy associated with type 2 diabetes mellitus ([place])   Plan: -Examined patient. -Discontinue gabapentin.  Discussed topical option for neuropathy cream and she would like to try it. A prescription was sent to [facility] for peripheral neuropathy cream which consists of: Bupivacaine 1%, doxepin 3%, gabapentin 6%, pentoxifylline 3%, and Topimarate 1%. Apply 1-2 grams to feet 3-4 times daily as needed for foot pain. -Toenails 1-5 b/l were debrided in length and girth with sterile nail nippers and dremel without iatrogenic bleeding.  -Patient/POA to call should there be question/concern in the interim. -Patient to continue soft, supportive shoe gear daily. -Patient to report any pedal injuries to medical professional immediately.  Return in about 3 months (around 01/05/2020) for diabetic nail trim.  [name], DPM

## 2019-10-15 ENCOUNTER — Other Ambulatory Visit: Payer: Self-pay | Admitting: Family Medicine

## 2019-10-15 DIAGNOSIS — M1A00X0 Idiopathic chronic gout, unspecified site, without tophus (tophi): Secondary | ICD-10-CM

## 2019-10-16 ENCOUNTER — Other Ambulatory Visit: Payer: Self-pay | Admitting: Family Medicine

## 2019-10-16 DIAGNOSIS — M1A00X0 Idiopathic chronic gout, unspecified site, without tophus (tophi): Secondary | ICD-10-CM

## 2019-10-22 ENCOUNTER — Other Ambulatory Visit: Payer: Self-pay

## 2019-10-22 ENCOUNTER — Ambulatory Visit
Admission: RE | Admit: 2019-10-22 | Discharge: 2019-10-22 | Disposition: A | Discharge status: Home / Self Care | Payer: Medicare Other | Source: Ambulatory Visit | Attending: Family Medicine | Admitting: Family Medicine

## 2019-10-22 DIAGNOSIS — E2839 Other primary ovarian failure: Secondary | ICD-10-CM

## 2019-10-26 ENCOUNTER — Telehealth: Payer: Self-pay

## 2019-10-26 NOTE — Telephone Encounter (Signed)
Patient name and DOB has been verified Patient was informed of lab results. Patient had no questions.  

## 2019-10-26 NOTE — Telephone Encounter (Signed)
-----   Message from Hoy Register, MD sent at 10/23/2019  8:21 AM EDT ----- Bone density reveals she has slight bone thinning but not Osteoporosis. OTC Calcium and vit D will be beneficial

## 2019-10-28 ENCOUNTER — Other Ambulatory Visit: Payer: Self-pay | Admitting: Family Medicine

## 2019-10-28 DIAGNOSIS — I10 Essential (primary) hypertension: Secondary | ICD-10-CM

## 2019-11-04 ENCOUNTER — Other Ambulatory Visit: Payer: Self-pay

## 2019-11-05 ENCOUNTER — Other Ambulatory Visit: Payer: Self-pay | Admitting: Family Medicine

## 2019-11-05 DIAGNOSIS — E1169 Type 2 diabetes mellitus with other specified complication: Secondary | ICD-10-CM

## 2019-11-11 ENCOUNTER — Other Ambulatory Visit: Payer: Self-pay

## 2019-11-11 ENCOUNTER — Ambulatory Visit: Payer: Medicare Other | Attending: Family Medicine | Admitting: Family Medicine

## 2019-11-11 ENCOUNTER — Encounter: Payer: Self-pay | Admitting: Family Medicine

## 2019-11-11 VITALS — BP 155/73 | HR 83 | Ht 64.0 in | Wt 205.0 lb

## 2019-11-11 DIAGNOSIS — I10 Essential (primary) hypertension: Secondary | ICD-10-CM | POA: Diagnosis not present

## 2019-11-11 DIAGNOSIS — M1A00X0 Idiopathic chronic gout, unspecified site, without tophus (tophi): Secondary | ICD-10-CM

## 2019-11-11 DIAGNOSIS — E1169 Type 2 diabetes mellitus with other specified complication: Secondary | ICD-10-CM

## 2019-11-11 DIAGNOSIS — N1832 Chronic kidney disease, stage 3b: Secondary | ICD-10-CM

## 2019-11-11 DIAGNOSIS — Z794 Long term (current) use of insulin: Secondary | ICD-10-CM

## 2019-11-11 DIAGNOSIS — E1122 Type 2 diabetes mellitus with diabetic chronic kidney disease: Secondary | ICD-10-CM

## 2019-11-11 DIAGNOSIS — E1142 Type 2 diabetes mellitus with diabetic polyneuropathy: Secondary | ICD-10-CM | POA: Diagnosis not present

## 2019-11-11 DIAGNOSIS — E1121 Type 2 diabetes mellitus with diabetic nephropathy: Secondary | ICD-10-CM

## 2019-11-11 LAB — POCT GLYCOSYLATED HEMOGLOBIN (HGB A1C): HbA1c, POC (controlled diabetic range): 6.9 % (ref 0.0–7.0)

## 2019-11-11 LAB — GLUCOSE, POCT (MANUAL RESULT ENTRY): POC Glucose: 136 mg/dl — AB (ref 70–99)

## 2019-11-11 MED ORDER — DULOXETINE HCL 60 MG PO CPEP: 60.0000 mg | ORAL_CAPSULE | Freq: Every day | ORAL | 3 refills | Status: DC

## 2019-11-11 MED ORDER — COLCHICINE 0.6 MG PO TABS: ORAL_TABLET | ORAL | 1 refills | Status: DC

## 2019-11-11 MED ORDER — SITAGLIPTIN PHOSPHATE 50 MG PO TABS: ORAL_TABLET | ORAL | 0 refills | Status: DC

## 2019-11-11 MED ORDER — CARVEDILOL 25 MG PO TABS: 25.0000 mg | ORAL_TABLET | Freq: Two times a day (BID) | ORAL | 1 refills | Status: DC

## 2019-11-11 NOTE — Progress Notes (Signed)
Subjective:  Patient ID: Mariah Mason, female    DOB: 1951/03/25  Age: 69 y.o. MRN: [id number]  CC: Diabetes   HPI Mariah Mason presents is a 69 year old female with a history of hypertension, type 2 diabetes mellitus (A1c 6.9) previously followed by [facility] who presents today for follow-up visit. She has Neuropathy, with pain in her feet; Gabapentin tried by podiatry made her dizzy and changed her mood. With regards to diabetes mellitus she denies presence of hypoglycemia and is compliant with her current regimen.  Denies visual concerns.  Also on a statin. Compliant with her antihypertensive but her blood pressure is slightly elevated. Her mammogram comes up in 12/2019 Past Medical History:  Diagnosis Date  . Diabetes mellitus without complication ([place])   . Gout   . Hyperlipemia   . Hypertension   . Neuropathy     History reviewed. No pertinent surgical history.  History reviewed. No pertinent family history.  Allergies  Allergen Reactions  . Erythromycin Other (See Comments)    Outpatient Medications Prior to Visit  Medication Sig Dispense Refill  . allopurinol (ZYLOPRIM) 100 MG tablet TAKE 1 TABLET BY MOUTH EVERY DAY 90 tablet 0  . amLODipine (NORVASC) 10 MG tablet TAKE 1 TABLET(10 MG) BY MOUTH DAILY 90 tablet 1  . blood glucose meter kit and supplies KIT Dispense based on patient and insurance preference. Use up to four times daily as directed. (FOR ICD-9 250.00, 250.01). 1 each 0  . cholecalciferol (VITAMIN D3) 25 MCG (1000 UT) tablet Take 1,000 Units by mouth daily.    . cycloSPORINE (RESTASIS) 0.05 % ophthalmic emulsion Place 1 drop into both eyes 2 (two) times daily.     . fluticasone (FLONASE) 50 MCG/ACT nasal spray Place 1 spray into both nostrils daily as needed for allergies.    . Misc. Devices [place]elow Shower chair. Dx- Diabetes mellitus 1 each 0  . Multiple Vitamins-Minerals (CENTRUM SILVER ADULT 50+ PO) Take 1 tablet by mouth daily.      . NONFORMULARY OR COMPOUNDED ITEM Peripheral Neuropathy Cream: Bupivacaine 1%, Doxepin 3%, Gabapentin 6%, Pentoxifylline 3%, Topiramate 1% Order faxed to [facility] 1 each 3  . pravastatin (PRAVACHOL) 40 MG tablet TAKE 1 TABLET(40 MG) BY MOUTH DAILY 90 tablet 0  . traMADol (ULTRAM) 50 MG tablet Take 50 mg by mouth at bedtime as needed.    . carvedilol (COREG) 12.5 MG tablet Take 1.5 tablets (18.75 mg total) by mouth 2 (two) times daily with a meal. 90 tablet 6  . colchicine 0.6 MG tablet Take 2 tablets (1.2 mg) at the onset of a gout attack, may repeat 1 tablet (0.6 mg) in 2 hours if symptoms persist 30 tablet 1  . JANUVIA 50 MG tablet TAKE 1 TABLET(50 MG) BY MOUTH DAILY 30 tablet 0   No facility-administered medications prior to visit.     ROS Review of Systems  Constitutional: Negative for activity change, appetite change and fatigue.  HENT: Negative for congestion, sinus pressure and sore throat.   Eyes: Negative for visual disturbance.  Respiratory: Negative for cough, chest tightness, shortness of breath and wheezing.   Cardiovascular: Negative for chest pain and palpitations.  Gastrointestinal: Negative for abdominal distention, abdominal pain and constipation.  Endocrine: Negative for polydipsia.  Genitourinary: Negative for dysuria and frequency.  Musculoskeletal: Negative for arthralgias and back pain.  Skin: Negative for rash.  Neurological: Positive for numbness. Negative for tremors and light-headedness.  Hematological: Does not bruise/bleed easily.  Psychiatric/Behavioral: Negative for agitation  and behavioral problems.    Objective:  BP (!) 155/73   Pulse 83   Ht 5' 4"  (1.626 m)   Wt 205 lb (93 kg)   SpO2 100%   BMI 35.19 kg/m   BP/Weight 11/11/2019 09/20/2019 7/85/8850  Systolic BP 277 412 878  Diastolic BP 73 88 73  Wt. (Lbs) 205 205 204  BMI 35.19 35.19 37.31      Physical Exam Constitutional:      Appearance: She is well-developed.  Neck:      Vascular: No JVD.  Cardiovascular:     Rate and Rhythm: Normal rate.     Heart sounds: Normal heart sounds. No murmur heard.   Pulmonary:     Effort: Pulmonary effort is normal.     Breath sounds: Normal breath sounds. No wheezing or rales.  Chest:     Chest wall: No tenderness.  Abdominal:     General: Bowel sounds are normal. There is no distension.     Palpations: Abdomen is soft. There is no mass.     Tenderness: There is no abdominal tenderness.  Musculoskeletal:        General: Normal range of motion.     Right lower leg: No edema.     Left lower leg: No edema.  Neurological:     Mental Status: She is alert and oriented to person, place, and time.  Psychiatric:        Mood and Affect: Mood normal.     CMP Latest Ref Rng & Units 09/20/2019 06/30/2019 03/17/2019  Glucose 70 - 99 mg/dL 175(H) 195(H) 227(H)  BUN 8 - 23 mg/dL 35(H) 33(H) 20  Creatinine 0.44 - 1.00 mg/dL 1.62(H) 1.44(H) 1.27(H)  Sodium 135 - 145 mmol/L 139 141 139  Potassium 3.5 - 5.1 mmol/L 4.6 4.4 4.7  Chloride 98 - 111 mmol/L 105 106 101  CO2 22 - 32 mmol/L 23 20 22   Calcium 8.9 - 10.3 mg/dL 9.2 10.0 9.7  Total Protein 6.0 - 8.5 g/dL - 7.6 7.4  Total Bilirubin 0.0 - 1.2 mg/dL - 0.2 0.3  Alkaline Phos 39 - 117 IU/L - 95 110  AST 0 - 40 IU/L - 12 14  ALT 0 - 32 IU/L - 13 14    Lipid Panel     Component Value Date/Time   CHOL 180 06/30/2019 0950   TRIG 126 06/30/2019 0950   HDL 43 06/30/2019 0950   CHOLHDL 4.2 06/30/2019 0950   LDLCALC 114 (H) 06/30/2019 0950    CBC    Component Value Date/Time   WBC 7.5 09/20/2019 1938   RBC 4.33 09/20/2019 1938   HGB 11.9 (L) 09/20/2019 1938   HCT 36.1 09/20/2019 1938   PLT 232 09/20/2019 1938   MCV 83.4 09/20/2019 1938   MCH 27.5 09/20/2019 1938   MCHC 33.0 09/20/2019 1938   RDW 12.8 09/20/2019 1938   LYMPHSABS 1.7 09/20/2019 1938   MONOABS 0.5 09/20/2019 1938   EOSABS 0.2 09/20/2019 1938   BASOSABS 0.0 09/20/2019 1938    Lab Results   Component Value Date   HGBA1C 6.9 11/11/2019    Assessment & Plan:  1. Type 2 diabetes mellitus with other specified complication, with long-term current use of insulin (HCC) Controlled with A1c of 6.9 Continue current regimen Counseled on Diabetic diet, my plate method, 676 minutes of moderate intensity exercise/week Blood sugar logs with fasting goals of 80-120 mg/dl, random of less than 180 and in the event of sugars less than 60  mg/dl or greater than 400 mg/dl encouraged to notify the clinic. Advised on the need for annual eye exams, annual foot exams, Pneumonia vaccine. - POCT glucose (manual entry) - POCT glycosylated hemoglobin (Hb A1C) - CMP14+EGFR - sitaGLIPtin (JANUVIA) 50 MG tablet; TAKE 1 TABLET(50 MG) BY MOUTH DAILY  Dispense: 90 tablet; Refill: 0  2. Essential hypertension Uncontrolled Increase Coreg dose Counseled on blood pressure goal of less than 130/80, low-sodium, DASH diet, medication compliance, 150 minutes of moderate intensity exercise per week. Discussed medication compliance, adverse effects. - carvedilol (COREG) 25 MG tablet; Take 1 tablet (25 mg total) by mouth 2 (two) times daily with a meal.  Dispense: 90 tablet; Refill: 1  3. Idiopathic chronic gout without tophus, unspecified site Stable - colchicine 0.6 MG tablet; Take 2 tablets (1.2 mg) at the onset of a gout attack, may repeat 1 tablet (0.6 mg) in 2 hours if symptoms persist  Dispense: 30 tablet; Refill: 1  4. Diabetic polyneuropathy associated with type 2 diabetes mellitus ([place]) Uncontrolled Unable to tolerate gabapentin previously prescribed by podiatry Trial of Cymbalta Advised that if symptoms do not improve I will refer her to neurology as requested but at this time we have not exhausted options with him primary care - DULoxetine (CYMBALTA) 60 MG capsule; Take 1 capsule (60 mg total) by mouth daily.  Dispense: 30 capsule; Refill: 3  5. Type 2 diabetes mellitus with stage 3b chronic kidney  disease, without long-term current use of insulin (HCC) Diabetic nephropathy Avoid nephrotoxins    Meds ordered this encounter  Medications  . DULoxetine (CYMBALTA) 60 MG capsule    Sig: Take 1 capsule (60 mg total) by mouth daily.    Dispense:  30 capsule    Refill:  3  . carvedilol (COREG) 25 MG tablet    Sig: Take 1 tablet (25 mg total) by mouth 2 (two) times daily with a meal.    Dispense:  90 tablet    Refill:  1    Dose change  . colchicine 0.6 MG tablet    Sig: Take 2 tablets (1.2 mg) at the onset of a gout attack, may repeat 1 tablet (0.6 mg) in 2 hours if symptoms persist    Dispense:  30 tablet    Refill:  1  . sitaGLIPtin (JANUVIA) 50 MG tablet    Sig: TAKE 1 TABLET(50 MG) BY MOUTH DAILY    Dispense:  90 tablet    Refill:  0    Follow-up: Return in about 3 months (around 02/11/2020) for Chronic disease management.       Charlott Rakes, MD, FAAFP. [facility] [place], [place]   11/11/2019, 12:30 PM

## 2019-11-11 NOTE — Patient Instructions (Signed)
Neuropathic Pain Neuropathic pain is pain caused by damage to the nerves that are responsible for certain sensations in your body (sensory nerves). The pain can be caused by:  Damage to the sensory nerves that send signals to your spinal cord and brain (peripheral nervous system).  Damage to the sensory nerves in your brain or spinal cord (central nervous system). Neuropathic pain can make you more sensitive to pain. Even a minor sensation can feel very painful. This is usually a long-term condition that can be difficult to treat. The type of pain differs from person to person. It may:  Start suddenly (acute), or it may develop slowly and last for a long time (chronic).  Come and go as damaged nerves heal, or it may stay at the same level for years.  Cause emotional distress, loss of sleep, and a lower quality of life. What are the causes? The most common cause of this condition is diabetes. Many other diseases and conditions can also cause neuropathic pain. Causes of neuropathic pain can be classified as:  Toxic. This is caused by medicines and chemicals. The most common cause of toxic neuropathic pain is damage from cancer treatments (chemotherapy).  Metabolic. This can be caused by: ? Diabetes. This is the most common disease that damages the nerves. ? Lack of vitamin B from long-term alcohol abuse.  Traumatic. Any injury that cuts, crushes, or stretches a nerve can cause damage and pain. A common example is feeling pain after losing an arm or leg (phantom limb pain).  Compression-related. If a sensory nerve gets trapped or compressed for a long period of time, the blood supply to the nerve can be cut off.  Vascular. Many blood vessel diseases can cause neuropathic pain by decreasing blood supply and oxygen to nerves.  Autoimmune. This type of pain results from diseases in which the body's defense system (immune system) mistakenly attacks sensory nerves. Examples of autoimmune diseases  that can cause neuropathic pain include lupus and multiple sclerosis.  Infectious. Many types of viral infections can damage sensory nerves and cause pain. Shingles infection is a common cause of this type of pain.  Inherited. Neuropathic pain can be a symptom of many diseases that are passed down through families (genetic). What increases the risk? You are more likely to develop this condition if:  You have diabetes.  You smoke.  You drink too much alcohol.  You are taking certain medicines, including medicines that kill cancer cells (chemotherapy) or that treat immune system disorders. What are the signs or symptoms? The main symptom is pain. Neuropathic pain is often described as:  Burning.  Shock-like.  Stinging.  Hot or cold.  Itching. How is this diagnosed? No single test can diagnose neuropathic pain. It is diagnosed based on:  Physical exam and your symptoms. Your health care provider will ask you about your pain. You may be asked to use a pain scale to describe how bad your pain is.  Tests. These may be done to see if you have a high sensitivity to pain and to help find the cause and location of any sensory nerve damage. They include: ? Nerve conduction studies to test how well nerve signals travel through your sensory nerves (electrodiagnostic testing). ? Stimulating your sensory nerves through electrodes on your skin and measuring the response in your spinal cord and brain (somatosensory evoked potential).  Imaging studies, such as: ? X-rays. ? CT scan. ? MRI. How is this treated? Treatment for neuropathic pain may change   over time. You may need to try different treatment options or a combination of treatments. Some options include:  Treating the underlying cause of the neuropathy, such as diabetes, kidney disease, or vitamin deficiencies.  Stopping medicines that can cause neuropathy, such as chemotherapy.  Medicine to relieve pain. Medicines may  include: ? Prescription or over-the-counter pain medicine. ? Anti-seizure medicine. ? Antidepressant medicines. ? Pain-relieving patches that are applied to painful areas of skin. ? A medicine to numb the area (local anesthetic), which can be injected as a nerve block.  Transcutaneous nerve stimulation. This uses electrical currents to block painful nerve signals. The treatment is painless.  Alternative treatments, such as: ? Acupuncture. ? Meditation. ? Massage. ? Physical therapy. ? Pain management programs. ? Counseling. Follow these instructions at home: Medicines   Take over-the-counter and prescription medicines only as told by your health care provider.  Do not drive or use heavy machinery while taking prescription pain medicine.  If you are taking prescription pain medicine, take actions to prevent or treat constipation. Your health care provider may recommend that you: ? Drink enough fluid to keep your urine pale yellow. ? Eat foods that are high in fiber, such as fresh fruits and vegetables, whole grains, and beans. ? Limit foods that are high in fat and processed sugars, such as fried or sweet foods. ? Take an over-the-counter or prescription medicine for constipation. Lifestyle   Have a good support system at home.  Consider joining a chronic pain support group.  Do not use any products that contain nicotine or tobacco, such as cigarettes and e-cigarettes. If you need help quitting, ask your health care provider.  Do not drink alcohol. General instructions  Learn as much as you can about your condition.  Work closely with all your health care providers to find the treatment plan that works best for you.  Ask your health care provider what activities are safe for you.  Keep all follow-up visits as told by your health care provider. This is important. Contact a health care provider if:  Your pain treatments are not working.  You are having side effects  from your medicines.  You are struggling with tiredness (fatigue), mood changes, depression, or anxiety. Summary  Neuropathic pain is pain caused by damage to the nerves that are responsible for certain sensations in your body (sensory nerves).  Neuropathic pain may come and go as damaged nerves heal, or it may stay at the same level for years.  Neuropathic pain is usually a long-term condition that can be difficult to treat. Consider joining a chronic pain support group. This information is not intended to replace advice given to you by your health care provider. Make sure you discuss any questions you have with your health care provider. Document Revised: 08/07/2018 Document Reviewed: 05/03/2017 Elsevier Patient Education  2020 [employer]vier Inc.  

## 2019-11-12 LAB — CMP14+EGFR
ALT: 15 IU/L (ref 0–32)
AST: 16 IU/L (ref 0–40)
Albumin/Globulin Ratio: 1.2 (ref 1.2–2.2)
Albumin: 4.3 g/dL (ref 3.8–4.8)
Alkaline Phosphatase: 84 IU/L (ref 48–121)
BUN/Creatinine Ratio: 23 (ref 12–28)
BUN: 35 mg/dL — ABNORMAL HIGH (ref 8–27)
Bilirubin Total: 0.3 mg/dL (ref 0.0–1.2)
CO2: 22 mmol/L (ref 20–29)
Calcium: 10.1 mg/dL (ref 8.7–10.3)
Chloride: 105 mmol/L (ref 96–106)
Creatinine, Ser: 1.5 mg/dL — ABNORMAL HIGH (ref 0.57–1.00)
GFR calc Af Amer: 41 mL/min/{1.73_m2} — ABNORMAL LOW (ref >59)
GFR calc non Af Amer: 35 mL/min/{1.73_m2} — ABNORMAL LOW (ref >59)
Globulin, Total: 3.5 g/dL (ref 1.5–4.5)
Glucose: 126 mg/dL — ABNORMAL HIGH (ref 65–99)
Potassium: 5 mmol/L (ref 3.5–5.2)
Sodium: 143 mmol/L (ref 134–144)
Total Protein: 7.8 g/dL (ref 6.0–8.5)

## 2019-11-13 ENCOUNTER — Telehealth: Payer: Self-pay

## 2019-11-13 NOTE — Telephone Encounter (Signed)
-----   Message from Hoy Register, MD sent at 11/13/2019  9:14 AM EDT ----- Labs reveal abnormal kidney function which is stable compared to previous labs.  Please advised to keep appointment with nephrologist.

## 2019-11-13 NOTE — Telephone Encounter (Signed)
Patient name and DOB has been verified Patient was informed of lab results. Patient had no questions.  

## 2019-11-16 ENCOUNTER — Telehealth: Payer: Self-pay | Admitting: Family Medicine

## 2019-11-16 NOTE — Telephone Encounter (Signed)
Advise to take half a tablet daily

## 2019-11-16 NOTE — Telephone Encounter (Signed)
Copied from CRM [id number]. Topic: General - Other >> Nov 16, 2019  8:47 AM Mariah Mason wrote: Reason for CRM: Patient called to ask the nurse or doctor to call her regarding her cholesterol medication which she takes at night and she said it is causing pulling on her legs.  She would like to talk to the doctor further.  Please advise and call to discuss at [phone]

## 2019-11-16 NOTE — Telephone Encounter (Signed)
Will route to PCP for review. 

## 2019-11-17 NOTE — Telephone Encounter (Signed)
Patient states that she took a half of a pill last night and she still has the pulling in her feet and legs. She states that it may be the Cymbalta.

## 2019-11-17 NOTE — Telephone Encounter (Signed)
Initially she stated it was the cholesterol medication and now she states she thinks it is Cymbalta so this has me confused.  She can try holding off on the Cymbalta to see if she would have improvement.

## 2019-11-19 NOTE — Telephone Encounter (Signed)
States that she has stopped taking the Cymbalta and she states that she has no pulling in legs and feet.

## 2019-12-01 ENCOUNTER — Other Ambulatory Visit: Payer: Self-pay | Admitting: Family Medicine

## 2019-12-01 DIAGNOSIS — M1A00X0 Idiopathic chronic gout, unspecified site, without tophus (tophi): Secondary | ICD-10-CM

## 2019-12-01 NOTE — Telephone Encounter (Signed)
Requested Prescriptions  Pending Prescriptions Disp Refills  . allopurinol (ZYLOPRIM) 100 MG tablet [Pharmacy Med Name: ALLOPURINOL 100MG  TABLETS] 90 tablet 0    Sig: TAKE 1 TABLET BY MOUTH EVERY DAY     Endocrinology:  Gout Agents Failed - 12/01/2019 12:47 PM      Failed - Uric Acid in normal range and within 360 days    No results found for: POCURA, LABURIC       Failed - Cr in normal range and within 360 days    Creatinine, Ser  Date Value Ref Range Status  11/11/2019 1.50 (H) 0.57 - 1.00 mg/dL Final         Passed - Valid encounter within last 12 months    Recent Outpatient Visits          2 weeks ago Type 2 diabetes mellitus with other specified complication, with long-term current use of insulin (HCC)   [facility] [place], [place], MD   3 months ago Type 2 diabetes mellitus with other specified complication, with long-term current use of insulin (HCC)   [facility] [place], [place], MD   5 months ago Type 2 diabetes mellitus with other specified complication, with long-term current use of insulin (HCC)   [facility] [place], [place], MD   5 months ago Chronic pain of both feet   [facility] [place], [place], MD   8 months ago Type 2 diabetes mellitus with other specified complication, with long-term current use of insulin (HCC)   [facility] [place], MD      Future Appointments            In 2 months Hoy Register, MD [facility]

## 2019-12-03 ENCOUNTER — Other Ambulatory Visit: Payer: Self-pay | Admitting: Family Medicine

## 2019-12-03 DIAGNOSIS — Z794 Long term (current) use of insulin: Secondary | ICD-10-CM

## 2019-12-03 NOTE — Telephone Encounter (Signed)
Rx written 11/01/19 #90 0 RF- sent as no print- not received y pharmacy.

## 2019-12-04 ENCOUNTER — Other Ambulatory Visit: Payer: Medicaid Other

## 2019-12-09 ENCOUNTER — Telehealth: Payer: Self-pay

## 2019-12-09 NOTE — Telephone Encounter (Signed)
Pt called stating pravastatin causing feet and legs to pull and is not working any longer. Pt request change to another cholesterol medication. Pt uses [facility].

## 2019-12-10 NOTE — Telephone Encounter (Signed)
Patient is calling to check the status of the below request. Patient will not be home tomorrow and she did not want to miss the call.  Please CB- There is is no VM. [phone]

## 2019-12-11 NOTE — Telephone Encounter (Signed)
Please advise 

## 2019-12-11 NOTE — Telephone Encounter (Signed)
Patient is requesting another medication for her cholesterol. Due to current medication causing her legs to cramp.

## 2019-12-12 NOTE — Telephone Encounter (Signed)
Please advise her to reduce her dose to half of her pill and symptoms might improve. She is on one of the lowest potency cholesterol pills.

## 2019-12-14 MED ORDER — EZETIMIBE 10 MG PO TABS
10.0000 mg | ORAL_TABLET | Freq: Every day | ORAL | 3 refills | Status: DC
Start: 2019-12-14 — End: 2020-02-11

## 2019-12-14 NOTE — Telephone Encounter (Signed)
I have sent a prescription for Zetia to her pharmacy which she can take instead

## 2019-12-14 NOTE — Telephone Encounter (Signed)
Patient has stated that she has already tried to take 1/2 pill and still has the pulling in her legs.

## 2019-12-14 NOTE — Telephone Encounter (Signed)
Patient was called and informed of medication change  

## 2019-12-21 ENCOUNTER — Telehealth: Payer: Self-pay | Admitting: Family Medicine

## 2019-12-21 DIAGNOSIS — E1169 Type 2 diabetes mellitus with other specified complication: Secondary | ICD-10-CM

## 2019-12-21 NOTE — Telephone Encounter (Signed)
Will route to PCP for review. 

## 2019-12-21 NOTE — Telephone Encounter (Signed)
Patient called to ask the nurse to call her regarding her cholesterol medication.  She stated that her medication is making her feel very sick.  She said every med. That she is prescribing is making her feel sick.  She thinks that her blood needs to be checked.  Please advise and call patient back as soon as possible. At [phone]

## 2019-12-21 NOTE — Telephone Encounter (Signed)
She can discontinue Zetia if she is unable to tolerate it.

## 2019-12-22 NOTE — Telephone Encounter (Signed)
She needs to be on a cholesterol medication but she is not able to tolerate the different ones I have placed her on even with reduced doses. I have ordered labs as requested.

## 2019-12-22 NOTE — Telephone Encounter (Signed)
Patient would like to get her cholesterol levels checked to make sure that she does not need to be on any medication since she can not tolerate.

## 2019-12-23 ENCOUNTER — Other Ambulatory Visit: Payer: Self-pay

## 2019-12-23 DIAGNOSIS — Z794 Long term (current) use of insulin: Secondary | ICD-10-CM

## 2019-12-23 NOTE — Telephone Encounter (Signed)
Patient was called and patient states that she wants to go to [facility] in [place] to get her labs drawn.  Spoke with Lab tech at [facility] and she informed me that patient can get labs done in [place], Patient was instructed to contact [facility] in [place] and set up an appointment.

## 2019-12-25 ENCOUNTER — Telehealth: Payer: Self-pay

## 2019-12-25 LAB — LIPID PANEL
Chol/HDL Ratio: 5.2 ratio — ABNORMAL HIGH (ref 0.0–4.4)
Cholesterol, Total: 166 mg/dL (ref 100–199)
HDL: 32 mg/dL — ABNORMAL LOW (ref >39)
LDL Chol Calc (NIH): 101 mg/dL — ABNORMAL HIGH (ref 0–99)
Triglycerides: 188 mg/dL — ABNORMAL HIGH (ref 0–149)
VLDL Cholesterol Cal: 33 mg/dL (ref 5–40)

## 2019-12-25 NOTE — Telephone Encounter (Signed)
Patient name and DOB has been verified Patient was informed of lab results. Patient had no questions.  

## 2019-12-25 NOTE — Telephone Encounter (Signed)
-----   Message from Hoy Register, MD sent at 12/25/2019  8:14 AM EDT ----- Cholesterol, triglycerides are slightly elevated but has improved compared to 5 months ago.  Continue with a low-cholesterol diet and exercise since she is unable to tolerate cholesterol medications.

## 2020-01-12 ENCOUNTER — Encounter: Payer: Self-pay | Admitting: Podiatry

## 2020-01-12 ENCOUNTER — Other Ambulatory Visit: Payer: Self-pay

## 2020-01-12 ENCOUNTER — Ambulatory Visit (INDEPENDENT_AMBULATORY_CARE_PROVIDER_SITE_OTHER): Payer: Medicare Other | Admitting: Podiatry

## 2020-01-12 DIAGNOSIS — M2041 Other hammer toe(s) (acquired), right foot: Secondary | ICD-10-CM

## 2020-01-12 DIAGNOSIS — E1142 Type 2 diabetes mellitus with diabetic polyneuropathy: Secondary | ICD-10-CM | POA: Diagnosis not present

## 2020-01-12 DIAGNOSIS — M79676 Pain in unspecified toe(s): Secondary | ICD-10-CM | POA: Diagnosis not present

## 2020-01-12 DIAGNOSIS — B351 Tinea unguium: Secondary | ICD-10-CM

## 2020-01-12 DIAGNOSIS — M2042 Other hammer toe(s) (acquired), left foot: Secondary | ICD-10-CM

## 2020-01-12 NOTE — Progress Notes (Signed)
Subjective: Mariah Mason presents today for annual diabetic foot examination and painful mycotic nails b/l that are difficult to trim. Pain interferes with ambulation. Aggravating factors include wearing enclosed shoe gear. Pain is relieved with periodic professional debridement.   Patient relates her right great toenail cracked and she has been applying Vaseline Petroleum Jelly to keep it lubricated. She denies any redness, drainage or swelling of digit.   Last checked her blood sugar two days ago and it was 129 mg/dl.   She also had her mammogram done yesterday.  Mariah Rakes, MD is patient's PCP. Last visit was: 11/11/2019.  Past Medical History:  Diagnosis Date  . Diabetes mellitus without complication ([place])   . Gout   . Hyperlipemia   . Hypertension   . Neuropathy      Current Outpatient Medications on File Prior to Visit  Medication Sig Dispense Refill  . allopurinol (ZYLOPRIM) 100 MG tablet TAKE 1 TABLET BY MOUTH EVERY DAY 90 tablet 0  . amLODipine (NORVASC) 10 MG tablet TAKE 1 TABLET(10 MG) BY MOUTH DAILY 90 tablet 1  . blood glucose meter kit and supplies KIT Dispense based on patient and insurance preference. Use up to four times daily as directed. (FOR ICD-9 250.00, 250.01). 1 each 0  . carvedilol (COREG) 25 MG tablet Take 1 tablet (25 mg total) by mouth 2 (two) times daily with a meal. 90 tablet 1  . cholecalciferol (VITAMIN D3) 25 MCG (1000 UT) tablet Take 1,000 Units by mouth daily.    . colchicine 0.6 MG tablet Take 2 tablets (1.2 mg) at the onset of a gout attack, may repeat 1 tablet (0.6 mg) in 2 hours if symptoms persist 30 tablet 1  . cycloSPORINE (RESTASIS) 0.05 % ophthalmic emulsion Place 1 drop into both eyes 2 (two) times daily.     . DULoxetine (CYMBALTA) 60 MG capsule Take 1 capsule (60 mg total) by mouth daily. 30 capsule 3  . ezetimibe (ZETIA) 10 MG tablet Take 1 tablet (10 mg total) by mouth daily. 90 tablet 3  . fluticasone (FLONASE) 50 MCG/ACT nasal  spray Place 1 spray into both nostrils daily as needed for allergies.    . Misc. Devices [place] Shower chair. Dx- Diabetes mellitus 1 each 0  . Multiple Vitamins-Minerals (CENTRUM SILVER ADULT 50+ PO) Take 1 tablet by mouth daily.    . NONFORMULARY OR COMPOUNDED ITEM Peripheral Neuropathy Cream: Bupivacaine 1%, Doxepin 3%, Gabapentin 6%, Pentoxifylline 3%, Topiramate 1% Order faxed to [place] 1 each 3  . pravastatin (PRAVACHOL) 40 MG tablet TAKE 1 TABLET(40 MG) BY MOUTH DAILY 90 tablet 0  . sitaGLIPtin (JANUVIA) 50 MG tablet TAKE 1 TABLET(50 MG) BY MOUTH DAILY 90 tablet 0  . traMADol (ULTRAM) 50 MG tablet Take 50 mg by mouth at bedtime as needed.     No current facility-administered medications on file prior to visit.     Allergies  Allergen Reactions  . Erythromycin Other (See Comments)    Objective: Mariah Mason is a pleasant 69 y.o.  African American female, obese in NAD. AAO x 3.  There were no vitals filed for this visit.  Vascular Examination: Neurovascular status unchanged b/l lower extremities. Capillary refill time to digits immediate b/l. Palpable DP pulses b/l. Palpable PT pulses b/l. Pedal hair sparse b/l. Skin temperature gradient within normal limits b/l. No pain with calf compression b/l. No edema noted b/l.  Dermatological Examination: Pedal skin with normal turgor, texture and tone bilaterally. No open wounds bilaterally. No interdigital  macerations bilaterally. Toenails 1-5 b/l elongated, discolored, dystrophic, thickened, crumbly with subungual debris and tenderness to dorsal palpation.   She has evidence of cracked nailplate of right hallux and it has cracked into the medial nail border. No open wounds, no acute ingrown toenail, no nail border hypertrophy or drainage. No pain on palpation.  Musculoskeletal: Normal muscle strength 5/5 to all lower extremity muscle groups bilaterally. No pain crepitus or joint limitation noted with ROM b/l. Hammertoes  noted to the 2-5 bilaterally. Wearing appropriate fitting shoe gear. Utilizes cane for ambulation assistance.  Neurological Examination: Pt has subjective symptoms of neuropathy. Protective sensation intact 5/5 intact bilaterally with 10g monofilament b/l. Vibratory sensation intact b/l. Proprioception intact bilaterally.  Assessment: 1. Pain due to onychomycosis of nail   2. Acquired hammertoes of both feet   3. Diabetic peripheral neuropathy associated with type 2 diabetes mellitus ([place])     Plan: -Examined patient. -Continue diabetic foot care principles. -Advised her to continue using Vaseline Petroleum Jelly on right hallux to keep skin supple and soft until nail grows out. Educated her on acute ingrown toenail and she was instructed to call office if she notices any pain, redness, swelling or drainage. -Toenails 1-5 b/l were debrided in length and girth with sterile nail nippers and dremel without iatrogenic bleeding.  -Patient/POA to call should there be question/concern in the interim. -Patient to continue soft, supportive shoe gear daily. -Patient to report any pedal injuries to medical professional immediately.  Return in about 3 months (around 04/12/2020).  [name], DPM

## 2020-01-13 ENCOUNTER — Other Ambulatory Visit: Payer: Self-pay | Admitting: Family Medicine

## 2020-01-13 DIAGNOSIS — I10 Essential (primary) hypertension: Secondary | ICD-10-CM

## 2020-01-13 DIAGNOSIS — M1A00X0 Idiopathic chronic gout, unspecified site, without tophus (tophi): Secondary | ICD-10-CM

## 2020-01-13 MED ORDER — CARVEDILOL 25 MG PO TABS: 25.0000 mg | ORAL_TABLET | Freq: Two times a day (BID) | ORAL | 0 refills | Status: DC

## 2020-01-13 NOTE — Telephone Encounter (Signed)
Medication Refill - Medication: carvedilol (COREG) 25 MG tablet (Pharmacy called and stated that a new prescription needs to be sent over .)   Has the patient contacted their pharmacy?yes (Agent: If no, request that the patient contact the pharmacy for the refill.) (Agent: If yes, when and what did the pharmacy advise?)Contact pcp  Preferred Pharmacy (with phone number or street name):  [id number] - [place], [place] - [address] Phone:  [phone]  Fax:  [phone]       Agent: Please be advised that RX refills may take up to 3 business days. We ask that you follow-up with your pharmacy.

## 2020-01-13 NOTE — Telephone Encounter (Signed)
Approved per protocol. Dose change to take one tablet two times/day with a meal.  Requested Prescriptions  Pending Prescriptions Disp Refills   carvedilol (COREG) 25 MG tablet 90 tablet     Sig: Take 1 tablet (25 mg total) by mouth 2 (two) times daily with a meal.     Cardiovascular:  Beta Blockers Failed - 01/13/2020  3:46 PM      Failed - Last BP in normal range    BP Readings from Last 1 Encounters:  11/11/19 (!) 155/73         Passed - Last Heart Rate in normal range    Pulse Readings from Last 1 Encounters:  11/11/19 83         Passed - Valid encounter within last 6 months    Recent Outpatient Visits          2 months ago Type 2 diabetes mellitus with other specified complication, with long-term current use of insulin (HCC)   [facility] [place], [place], MD   5 months ago Type 2 diabetes mellitus with other specified complication, with long-term current use of insulin (HCC)   [facility] [place], [place], MD   6 months ago Type 2 diabetes mellitus with other specified complication, with long-term current use of insulin (HCC)   [facility] [place], Odette Horns, MD   6 months ago Chronic pain of both feet   [facility] [place], [place], MD   10 months ago Type 2 diabetes mellitus with other specified complication, with long-term current use of insulin (HCC)   [facility] Hoy Register, MD      Future Appointments            In 4 weeks Hoy Register, MD [facility]

## 2020-02-11 ENCOUNTER — Ambulatory Visit: Payer: Medicare Other | Attending: Family Medicine | Admitting: Family Medicine

## 2020-02-11 ENCOUNTER — Encounter: Payer: Self-pay | Admitting: Family Medicine

## 2020-02-11 ENCOUNTER — Other Ambulatory Visit: Payer: Self-pay

## 2020-02-11 VITALS — BP 158/73 | HR 83 | Ht 64.0 in | Wt 207.0 lb

## 2020-02-11 DIAGNOSIS — E785 Hyperlipidemia, unspecified: Secondary | ICD-10-CM | POA: Insufficient documentation

## 2020-02-11 DIAGNOSIS — Z7901 Long term (current) use of anticoagulants: Secondary | ICD-10-CM | POA: Diagnosis not present

## 2020-02-11 DIAGNOSIS — I129 Hypertensive chronic kidney disease with stage 1 through stage 4 chronic kidney disease, or unspecified chronic kidney disease: Secondary | ICD-10-CM | POA: Insufficient documentation

## 2020-02-11 DIAGNOSIS — E1149 Type 2 diabetes mellitus with other diabetic neurological complication: Secondary | ICD-10-CM | POA: Insufficient documentation

## 2020-02-11 DIAGNOSIS — E1140 Type 2 diabetes mellitus with diabetic neuropathy, unspecified: Secondary | ICD-10-CM | POA: Diagnosis not present

## 2020-02-11 DIAGNOSIS — E1169 Type 2 diabetes mellitus with other specified complication: Secondary | ICD-10-CM | POA: Insufficient documentation

## 2020-02-11 DIAGNOSIS — Z79899 Other long term (current) drug therapy: Secondary | ICD-10-CM | POA: Diagnosis not present

## 2020-02-11 DIAGNOSIS — Z7182 Exercise counseling: Secondary | ICD-10-CM | POA: Insufficient documentation

## 2020-02-11 DIAGNOSIS — Z794 Long term (current) use of insulin: Secondary | ICD-10-CM | POA: Diagnosis not present

## 2020-02-11 DIAGNOSIS — I152 Hypertension secondary to endocrine disorders: Secondary | ICD-10-CM

## 2020-02-11 DIAGNOSIS — E1122 Type 2 diabetes mellitus with diabetic chronic kidney disease: Secondary | ICD-10-CM | POA: Diagnosis not present

## 2020-02-11 DIAGNOSIS — E1159 Type 2 diabetes mellitus with other circulatory complications: Secondary | ICD-10-CM | POA: Diagnosis not present

## 2020-02-11 DIAGNOSIS — M1A00X0 Idiopathic chronic gout, unspecified site, without tophus (tophi): Secondary | ICD-10-CM | POA: Insufficient documentation

## 2020-02-11 DIAGNOSIS — N1832 Chronic kidney disease, stage 3b: Secondary | ICD-10-CM | POA: Diagnosis not present

## 2020-02-11 LAB — POCT GLYCOSYLATED HEMOGLOBIN (HGB A1C): HbA1c, POC (controlled diabetic range): 7.2 % — AB (ref 0.0–7.0)

## 2020-02-11 LAB — GLUCOSE, POCT (MANUAL RESULT ENTRY): POC Glucose: 164 mg/dl — AB (ref 70–99)

## 2020-02-11 MED ORDER — SITAGLIPTIN PHOSPHATE 50 MG PO TABS: ORAL_TABLET | ORAL | 0 refills | Status: DC

## 2020-02-11 MED ORDER — HYDRALAZINE HCL 25 MG PO TABS: 25.0000 mg | ORAL_TABLET | Freq: Two times a day (BID) | ORAL | 1 refills | Status: DC

## 2020-02-11 MED ORDER — EZETIMIBE 10 MG PO TABS: 10.0000 mg | ORAL_TABLET | Freq: Every day | ORAL | 1 refills | Status: DC

## 2020-02-11 MED ORDER — ATORVASTATIN CALCIUM 20 MG PO TABS: 20.0000 mg | ORAL_TABLET | Freq: Every day | ORAL | 1 refills | Status: DC

## 2020-02-11 MED ORDER — AMLODIPINE BESYLATE 10 MG PO TABS: ORAL_TABLET | ORAL | 1 refills | Status: DC

## 2020-02-11 NOTE — Patient Instructions (Signed)

## 2020-02-11 NOTE — Progress Notes (Signed)
Subjective:  Patient ID: Mariah Mason, female    DOB: 31-Oct-1950  Age: 69 y.o. MRN: [id number]  CC: Diabetes   HPI Mariah Mason is a 69 year old female with a history of hypertension, type 2 diabetes mellitus (A1c 7.2), Stage 3 CKD who presents today for follow-upvisit accompanied by her daughter She has Neuropathy, with pain in her feet; Gabapentin prescribed by podiatry made her dizzy and changed her mood and I placed her on Cymbalta which she stated caused "pulling in her legs".  She has not been taking her Pravastatin as she was of the opinion it was initially responsible for the 'pulling in her legs' but she later narrowed it down to Cymbalta by elimination. Compliant with her diabetic regimen and denies hypoglycemia or visual abnormalities. Her BP is elevated despite her compliance with her medications and Coreg had been increased at her last visit but her BP has not improved. Her Gout has been stable with no flares.   Past Medical History:  Diagnosis Date  . Diabetes mellitus without complication ([place])   . Gout   . Hyperlipemia   . Hypertension   . Neuropathy     No past surgical history on file.  No family history on file.  Allergies  Allergen Reactions  . Erythromycin Other (See Comments)    Outpatient Medications Prior to Visit  Medication Sig Dispense Refill  . allopurinol (ZYLOPRIM) 100 MG tablet TAKE 1 TABLET BY MOUTH EVERY DAY 90 tablet 0  . amLODipine (NORVASC) 10 MG tablet TAKE 1 TABLET(10 MG) BY MOUTH DAILY 90 tablet 1  . blood glucose meter kit and supplies KIT Dispense based on patient and insurance preference. Use up to four times daily as directed. (FOR ICD-9 250.00, 250.01). 1 each 0  . carvedilol (COREG) 25 MG tablet Take 1 tablet (25 mg total) by mouth 2 (two) times daily with a meal. 180 tablet 0  . cholecalciferol (VITAMIN D3) 25 MCG (1000 UT) tablet Take 1,000 Units by mouth daily.    . colchicine 0.6 MG tablet Take 2 tablets (1.2 mg) at the  onset of a gout attack, may repeat 1 tablet (0.6 mg) in 2 hours if symptoms persist 30 tablet 1  . cycloSPORINE (RESTASIS) 0.05 % ophthalmic emulsion Place 1 drop into both eyes 2 (two) times daily.     . DULoxetine (CYMBALTA) 60 MG capsule Take 1 capsule (60 mg total) by mouth daily. 30 capsule 3  . Misc. Devices [place] Shower chair. Dx- Diabetes mellitus 1 each 0  . Multiple Vitamins-Minerals (CENTRUM SILVER ADULT 50+ PO) Take 1 tablet by mouth daily.    . NONFORMULARY OR COMPOUNDED ITEM Peripheral Neuropathy Cream: Bupivacaine 1%, Doxepin 3%, Gabapentin 6%, Pentoxifylline 3%, Topiramate 1% Order faxed to [place] 1 each 3  . sitaGLIPtin (JANUVIA) 50 MG tablet TAKE 1 TABLET(50 MG) BY MOUTH DAILY 90 tablet 0  . traMADol (ULTRAM) 50 MG tablet Take 50 mg by mouth at bedtime as needed.    . ezetimibe (ZETIA) 10 MG tablet Take 1 tablet (10 mg total) by mouth daily. (Patient not taking: Reported on 02/11/2020) 90 tablet 3  . fluticasone (FLONASE) 50 MCG/ACT nasal spray Place 1 spray into both nostrils daily as needed for allergies. (Patient not taking: Reported on 02/11/2020)    . pravastatin (PRAVACHOL) 40 MG tablet TAKE 1 TABLET(40 MG) BY MOUTH DAILY (Patient not taking: Reported on 02/11/2020) 90 tablet 0   No facility-administered medications prior to visit.     ROS Review of  Systems  Constitutional: Negative for activity change, appetite change and fatigue.  HENT: Negative for congestion, sinus pressure and sore throat.   Eyes: Negative for visual disturbance.  Respiratory: Negative for cough, chest tightness, shortness of breath and wheezing.   Cardiovascular: Negative for chest pain and palpitations.  Gastrointestinal: Negative for abdominal distention, abdominal pain and constipation.  Endocrine: Negative for polydipsia.  Genitourinary: Negative for dysuria and frequency.  Musculoskeletal: Negative for arthralgias and back pain.  Skin: Negative for rash.  Neurological:  Negative for tremors, light-headedness and numbness.  Hematological: Does not bruise/bleed easily.  Psychiatric/Behavioral: Negative for agitation and behavioral problems.    Objective:  BP (!) 158/73   Pulse 83   Ht _0  (1.626 m)   Wt 207 lb (93.9 kg)   SpO2 100%   BMI 35.53 kg/m   BP/Weight 02/11/2020 11/11/2019 1/91/6606  Systolic BP 004 599 774  Diastolic BP 73 73 88  Wt. (Lbs) 207 205 205  BMI 35.53 35.19 35.19      Physical Exam Constitutional:      General: She is not in acute distress.    Appearance: She is well-developed. She is not diaphoretic.  HENT:     Head: Normocephalic.     Right Ear: External ear normal.     Left Ear: External ear normal.     Nose: Nose normal.  Eyes:     Conjunctiva/sclera: Conjunctivae normal.     Pupils: Pupils are equal, round, and reactive to light.  Neck:     Vascular: No JVD.  Cardiovascular:     Rate and Rhythm: Normal rate and regular rhythm.     Heart sounds: Normal heart sounds. No murmur heard.  No gallop.   Pulmonary:     Effort: Pulmonary effort is normal. No respiratory distress.     Breath sounds: Normal breath sounds. No wheezing or rales.  Chest:     Chest wall: No tenderness.  Abdominal:     General: Bowel sounds are normal. There is no distension.     Palpations: Abdomen is soft. There is no mass.     Tenderness: There is no abdominal tenderness.  Musculoskeletal:        General: No tenderness. Normal range of motion.     Cervical back: Normal range of motion.  Skin:    General: Skin is warm and dry.  Neurological:     Mental Status: She is alert and oriented to person, place, and time.     Deep Tendon Reflexes: Reflexes are normal and symmetric.     CMP Latest Ref Rng & Units 11/11/2019 09/20/2019 06/30/2019  Glucose 65 - 99 mg/dL 126(H) 175(H) 195(H)  BUN 8 - 27 mg/dL 35(H) 35(H) 33(H)  Creatinine 0.57 - 1.00 mg/dL 1.50(H) 1.62(H) 1.44(H)  Sodium 134 - 144 mmol/L 143 139 141  Potassium 3.5 - 5.2  mmol/L 5.0 4.6 4.4  Chloride 96 - 106 mmol/L 105 105 106  CO2 20 - 29 mmol/L _1 Calcium 8.7 - 10.3 mg/dL 10.1 9.2 10.0  Total Protein 6.0 - 8.5 g/dL 7.8 - 7.6  Total Bilirubin 0.0 - 1.2 mg/dL 0.3 - 0.2  Alkaline Phos 48 - 121 IU/L 84 - 95  AST 0 - 40 IU/L 16 - 12  ALT 0 - 32 IU/L 15 - 13    Lipid Panel     Component Value Date/Time   CHOL 166 12/24/2019 1159   TRIG 188 (H) 12/24/2019 1159   HDL 32 (  L) 12/24/2019 1159   CHOLHDL 5.2 (H) 12/24/2019 1159   LDLCALC 101 (H) 12/24/2019 1159    CBC    Component Value Date/Time   WBC 7.5 09/20/2019 1938   RBC 4.33 09/20/2019 1938   HGB 11.9 (L) 09/20/2019 1938   HCT 36.1 09/20/2019 1938   PLT 232 09/20/2019 1938   MCV 83.4 09/20/2019 1938   MCH 27.5 09/20/2019 1938   MCHC 33.0 09/20/2019 1938   RDW 12.8 09/20/2019 1938   LYMPHSABS 1.7 09/20/2019 1938   MONOABS 0.5 09/20/2019 1938   EOSABS 0.2 09/20/2019 1938   BASOSABS 0.0 09/20/2019 1938    Lab Results  Component Value Date   HGBA1C 7.2 (A) 02/11/2020    Assessment & Plan:  1. Type 2 diabetes mellitus with other neurologic complication, with long-term current use of insulin (HCC) Controlled with A1c of 7.2 Continue current regimen Counseled on Diabetic diet, my plate method, 532 minutes of moderate intensity exercise/week Blood sugar logs with fasting goals of 80-120 mg/dl, random of less than 180 and in the event of sugars less than 60 mg/dl or greater than 400 mg/dl encouraged to notify the clinic. Advised on the need for annual eye exams, annual foot exams, Pneumonia vaccine. - POCT glucose (manual entry) - POCT glycosylated hemoglobin (Hb A1C) - sitaGLIPtin (JANUVIA) 50 MG tablet; TAKE 1 TABLET(50 MG) BY MOUTH DAILY  Dispense: 90 tablet; Refill: 0 - CMP14+EGFR  2. Hypertension complicating diabetes ([place]) Uncontrolled Would love to add an ACEI however her potassium is 5.0 and she is at risk of hyperkalemia with this Hydralazine added - discussed side  effect profile - amLODipine (NORVASC) 10 MG tablet; TAKE 1 TABLET(10 MG) BY MOUTH DAILY  Dispense: 90 tablet; Refill: 1  3. Idiopathic chronic gout without tophus, unspecified site Stable Continue current regimen  4. Type 2 diabetes mellitus with stage 3b chronic kidney disease, without long-term current use of insulin (HCC) Stable Combination of Hypertensive and Diabetic Nephropathy Avoid Nephrotoxins  5. Hyperlipidemia associated with type 2 diabetes mellitus ([place]) LDL is slightly above goal She would like a lower dose of her Cholesterol  pill and is on Pravastatin which I have substituted with Atorvastatin.   Return for Chronic Disease management.       Charlott Rakes, MD, FAAFP. [facility] [place], [place]   02/11/2020, 11:25 AM

## 2020-02-11 NOTE — Progress Notes (Signed)
Having numbness and tingling in feet.

## 2020-02-12 ENCOUNTER — Telehealth: Payer: Self-pay

## 2020-02-12 LAB — CMP14+EGFR
ALT: 11 IU/L (ref 0–32)
AST: 14 IU/L (ref 0–40)
Albumin/Globulin Ratio: 1.7 (ref 1.2–2.2)
Albumin: 4.6 g/dL (ref 3.8–4.8)
Alkaline Phosphatase: 74 IU/L (ref 44–121)
BUN/Creatinine Ratio: 24 (ref 12–28)
BUN: 31 mg/dL — ABNORMAL HIGH (ref 8–27)
Bilirubin Total: 0.3 mg/dL (ref 0.0–1.2)
CO2: 24 mmol/L (ref 20–29)
Calcium: 10.2 mg/dL (ref 8.7–10.3)
Chloride: 106 mmol/L (ref 96–106)
Creatinine, Ser: 1.3 mg/dL — ABNORMAL HIGH (ref 0.57–1.00)
GFR calc Af Amer: 48 mL/min/{1.73_m2} — ABNORMAL LOW (ref >59)
GFR calc non Af Amer: 42 mL/min/{1.73_m2} — ABNORMAL LOW (ref >59)
Globulin, Total: 2.7 g/dL (ref 1.5–4.5)
Glucose: 131 mg/dL — ABNORMAL HIGH (ref 65–99)
Potassium: 4.5 mmol/L (ref 3.5–5.2)
Sodium: 141 mmol/L (ref 134–144)
Total Protein: 7.3 g/dL (ref 6.0–8.5)

## 2020-02-12 NOTE — Telephone Encounter (Signed)
-----   Message from Hoy Register, MD sent at 02/12/2020  7:38 AM EDT ----- Labs are stable. Potassium is normal

## 2020-02-12 NOTE — Telephone Encounter (Signed)
Patient name and DOB has been verified Patient was informed of lab results. Patient had no questions.  

## 2020-03-25 ENCOUNTER — Other Ambulatory Visit: Payer: Self-pay | Admitting: Family Medicine

## 2020-03-25 DIAGNOSIS — I10 Essential (primary) hypertension: Secondary | ICD-10-CM

## 2020-03-30 ENCOUNTER — Other Ambulatory Visit: Payer: Self-pay | Admitting: Family Medicine

## 2020-03-30 DIAGNOSIS — M1A00X0 Idiopathic chronic gout, unspecified site, without tophus (tophi): Secondary | ICD-10-CM

## 2020-04-03 ENCOUNTER — Emergency Department (HOSPITAL_BASED_OUTPATIENT_CLINIC_OR_DEPARTMENT_OTHER)
Admission: EM | Admit: 2020-04-03 | Discharge: 2020-04-03 | Disposition: A | Discharge status: Home / Self Care | Payer: Medicare Other | Attending: Emergency Medicine | Admitting: Emergency Medicine

## 2020-04-03 ENCOUNTER — Other Ambulatory Visit: Payer: Self-pay

## 2020-04-03 ENCOUNTER — Encounter (HOSPITAL_BASED_OUTPATIENT_CLINIC_OR_DEPARTMENT_OTHER): Payer: Self-pay | Admitting: Emergency Medicine

## 2020-04-03 DIAGNOSIS — L0211 Cutaneous abscess of neck: Secondary | ICD-10-CM | POA: Insufficient documentation

## 2020-04-03 DIAGNOSIS — Z7984 Long term (current) use of oral hypoglycemic drugs: Secondary | ICD-10-CM | POA: Diagnosis not present

## 2020-04-03 DIAGNOSIS — L0291 Cutaneous abscess, unspecified: Secondary | ICD-10-CM

## 2020-04-03 DIAGNOSIS — R221 Localized swelling, mass and lump, neck: Secondary | ICD-10-CM | POA: Diagnosis present

## 2020-04-03 DIAGNOSIS — E1140 Type 2 diabetes mellitus with diabetic neuropathy, unspecified: Secondary | ICD-10-CM | POA: Insufficient documentation

## 2020-04-03 DIAGNOSIS — Z79899 Other long term (current) drug therapy: Secondary | ICD-10-CM | POA: Diagnosis not present

## 2020-04-03 DIAGNOSIS — I10 Essential (primary) hypertension: Secondary | ICD-10-CM | POA: Insufficient documentation

## 2020-04-03 DIAGNOSIS — E11649 Type 2 diabetes mellitus with hypoglycemia without coma: Secondary | ICD-10-CM | POA: Diagnosis not present

## 2020-04-03 MED ORDER — DOXYCYCLINE HYCLATE 100 MG PO CAPS: 100.0000 mg | ORAL_CAPSULE | Freq: Two times a day (BID) | ORAL | 0 refills | Status: DC

## 2020-04-03 MED ORDER — LIDOCAINE HCL (PF) 1 % IJ SOLN
5.0000 mL | Freq: Once | INTRAMUSCULAR | Status: AC
  Administered 2020-04-03: 5 mL
  Filled 2020-04-03: qty 5

## 2020-04-03 NOTE — ED Triage Notes (Signed)
Pt arrives pov with driver with c/o abscess on left side of neck "for months". Pt c/o tenderness and drainage since yesterday. Pt endorses having checked by PCP but drainage is new. Pt denies difficulty swallowing. V/s WNL

## 2020-04-03 NOTE — ED Provider Notes (Signed)
[facility] EMERGENCY DEPARTMENT Provider Note   CSN: [id number] Arrival date & time: 04/03/20  1230     History Chief Complaint  Patient presents with  . Abscess    Mariah Mason is a 69 y.o. female.  69 year old female with past medical history of diabetes presents with complaint of an abscess on her neck with drainage.  Patient states that she first noticed a small knot on the side of her neck several months ago, was seen by her PCP who told her to leave the area alone.  Patient states the area has become larger recently although unable to say when and is now draining.  Denies fevers or any other complaints or concerns.        Past Medical History:  Diagnosis Date  . Diabetes mellitus without complication ([place])   . Gout   . Hyperlipemia   . Hypertension   . Neuropathy     Patient Active Problem List   Diagnosis Date Noted  . Gout   . Hypertension   . Type 2 diabetes mellitus ([place]) 02/18/2019  . AKI (acute kidney injury) ([place]) 02/18/2019  . Hyperkalemia 02/18/2019  . Metabolic acidosis 17/49/4496  . Hypoglycemia 02/17/2019  . Long term current use of oral hypoglycemic drug 05/23/2017  . Presbyopia of both eyes 05/23/2017  . Vitreous floaters of both eyes 05/23/2017  . Keratoconjunctivitis sicca of both eyes not specified as Sjogren's 10/16/2016  . Nuclear sclerotic cataract of both eyes 10/16/2016    History reviewed. No pertinent surgical history.   OB History   No obstetric history on file.     History reviewed. No pertinent family history.  Social History   Tobacco Use  . Smoking status: Never Smoker  . Smokeless tobacco: Never Used  Vaping Use  . Vaping Use: Never used  Substance Use Topics  . Alcohol use: Never  . Drug use: Never    Home Medications Prior to Admission medications   Medication Sig Start Date End Date Taking? Authorizing Provider  allopurinol (ZYLOPRIM) 100 MG tablet TAKE 1 TABLET BY MOUTH EVERY DAY 03/30/20    Charlott Rakes, MD  amLODipine (NORVASC) 10 MG tablet TAKE 1 TABLET(10 MG) BY MOUTH DAILY 02/11/20   Charlott Rakes, MD  atorvastatin (LIPITOR) 20 MG tablet Take 1 tablet (20 mg total) by mouth daily. 02/11/20   Charlott Rakes, MD  blood glucose meter kit and supplies KIT Dispense based on patient and insurance preference. Use up to four times daily as directed. (FOR ICD-9 250.00, 250.01). 02/20/19   Donne Hazel, MD  carvedilol (COREG) 25 MG tablet TAKE 1 TABLET(25 MG) BY MOUTH TWICE DAILY WITH A MEAL 03/25/20   Charlott Rakes, MD  cholecalciferol (VITAMIN D3) 25 MCG (1000 UT) tablet Take 1,000 Units by mouth daily.    [provider]  colchicine 0.6 MG tablet TAKE 2 TABLETS(1.2 MG) BY MOUTH AT ONSET FOR GOUT. MAY REPEAT 1 TABLET 0.6 MG IN 2 HOURS IF SYMPTOMS PERSIST 03/30/20   Charlott Rakes, MD  cycloSPORINE (RESTASIS) 0.05 % ophthalmic emulsion Place 1 drop into both eyes 2 (two) times daily.  12/05/18   [provider]  doxycycline (VIBRAMYCIN) 100 MG capsule Take 1 capsule (100 mg total) by mouth 2 (two) times daily. 04/03/20   Tacy Learn, PA-C  DULoxetine (CYMBALTA) 60 MG capsule Take 1 capsule (60 mg total) by mouth daily. 11/11/19   Charlott Rakes, MD  ezetimibe (ZETIA) 10 MG tablet Take 1 tablet (10 mg total) by  mouth daily. 02/11/20   Charlott Rakes, MD  fluticasone (FLONASE) 50 MCG/ACT nasal spray Place 1 spray into both nostrils daily as needed for allergies. Patient not taking: Reported on 02/11/2020 02/27/16   [provider]  hydrALAZINE (APRESOLINE) 25 MG tablet Take 1 tablet (25 mg total) by mouth in the morning and at bedtime. 02/11/20   Charlott Rakes, MD  Misc. Devices [place] [facility] Shower chair. Dx- Diabetes mellitus 08/12/19   Charlott Rakes, MD  Multiple Vitamins-Minerals (CENTRUM SILVER ADULT 50+ PO) Take 1 tablet by mouth daily.    [provider]  NONFORMULARY OR COMPOUNDED ITEM Peripheral Neuropathy Cream: Bupivacaine 1%, Doxepin 3%,  Gabapentin 6%, Pentoxifylline 3%, Topiramate 1% Order fax[facility] to [facility] 10/05/19   Galaway, Stephani Police, DPM  sitaGLIPtin (JANUVIA) 50 MG tablet TAKE 1 TABLET(50 MG) BY MOUTH DAILY 02/11/20   Charlott Rakes, MD  traMADol (ULTRAM) 50 MG tablet Take 50 mg by mouth at bedtime as needed. 06/02/19   [provider]    Allergies    Erythromycin  Review of Systems   Review of Systems  Constitutional: Negative for fever.  Musculoskeletal: Negative for neck pain and neck stiffness.  Skin: Negative for rash and wound.  Allergic/Immunologic: Positive for immunocompromised state.  Neurological: Negative for weakness and headaches.  Hematological: Negative for adenopathy.    Physical Exam Updated Vital Signs BP (!) 157/77 (BP Location: Right Arm)   Pulse 81   Temp 98.7 F (37.1 C) (Oral)   Resp 16   Ht 5' 4"  (1.626 m)   Wt 92.5 kg   SpO2 99%   BMI 35.02 kg/m   Physical Exam Vitals and nursing note reviewed.  Constitutional:      General: She is not in acute distress.    Appearance: She is well-developed. She is not diaphoretic.  HENT:     Head: Normocephalic and atraumatic.  Pulmonary:     Effort: Pulmonary effort is normal.  Musculoskeletal:        General: No swelling or tenderness.  Lymphadenopathy:     Cervical: No cervical adenopathy.  Skin:    General: Skin is warm and dry.       Neurological:     Mental Status: She is alert and oriented to person, place, and time.  Psychiatric:        Behavior: Behavior normal.     ED Results / Procedures / Treatments   Labs (all labs ordered are listed, but only abnormal results are displayed) Labs Reviewed - No data to display  EKG None  Radiology No results found.  Procedures .Marland KitchenIncision and Drainage  Date/Time: 04/03/2020 4:11 PM Performed by: Tacy Learn, PA-C Authorized by: Tacy Learn, PA-C   Consent:    Consent obtained:  Verbal   Consent given by:  Patient   Risks discussed:   Bleeding, incomplete drainage, pain and damage to other organs   Alternatives discussed:  No treatment Universal protocol:    Procedure explained and questions answered to patient or proxy's satisfaction: yes     Relevant documents present and verified: yes     Test results available and properly labeled: yes     Imaging studies available: yes     Required blood products, implants, devices, and special equipment available: yes     Site/side marked: yes     Immediately prior to procedure a time out was called: yes     Patient identity confirmed:  Verbally with patient Location:    Type:  Abscess   Size:  3cm x 3cm    Location:  Neck   Neck location:  L posterior Pre-procedure details:    Skin preparation:  Betadine Anesthesia (see MAR for exact dosages):    Anesthesia method:  Local infiltration   Local anesthetic:  Lidocaine 1% w/o epi Procedure type:    Complexity:  Complex Procedure details:    Incision types:  Single straight   Incision depth:  Subcutaneous   Scalpel blade:  11   Wound management:  Probed and deloculated, irrigated with saline and extensive cleaning   Drainage:  Purulent   Drainage amount:  Moderate   Packing materials:  1/4 in gauze   Amount 1/4":  2" Post-procedure details:    Patient tolerance of procedure:  Tolerated well, no immediate complications   (including critical care time)  Medications Ordered in ED Medications  lidocaine (PF) (XYLOCAINE) 1 % injection 5 mL (5 mLs Infiltration Given by Other 04/03/20 1527)    ED Course  I have reviewed the triage vital signs and the nursing notes.  Pertinent labs & imaging results that were available during my care of the patient were reviewed by me and considered in my medical decision making (see chart for details).  Clinical Course as of Apr 04 1611  Nancy Fetter Apr 04, 6535  1267 69 year old female with abscess to left posterior neck.  Plan is to I&D area and discharged home on doxycycline.  Recommend  recheck with PCP in 2 days for packing removal.   [LM]  1611 I&D of infected sebaceous cyst to left posterior neck with packing placed.  Recommend recheck with PCP in 2 days for packing removal, discussed with PCP follow-up with general surgery for removal of sebaceous cyst if recurrent.   [LM]    Clinical Course User Index [LM] Roque Lias   MDM Rules/Calculators/A&P                          Final Clinical Impression(s) / ED Diagnoses Final diagnoses:  Abscess    Rx / DC Orders ED Discharge Orders         Ordered    doxycycline (VIBRAMYCIN) 100 MG capsule  2 times daily        04/03/20 1520           Roque Lias 04/03/20 1612    Varney Biles, MD 04/04/20 1511

## 2020-04-03 NOTE — Discharge Instructions (Addendum)
Take doxycycline as prescribed and complete the full course. Apply warm compresses to the area for 20 minutes at a time. Recheck with your doctor on Tuesday for wound check and packing removal. Talk to your doctor about seeing general surgery for removal of area. This is a simple in office procedure.

## 2020-04-05 ENCOUNTER — Encounter (HOSPITAL_BASED_OUTPATIENT_CLINIC_OR_DEPARTMENT_OTHER): Payer: Self-pay

## 2020-04-05 ENCOUNTER — Emergency Department (HOSPITAL_BASED_OUTPATIENT_CLINIC_OR_DEPARTMENT_OTHER)
Admission: EM | Admit: 2020-04-05 | Discharge: 2020-04-05 | Disposition: A | Discharge status: Home / Self Care | Payer: Medicare Other | Attending: Emergency Medicine | Admitting: Emergency Medicine

## 2020-04-05 ENCOUNTER — Other Ambulatory Visit: Payer: Self-pay

## 2020-04-05 DIAGNOSIS — L0211 Cutaneous abscess of neck: Secondary | ICD-10-CM | POA: Diagnosis present

## 2020-04-05 DIAGNOSIS — I10 Essential (primary) hypertension: Secondary | ICD-10-CM | POA: Diagnosis not present

## 2020-04-05 DIAGNOSIS — Z79899 Other long term (current) drug therapy: Secondary | ICD-10-CM | POA: Diagnosis not present

## 2020-04-05 DIAGNOSIS — Z5189 Encounter for other specified aftercare: Secondary | ICD-10-CM

## 2020-04-05 DIAGNOSIS — Z48817 Encounter for surgical aftercare following surgery on the skin and subcutaneous tissue: Secondary | ICD-10-CM | POA: Diagnosis not present

## 2020-04-05 DIAGNOSIS — E119 Type 2 diabetes mellitus without complications: Secondary | ICD-10-CM | POA: Diagnosis not present

## 2020-04-05 NOTE — ED Notes (Signed)
Review D/C papers with pt, pt states understanding, pt denies questions at this time. 

## 2020-04-05 NOTE — ED Triage Notes (Signed)
Pt complaint of packing placed in L side of neck Tuesday, pt complaint of following up.

## 2020-04-05 NOTE — ED Provider Notes (Signed)
[facility] EMERGENCY DEPARTMENT Provider Note   CSN: [id number] Arrival date & time: 04/05/20  1031     History No chief complaint on file.   Mariah Mason is a 69 y.o. female.  Patient is a 69 year old female with a history of diabetes, hypertension hyperlipidemia who presents with an abscess recheck.  She had an abscess in her left neck that appeared to be an infected sebaceous cyst and had an I&D here in the emergency department 2 days ago.  She had packing placed and was advised to follow-up with her PCP in 2 days to have the packing removed.  She presents here today for recheck and packing removal.  She overall says that the infection seems to be doing better.  She is compliant with her doxycycline.  She says that she feels like the swelling has improved.  She denies any fevers or other worsening symptoms.        Past Medical History:  Diagnosis Date  . Diabetes mellitus without complication ([place])   . Gout   . Hyperlipemia   . Hypertension   . Neuropathy     Patient Active Problem List   Diagnosis Date Noted  . Gout   . Hypertension   . Type 2 diabetes mellitus ([place]) 02/18/2019  . AKI (acute kidney injury) ([place]) 02/18/2019  . Hyperkalemia 02/18/2019  . Metabolic acidosis 47/12/6281  . Hypoglycemia 02/17/2019  . Long term current use of oral hypoglycemic drug 05/23/2017  . Presbyopia of both eyes 05/23/2017  . Vitreous floaters of both eyes 05/23/2017  . Keratoconjunctivitis sicca of both eyes not specified as Sjogren's 10/16/2016  . Nuclear sclerotic cataract of both eyes 10/16/2016    History reviewed. No pertinent surgical history.   OB History   No obstetric history on file.     History reviewed. No pertinent family history.  Social History   Tobacco Use  . Smoking status: Never Smoker  . Smokeless tobacco: Never Used  Vaping Use  . Vaping Use: Never used  Substance Use Topics  . Alcohol use: Never  . Drug use: Never    Home  Medications Prior to Admission medications   Medication Sig Start Date End Date Taking? Authorizing Provider  allopurinol (ZYLOPRIM) 100 MG tablet TAKE 1 TABLET BY MOUTH EVERY DAY 03/30/20   Charlott Rakes, MD  amLODipine (NORVASC) 10 MG tablet TAKE 1 TABLET(10 MG) BY MOUTH DAILY 02/11/20   Charlott Rakes, MD  atorvastatin (LIPITOR) 20 MG tablet Take 1 tablet (20 mg total) by mouth daily. 02/11/20   Charlott Rakes, MD  blood glucose meter kit and supplies KIT Dispense based on patient and insurance preference. Use up to four times daily as directed. (FOR ICD-9 250.00, 250.01). 02/20/19   Donne Hazel, MD  carvedilol (COREG) 25 MG tablet TAKE 1 TABLET(25 MG) BY MOUTH TWICE DAILY WITH A MEAL 03/25/20   Charlott Rakes, MD  cholecalciferol (VITAMIN D3) 25 MCG (1000 UT) tablet Take 1,000 Units by mouth daily.    [provider]  colchicine 0.6 MG tablet TAKE 2 TABLETS(1.2 MG) BY MOUTH AT ONSET FOR GOUT. MAY REPEAT 1 TABLET 0.6 MG IN 2 HOURS IF SYMPTOMS PERSIST 03/30/20   Charlott Rakes, MD  cycloSPORINE (RESTASIS) 0.05 % ophthalmic emulsion Place 1 drop into both eyes 2 (two) times daily.  12/05/18   [provider]  doxycycline (VIBRAMYCIN) 100 MG capsule Take 1 capsule (100 mg total) by mouth 2 (two) times daily. 04/03/20   Tacy Learn, PA-C  DULoxetine (CYMBALTA) 60 MG capsule Take 1 capsule (60 mg total) by mouth daily. 11/11/19   Charlott Rakes, MD  ezetimibe (ZETIA) 10 MG tablet Take 1 tablet (10 mg total) by mouth daily. 02/11/20   Charlott Rakes, MD  fluticasone (FLONASE) 50 MCG/ACT nasal spray Place 1 spray into both nostrils daily as needed for allergies. Patient not taking: Reported on 02/11/2020 02/27/16   [provider]  hydrALAZINE (APRESOLINE) 25 MG tablet Take 1 tablet (25 mg total) by mouth in the morning and at bedtime. 02/11/20   Charlott Rakes, MD  Misc. Devices [place] Shower chair. Dx- Diabetes mellitus 08/12/19   Charlott Rakes, MD  Multiple  Vitamins-Minerals (CENTRUM SILVER ADULT 50+ PO) Take 1 tablet by mouth daily.    [provider]  NONFORMULARY OR COMPOUNDED ITEM Peripheral Neuropathy Cream: Bupivacaine 1%, Doxepin 3%, Gabapentin 6%, Pentoxifylline 3%, Topiramate 1% Order faxed to [facility] 10/05/19   Galaway, Stephani Police, DPM  sitaGLIPtin (JANUVIA) 50 MG tablet TAKE 1 TABLET(50 MG) BY MOUTH DAILY 02/11/20   Charlott Rakes, MD  traMADol (ULTRAM) 50 MG tablet Take 50 mg by mouth at bedtime as needed. 06/02/19   [provider]    Allergies    Erythromycin  Review of Systems   Review of Systems  Constitutional: Negative for fever.  Gastrointestinal: Negative for nausea and vomiting.  Musculoskeletal: Negative for neck stiffness.  Skin: Positive for wound.  Neurological: Negative for headaches.    Physical Exam Updated Vital Signs BP (!) 165/75 (BP Location: Left Arm)   Pulse 83   Temp 98.5 F (36.9 C)   Resp 15   SpO2 99%   Physical Exam Constitutional:      Appearance: Normal appearance.  Cardiovascular:     Rate and Rhythm: Normal rate.  Pulmonary:     Effort: Pulmonary effort is normal.  Skin:    General: Skin is warm and dry.     Comments: Patient has an open draining small abscess to her left neck.  The packing has already been removed.  There is some mild induration around the wound.  No fluctuance.  It is draining a small amount of purulent drainage.  No surrounding cellulitis.  Neurological:     Mental Status: She is alert.  Psychiatric:        Mood and Affect: Mood normal.     ED Results / Procedures / Treatments   Labs (all labs ordered are listed, but only abnormal results are displayed) Labs Reviewed - No data to display  EKG None  Radiology No results found.  Procedures Procedures (including critical care time)  Medications Ordered in ED Medications - No data to display  ED Course  I have reviewed the triage vital signs and the nursing  notes.  Pertinent labs & imaging results that were available during my care of the patient were reviewed by me and considered in my medical decision making (see chart for details).    MDM Rules/Calculators/A&P                          Patient presents for recheck on her abscess.  Appears to be doing well.  The packing has already been removed.  We will continue warm compresses and her antibiotics.  Return precautions were given.  I did discuss with her the fact that this was likely an infected cyst and at some point may need to have the cyst removed.  She will discuss this  with her PCP. Final Clinical Impression(s) / ED Diagnoses Final diagnoses:  Wound check, abscess    Rx / DC Orders ED Discharge Orders    None       Malvin Johns, MD 04/05/20 1215

## 2020-04-12 ENCOUNTER — Other Ambulatory Visit: Payer: Self-pay | Admitting: Family Medicine

## 2020-04-12 ENCOUNTER — Other Ambulatory Visit: Payer: Self-pay

## 2020-04-12 ENCOUNTER — Ambulatory Visit (INDEPENDENT_AMBULATORY_CARE_PROVIDER_SITE_OTHER): Payer: Medicare Other | Admitting: Podiatry

## 2020-04-12 ENCOUNTER — Encounter: Payer: Self-pay | Admitting: Podiatry

## 2020-04-12 DIAGNOSIS — M2041 Other hammer toe(s) (acquired), right foot: Secondary | ICD-10-CM | POA: Diagnosis not present

## 2020-04-12 DIAGNOSIS — M79609 Pain in unspecified limb: Secondary | ICD-10-CM | POA: Diagnosis not present

## 2020-04-12 DIAGNOSIS — B351 Tinea unguium: Secondary | ICD-10-CM | POA: Diagnosis not present

## 2020-04-12 DIAGNOSIS — E1142 Type 2 diabetes mellitus with diabetic polyneuropathy: Secondary | ICD-10-CM

## 2020-04-12 DIAGNOSIS — M1A00X0 Idiopathic chronic gout, unspecified site, without tophus (tophi): Secondary | ICD-10-CM

## 2020-04-12 DIAGNOSIS — M2042 Other hammer toe(s) (acquired), left foot: Secondary | ICD-10-CM

## 2020-04-12 DIAGNOSIS — G5793 Unspecified mononeuropathy of bilateral lower limbs: Secondary | ICD-10-CM

## 2020-04-17 NOTE — Progress Notes (Signed)
Subjective: Mariah Mason presents today for annual diabetic foot examination and painful mycotic nails b/l that are difficult to trim. Pain interferes with ambulation. Aggravating factors include wearing enclosed shoe gear. Pain is relieved with periodic professional debridement.   Patient relates her right great toe is better.  Last checked her blood sugar yesterday and it was 140 mg/dl.   She states her feet are becoming increasingly more painful lately.  Mariah Rakes, MD is patient's PCP. Last visit was: 02/11/2020.  Past Medical History:  Diagnosis Date  . Diabetes mellitus without complication (Mariah Mason)   . Gout   . Hyperlipemia   . Hypertension   . Neuropathy      Current Outpatient Medications on File Prior to Visit  Medication Sig Dispense Refill  . allopurinol (ZYLOPRIM) 100 MG tablet TAKE 1 TABLET BY MOUTH EVERY DAY 90 tablet 0  . amLODipine (NORVASC) 10 MG tablet TAKE 1 TABLET(10 MG) BY MOUTH DAILY 90 tablet 1  . atorvastatin (LIPITOR) 20 MG tablet Take 1 tablet (20 mg total) by mouth daily. 90 tablet 1  . blood glucose meter kit and supplies KIT Dispense based on patient and insurance preference. Use up to four times daily as directed. (FOR ICD-9 250.00, 250.01). 1 each 0  . carvedilol (COREG) 25 MG tablet TAKE 1 TABLET(25 MG) BY MOUTH TWICE DAILY WITH A MEAL 180 tablet 0  . cholecalciferol (VITAMIN D3) 25 MCG (1000 UT) tablet Take 1,000 Units by mouth daily.    . colchicine 0.6 MG tablet TAKE 2 TABLETS(1.2 MG) BY MOUTH AT ONSET FOR GOUT. MAY REPEAT 1 TABLET 0.6 MG IN 2 HOURS IF SYMPTOMS PERSIST 30 tablet 1  . cycloSPORINE (RESTASIS) 0.05 % ophthalmic emulsion Place 1 drop into both eyes 2 (two) times daily.     Marland Kitchen doxycycline (VIBRAMYCIN) 100 MG capsule Take 1 capsule (100 mg total) by mouth 2 (two) times daily. 20 capsule 0  . Misc. Devices [place] Shower chair. Dx- Diabetes mellitus 1 each 0  . Multiple Vitamins-Minerals (CENTRUM SILVER ADULT 50+ PO) Take 1 tablet by mouth  daily.    . NONFORMULARY OR COMPOUNDED ITEM Peripheral Neuropathy Cream: Bupivacaine 1%, Doxepin 3%, Gabapentin 6%, Pentoxifylline 3%, Topiramate 1% Order faxed to [place] 1 each 3  . sitaGLIPtin (JANUVIA) 50 MG tablet TAKE 1 TABLET(50 MG) BY MOUTH DAILY 90 tablet 0  . traMADol (ULTRAM) 50 MG tablet Take 50 mg by mouth at bedtime as needed.     No current facility-administered medications on file prior to visit.     Allergies  Allergen Reactions  . Erythromycin Other (See Comments)    Objective: Mariah Mason is a pleasant 69 y.o.  African American female, obese in NAD. AAO x 3.  There were no vitals filed for this visit.  Vascular Examination: Neurovascular status unchanged b/l lower extremities. Capillary refill time to digits immediate b/l. Palpable DP pulses b/l. Palpable PT pulses b/l. Pedal hair sparse b/l. Skin temperature gradient within normal limits b/l. No pain with calf compression b/l. No edema noted b/l.  Dermatological Examination: Pedal skin with normal turgor, texture and tone bilaterally. No open wounds bilaterally. No interdigital macerations bilaterally. Toenails 1-5 b/l elongated, discolored, dystrophic, thickened, crumbly with subungual debris and tenderness to dorsal palpation.   Musculoskeletal: Normal muscle strength 5/5 to all lower extremity muscle groups bilaterally. No pain crepitus or joint limitation noted with ROM b/l. Hammertoes noted to the 2-5 bilaterally. Wearing appropriate fitting shoe gear. Utilizes cane for ambulation assistance.  Neurological Examination:  Pt has subjective symptoms of neuropathy. Protective sensation intact 5/5 intact bilaterally with 10g monofilament b/l. Vibratory sensation intact b/l. Proprioception intact bilaterally.   Hemoglobin A1C Latest Ref Rng & Units 02/11/2020 11/11/2019 06/30/2019  HGBA1C 0.0 - 7.0 % 7.2(A) 6.9 7.5(A)  Some recent data might be hidden    Assessment: 1. Pain due to onychomycosis of  nail   2. Acquired hammertoes of both feet   3. Diabetic peripheral neuropathy associated with type 2 diabetes mellitus (Mariah Mason)   4. Neuropathic pain of both feet     Plan: -Examined patient. -Continue diabetic foot care principles. -Continue using Vaseline Petroleum Jelly on right hallux to keep skin supple and soft until nail grows out.  -Toenails 1-5 b/l were debrided in length and girth with sterile nail nippers and dremel without iatrogenic bleeding.  -She needs new Neurology referral for painful neuropathic pain. -Patient/POA to call should there be question/concern in the interim. -Patient to continue soft, supportive shoe gear daily. -Patient to report any pedal injuries to medical professional immediately.  Return in about 3 months (around 07/11/2020) for diabetic foot care.  [name], DPM

## 2020-05-10 ENCOUNTER — Ambulatory Visit: Payer: Medicare Other | Admitting: Family Medicine

## 2020-05-10 ENCOUNTER — Other Ambulatory Visit: Payer: Self-pay

## 2020-05-11 ENCOUNTER — Ambulatory Visit: Payer: Medicare Other | Attending: Family Medicine | Admitting: Family Medicine

## 2020-05-11 ENCOUNTER — Encounter: Payer: Self-pay | Admitting: Family Medicine

## 2020-05-11 ENCOUNTER — Other Ambulatory Visit: Payer: Self-pay

## 2020-05-11 DIAGNOSIS — E1149 Type 2 diabetes mellitus with other diabetic neurological complication: Secondary | ICD-10-CM

## 2020-05-11 DIAGNOSIS — I10 Essential (primary) hypertension: Secondary | ICD-10-CM | POA: Diagnosis not present

## 2020-05-11 DIAGNOSIS — Z794 Long term (current) use of insulin: Secondary | ICD-10-CM

## 2020-05-11 DIAGNOSIS — R221 Localized swelling, mass and lump, neck: Secondary | ICD-10-CM | POA: Diagnosis not present

## 2020-05-11 DIAGNOSIS — M1A00X0 Idiopathic chronic gout, unspecified site, without tophus (tophi): Secondary | ICD-10-CM

## 2020-05-11 MED ORDER — SITAGLIPTIN PHOSPHATE 50 MG PO TABS: ORAL_TABLET | ORAL | 1 refills | Status: DC

## 2020-05-11 MED ORDER — ALLOPURINOL 100 MG PO TABS: 100.0000 mg | ORAL_TABLET | Freq: Every day | ORAL | 1 refills | Status: DC

## 2020-05-11 MED ORDER — CARVEDILOL 25 MG PO TABS: ORAL_TABLET | ORAL | 1 refills | Status: DC

## 2020-05-11 NOTE — Progress Notes (Signed)
Needs referral to general surgery for boil on neck.

## 2020-05-11 NOTE — Progress Notes (Signed)
Virtual Visit via Telephone Note  I connected with Mariah Mason, on 05/11/2020 at 10:44 AM by telephone due to the COVID-19 pandemic and verified that I am speaking with the correct person using two identifiers.   Consent: I discussed the limitations, risks, security and privacy concerns of performing an evaluation and management service by telephone and the availability of in person appointments. I also discussed with the patient that there may be a patient responsible charge related to this service. The patient expressed understanding and agreed to proceed.   Location of Patient: Home  Location of Provider: Home   Persons participating in Telemedicine visit: [place] Daughter Cydney Ok) Dr. Margarita Rana     History of Present Illness: Mariah Mason is a 70 year old female with a history of hypertension, type 2 diabetes mellitus (A1c7.2), Stage 3 CKD, Gout who presents today for follow-upvisit. She would like to be referred to General Surgery for excision of a boil for which she previously underwent incision and drainage but states it reoccurred. Boil is on the left side of her neck.  BP at home have been 130/70s and she has been compliant with her antihypertensive.  Still on her Atorvastatin even though she sometimes has 'pulling in her legs'. She has no hypoglycemia and states fasting sugars have been normal; compliant with her Diabetic medications. Her Gout has been stable.  Past Medical History:  Diagnosis Date  . Diabetes mellitus without complication ([place])   . Gout   . Hyperlipemia   . Hypertension   . Neuropathy    Allergies  Allergen Reactions  . Erythromycin Other (See Comments)    Current Outpatient Medications on File Prior to Visit  Medication Sig Dispense Refill  . allopurinol (ZYLOPRIM) 100 MG tablet TAKE 1 TABLET BY MOUTH EVERY DAY 90 tablet 0  . amLODipine (NORVASC) 10 MG tablet TAKE 1 TABLET(10 MG) BY MOUTH DAILY 90 tablet 1   . atorvastatin (LIPITOR) 20 MG tablet Take 1 tablet (20 mg total) by mouth daily. 90 tablet 1  . blood glucose meter kit and supplies KIT Dispense based on patient and insurance preference. Use up to four times daily as directed. (FOR ICD-9 250.00, 250.01). 1 each 0  . carvedilol (COREG) 25 MG tablet TAKE 1 TABLET(25 MG) BY MOUTH TWICE DAILY WITH A MEAL 180 tablet 0  . cholecalciferol (VITAMIN D3) 25 MCG (1000 UT) tablet Take 1,000 Units by mouth daily.    . colchicine 0.6 MG tablet TAKE 2 TABLETS(1.2 MG) BY MOUTH AT ONSET FOR GOUT. MAY REPEAT 1 TABLET 0.6 MG IN 2 HOURS IF SYMPTOMS PERSIST 30 tablet 1  . cycloSPORINE (RESTASIS) 0.05 % ophthalmic emulsion Place 1 drop into both eyes 2 (two) times daily.     Marland Kitchen doxycycline (VIBRAMYCIN) 100 MG capsule Take 1 capsule (100 mg total) by mouth 2 (two) times daily. 20 capsule 0  . Misc. Devices [place] Shower chair. Dx- Diabetes mellitus 1 each 0  . Multiple Vitamins-Minerals (CENTRUM SILVER ADULT 50+ PO) Take 1 tablet by mouth daily.    . NONFORMULARY OR COMPOUNDED ITEM Peripheral Neuropathy Cream: Bupivacaine 1%, Doxepin 3%, Gabapentin 6%, Pentoxifylline 3%, Topiramate 1% Order faxed to [facility] 1 each 3  . sitaGLIPtin (JANUVIA) 50 MG tablet TAKE 1 TABLET(50 MG) BY MOUTH DAILY 90 tablet 0  . traMADol (ULTRAM) 50 MG tablet Take 50 mg by mouth at bedtime as needed.     No current facility-administered medications on file prior to visit.    Observations/Objective: Awake, alert,  oriented x3 Not in acute distress   CMP Latest Ref Rng & Units 02/11/2020 11/11/2019 09/20/2019  Glucose 65 - 99 mg/dL 131(H) 126(H) 175(H)  BUN 8 - 27 mg/dL 31(H) 35(H) 35(H)  Creatinine 0.57 - 1.00 mg/dL 1.30(H) 1.50(H) 1.62(H)  Sodium 134 - 144 mmol/L 141 143 139  Potassium 3.5 - 5.2 mmol/L 4.5 5.0 4.6  Chloride 96 - 106 mmol/L 106 105 105  CO2 20 - 29 mmol/L 24 22 23   Calcium 8.7 - 10.3 mg/dL 10.2 10.1 9.2  Total Protein 6.0 - 8.5 g/dL 7.3 7.8 -  Total  Bilirubin 0.0 - 1.2 mg/dL 0.3 0.3 -  Alkaline Phos 44 - 121 IU/L 74 84 -  AST 0 - 40 IU/L 14 16 -  ALT 0 - 32 IU/L 11 15 -    Lipid Panel     Component Value Date/Time   CHOL 166 12/24/2019 1159   TRIG 188 (H) 12/24/2019 1159   HDL 32 (L) 12/24/2019 1159   CHOLHDL 5.2 (H) 12/24/2019 1159   LDLCALC 101 (H) 12/24/2019 1159   LABVLDL 33 12/24/2019 1159    Lab Results  Component Value Date   HGBA1C 7.2 (A) 02/11/2020     Assessment and Plan: 1. Type 2 diabetes mellitus with other neurologic complication, with long-term current use of insulin (HCC) Controlled with ac of 7.2 Counseled on Diabetic diet, my plate method, 161 minutes of moderate intensity exercise/week Blood sugar logs with fasting goals of 80-120 mg/dl, random of less than 180 and in the event of sugars less than 60 mg/dl or greater than 400 mg/dl encouraged to notify the clinic. Advised on the need for annual eye exams, annual foot exams, Pneumonia vaccine. - sitaGLIPtin (JANUVIA) 50 MG tablet; TAKE 1 TABLET(50 MG) BY MOUTH DAILY  Dispense: 90 tablet; Refill: 1  2. Idiopathic chronic gout without tophus, unspecified site Stable - allopurinol (ZYLOPRIM) 100 MG tablet; Take 1 tablet (100 mg total) by mouth daily.  Dispense: 90 tablet; Refill: 1  3. Essential hypertension Controlled Counseled on blood pressure goal of less than 130/80, low-sodium, DASH diet, medication compliance, 150 minutes of moderate intensity exercise per week. Discussed medication compliance, adverse effects. - carvedilol (COREG) 25 MG tablet; TAKE 1 TABLET(25 MG) BY MOUTH TWICE DAILY WITH A MEAL  Dispense: 180 tablet; Refill: 1  4. Neck mass - Ambulatory referral to General Surgery   Follow Up Instructions: 3 months   I discussed the assessment and treatment plan with the patient. The patient was provided an opportunity to ask questions and all were answered. The patient agreed with the plan and demonstrated an understanding of the  instructions.   The patient was advised to call back or seek an in-person evaluation if the symptoms worsen or if the condition fails to improve as anticipated.     I provided 15 minutes total of non-face-to-face time during this encounter including median intraservice time, reviewing previous notes, investigations, ordering medications, medical decision making, coordinating care and patient verbalized understanding at the end of the visit.     Charlott Rakes, MD, FAAFP. [facility] and [place], [place]   05/11/2020, 10:44 AM

## 2020-06-10 ENCOUNTER — Encounter (HOSPITAL_BASED_OUTPATIENT_CLINIC_OR_DEPARTMENT_OTHER): Payer: Self-pay | Admitting: *Deleted

## 2020-06-10 ENCOUNTER — Other Ambulatory Visit: Payer: Self-pay

## 2020-06-10 ENCOUNTER — Emergency Department (HOSPITAL_BASED_OUTPATIENT_CLINIC_OR_DEPARTMENT_OTHER): Payer: Medicare Other

## 2020-06-10 ENCOUNTER — Emergency Department (HOSPITAL_BASED_OUTPATIENT_CLINIC_OR_DEPARTMENT_OTHER)
Admission: EM | Admit: 2020-06-10 | Discharge: 2020-06-10 | Disposition: A | Discharge status: Home / Self Care | Payer: Medicare Other | Attending: Emergency Medicine | Admitting: Emergency Medicine

## 2020-06-10 DIAGNOSIS — I10 Essential (primary) hypertension: Secondary | ICD-10-CM | POA: Diagnosis not present

## 2020-06-10 DIAGNOSIS — W010XXA Fall on same level from slipping, tripping and stumbling without subsequent striking against object, initial encounter: Secondary | ICD-10-CM | POA: Insufficient documentation

## 2020-06-10 DIAGNOSIS — Z79899 Other long term (current) drug therapy: Secondary | ICD-10-CM | POA: Diagnosis not present

## 2020-06-10 DIAGNOSIS — Y9301 Activity, walking, marching and hiking: Secondary | ICD-10-CM | POA: Insufficient documentation

## 2020-06-10 DIAGNOSIS — M79652 Pain in left thigh: Secondary | ICD-10-CM | POA: Diagnosis not present

## 2020-06-10 DIAGNOSIS — W19XXXA Unspecified fall, initial encounter: Secondary | ICD-10-CM

## 2020-06-10 DIAGNOSIS — Z7984 Long term (current) use of oral hypoglycemic drugs: Secondary | ICD-10-CM | POA: Insufficient documentation

## 2020-06-10 DIAGNOSIS — E1140 Type 2 diabetes mellitus with diabetic neuropathy, unspecified: Secondary | ICD-10-CM | POA: Insufficient documentation

## 2020-06-10 DIAGNOSIS — M25552 Pain in left hip: Secondary | ICD-10-CM | POA: Diagnosis not present

## 2020-06-10 DIAGNOSIS — Y92009 Unspecified place in unspecified non-institutional (private) residence as the place of occurrence of the external cause: Secondary | ICD-10-CM | POA: Insufficient documentation

## 2020-06-10 LAB — CBG MONITORING, ED: Glucose-Capillary: 202 mg/dL — ABNORMAL HIGH (ref 70–99)

## 2020-06-10 MED ORDER — ACETAMINOPHEN 325 MG PO TABS
650.0000 mg | ORAL_TABLET | Freq: Once | ORAL | Status: AC
  Administered 2020-06-10: 650 mg via ORAL
  Filled 2020-06-10: qty 2

## 2020-06-10 NOTE — Discharge Instructions (Addendum)
You were evaluated in the emergency department today after your fall at home.  Your physical exam and vital signs were very reassuring. There are no broken bones on your xrays.   You may be more sore over the next few days. You may continue to use tylenol as needed at home for your pain. You may follow up with your primary care doctor.  Please keep your appointment with your neurologist as scheduled for March 4.   Return to the ED if you develop worsening numbness, tingling, weakness in your legs, if you fall again, or if you develop any other new severe symptoms.

## 2020-06-10 NOTE — ED Triage Notes (Addendum)
C/o fall from standing x 1 hr ago , c/o left leg pain , c/o thigh and pelvic pain

## 2020-06-10 NOTE — ED Notes (Signed)
Family called to check on patient and requesting that it is her dinner time and Pt may have missed lunch. Pt takes pill for her diabetes. Family made aware that the nurse will have a blood sugar completed and snack given if approved by the EDP. Family acknowledged.

## 2020-06-10 NOTE — ED Provider Notes (Signed)
[facility] EMERGENCY DEPARTMENT Provider Note   CSN: [id number] Arrival date & time: 06/10/20  1407     History Chief Complaint  Patient presents with  . Fall    Mariah Mason is a 70 y.o. female presents to the emergency department for evaluation after mechanical fall at home.  She states that she has severe diabetic neuropathy and was wearing house shoes that were too large for her feet.  When she walked from her bedroom into the hallway, her shoe got caught and caused pain in her foot.  When this happened she fell down onto her left side on the carpet.  She denies any head trauma, LOC, nausea, vomiting, blurry vision, double vision since that time.  She endorses pain to her left hip and thigh, and stiffness in her left leg.  She states that she has an appointment with neurologist on March 4.  She denies any dizziness, lightheadedness, weakness.  She has taken Tylenol at home for pain.  I personally viewed the patient medical records.  She is history of type 2 diabetes with neuropathy.  She has history of hypertension and hyperlipidemia as well.  HPI     Past Medical History:  Diagnosis Date  . Diabetes mellitus without complication ([place])   . Gout   . Hyperlipemia   . Hypertension   . Neuropathy     Patient Active Problem List   Diagnosis Date Noted  . Gout   . Hypertension   . Type 2 diabetes mellitus ([place]) 02/18/2019  . AKI (acute kidney injury) ([place]) 02/18/2019  . Hyperkalemia 02/18/2019  . Metabolic acidosis 40/98/1191  . Hypoglycemia 02/17/2019  . Long term current use of oral hypoglycemic drug 05/23/2017  . Presbyopia of both eyes 05/23/2017  . Vitreous floaters of both eyes 05/23/2017  . Keratoconjunctivitis sicca of both eyes not specified as Sjogren's 10/16/2016  . Nuclear sclerotic cataract of both eyes 10/16/2016    History reviewed. No pertinent surgical history.   OB History   No obstetric history on file.     No family history on  file.  Social History   Tobacco Use  . Smoking status: Never Smoker  . Smokeless tobacco: Never Used  Vaping Use  . Vaping Use: Never used  Substance Use Topics  . Alcohol use: Never  . Drug use: Never    Home Medications Prior to Admission medications   Medication Sig Start Date End Date Taking? Authorizing Provider  allopurinol (ZYLOPRIM) 100 MG tablet Take 1 tablet (100 mg total) by mouth daily. 05/11/20   Charlott Rakes, MD  amLODipine (NORVASC) 10 MG tablet TAKE 1 TABLET(10 MG) BY MOUTH DAILY 02/11/20   Charlott Rakes, MD  atorvastatin (LIPITOR) 20 MG tablet Take 1 tablet (20 mg total) by mouth daily. 02/11/20   Charlott Rakes, MD  blood glucose meter kit and supplies KIT Dispense based on patient and insurance preference. Use up to four times daily as directed. (FOR ICD-9 250.00, 250.01). 02/20/19   Donne Hazel, MD  carvedilol (COREG) 25 MG tablet TAKE 1 TABLET(25 MG) BY MOUTH TWICE DAILY WITH A MEAL 05/11/20   Charlott Rakes, MD  cholecalciferol (VITAMIN D3) 25 MCG (1000 UT) tablet Take 1,000 Units by mouth daily.    [provider]  colchicine 0.6 MG tablet TAKE 2 TABLETS(1.2 MG) BY MOUTH AT ONSET FOR GOUT. MAY REPEAT 1 TABLET 0.6 MG IN 2 HOURS IF SYMPTOMS PERSIST 03/30/20   Charlott Rakes, MD  cycloSPORINE (RESTASIS) 0.05 % ophthalmic  emulsion Place 1 drop into both eyes 2 (two) times daily.  12/05/18   [provider]  doxycycline (VIBRAMYCIN) 100 MG capsule Take 1 capsule (100 mg total) by mouth 2 (two) times daily. 04/03/20   Tacy Learn, PA-C  Misc. Devices [place] Shower chair. Dx- Diabetes mellitus 08/12/19   Charlott Rakes, MD  Multiple Vitamins-Minerals (CENTRUM SILVER ADULT 50+ PO) Take 1 tablet by mouth daily.    [provider]  NONFORMULARY OR COMPOUNDED ITEM Peripheral Neuropathy Cream: Bupivacaine 1%, Doxepin 3%, Gabapentin 6%, Pentoxifylline 3%, Topiramate 1% Order faxed to [facility] 10/05/19   [place], Stephani Police, DPM   sitaGLIPtin (JANUVIA) 50 MG tablet TAKE 1 TABLET(50 MG) BY MOUTH DAILY 05/11/20   Charlott Rakes, MD  traMADol (ULTRAM) 50 MG tablet Take 50 mg by mouth at bedtime as needed. 06/02/19   [provider]    Allergies    Erythromycin  Review of Systems   Review of Systems  Constitutional: Negative.   HENT: Negative.   Respiratory: Negative.   Cardiovascular: Negative.   Gastrointestinal: Negative.   Musculoskeletal: Positive for myalgias.  Skin: Negative.   Neurological: Negative.   Hematological: Negative.     Physical Exam Updated Vital Signs BP (!) 182/75   Pulse 100   Resp 18   Ht 5' 2"  (1.575 m)   Wt 92.5 kg   SpO2 100%   BMI 37.31 kg/m   Physical Exam Vitals and nursing note reviewed.  Constitutional:      Appearance: She is obese.  HENT:     Head: Normocephalic and atraumatic.     Nose: Nose normal.     Mouth/Throat:     Mouth: Mucous membranes are moist.     Pharynx: Oropharynx is clear. Uvula midline. No oropharyngeal exudate or posterior oropharyngeal erythema.  Eyes:     General:        Right eye: No discharge.        Left eye: No discharge.     Extraocular Movements: Extraocular movements intact.     Conjunctiva/sclera: Conjunctivae normal.     Pupils: Pupils are equal, round, and reactive to light.  Neck:     Trachea: Trachea and phonation normal.  Cardiovascular:     Rate and Rhythm: Normal rate and regular rhythm.     Pulses:          Radial pulses are 2+ on the right side and 2+ on the left side.       Dorsalis pedis pulses are 1+ on the right side and 1+ on the left side.     Heart sounds: Normal heart sounds. No murmur heard.   Pulmonary:     Effort: Pulmonary effort is normal. No prolonged expiration or respiratory distress.     Breath sounds: Normal breath sounds. No wheezing or rales.  Chest:     Chest wall: No swelling, tenderness, crepitus or edema.  Abdominal:     General: Bowel sounds are normal. There is no distension.      Palpations: Abdomen is soft.     Tenderness: There is no abdominal tenderness. There is no guarding or rebound.  Musculoskeletal:        General: Tenderness present. No swelling or deformity.     Right shoulder: Normal.     Left shoulder: Normal.     Right upper arm: Normal.     Left upper arm: Normal.     Right elbow: Normal.     Left elbow: Normal.  Right forearm: Normal.     Left forearm: Normal.     Right wrist: Normal.     Left wrist: Normal.     Right hand: Normal.     Left hand: Normal.     Cervical back: Normal and neck supple. No rigidity, spasms, tenderness or crepitus. No pain with movement, spinous process tenderness or muscular tenderness.     Thoracic back: Normal. No spasms, tenderness or bony tenderness.     Lumbar back: Normal. No spasms, tenderness or bony tenderness.     Right hip: Normal.     Left hip: Tenderness present. No bony tenderness or crepitus. Normal range of motion.     Right upper leg: Normal.     Left upper leg: Tenderness present. No edema, deformity or bony tenderness.     Right knee: Normal.     Left knee: Normal.     Right lower leg: Normal. No edema.     Left lower leg: Normal. No edema.     Right ankle: Normal.     Right Achilles Tendon: Normal.     Left ankle: Normal.     Left Achilles Tendon: Normal.     Right foot: Normal.     Left foot: Normal.       Legs:  Lymphadenopathy:     Cervical: No cervical adenopathy.  Skin:    General: Skin is warm and dry.     Capillary Refill: Capillary refill takes less than 2 seconds.  Neurological:     General: No focal deficit present.     Mental Status: She is alert and oriented to person, place, and time. Mental status is at baseline.     Cranial Nerves: Cranial nerves are intact.     Sensory: Sensation is intact.     Motor: Motor function is intact.     Gait: Gait abnormal.     Comments: Antalgic gait, however patient is able to walk independently using her cane, which she uses at  baseline.  Neurovascularly intact in all 4 extremities.  Psychiatric:        Mood and Affect: Mood normal.     ED Results / Procedures / Treatments   Labs (all labs ordered are listed, but only abnormal results are displayed) Labs Reviewed  CBG MONITORING, ED - Abnormal; Notable for the following components:      Result Value   Glucose-Capillary 202 (*)    All other components within normal limits    EKG None  Radiology DG Pelvis 1-2 Views  Result Date: 06/10/2020 CLINICAL DATA:  [place] earlier today. Left leg went numb. Complaining of left thigh and pelvic pain. EXAM: PELVIS - 1-2 VIEW COMPARISON:  None. FINDINGS: No fracture or bone lesion. Hip joints, SI joints and symphysis pubis are normally spaced and aligned. Soft tissues are unremarkable. IMPRESSION: Negative. Electronically Signed   By: Lajean Manes M.D.   On: 06/10/2020 15:36   DG FEMUR MIN 2 VIEWS LEFT  Result Date: 06/10/2020 CLINICAL DATA:  [place] earlier today. Left leg went numb. Complaining of left thigh and pelvic pain. EXAM: LEFT FEMUR 2 VIEWS COMPARISON:  None. FINDINGS: No fracture or bone lesion. Hip and knee joints are normally aligned. Knee joint shows advanced tricompartmental degenerative changes. No convincing joint effusion. There are arterial vascular calcifications along the femoral and popliteal arteries. Soft tissues otherwise unremarkable. IMPRESSION: 1. No fracture or acute finding. Electronically Signed   By: Lajean Manes M.D.   On: 06/10/2020 15:38  Procedures Procedures   Medications Ordered in ED Medications  acetaminophen (TYLENOL) tablet 650 mg (650 mg Oral Given 06/10/20 1733)    ED Course  I have reviewed the triage vital signs and the nursing notes.  Pertinent labs & imaging results that were available during my care of the patient were reviewed by me and considered in my medical decision making (see chart for details).    MDM Rules/Calculators/A&P                          70 year old female who referred to the emergent department after mechanical fall at home.  She endorses pain to her left hip and leg.  She is ambulatory in triage.  Patient hypertensive on intake to 182/75.  She states she has taken her antihypertensive medication today; states she is very nervous.  Vital signs otherwise normal.  Physical exam is reassuring.  Cardiopulmonary exam is normal, abdominal exam is benign.  Patient is neurologically intact.  She has mild midshaft left femur tenderness to palpation without bruising, erythema, crepitus, or deformity.  Plain films obtained in triage of the left femur and pelvis are negative for acute fracture or dislocation.  Tylenol offered in the emergency department.  Patient was ambulated in the emergency department and was able to ambulate independently using her cane, which she uses a baseline.  Given reassuring physical exam, vital signs, and imaging studies, no further work-up is warranted in the ED at this time.  She may continue to take OTC analgesia as needed at home.  Recommend close follow-up with her primary care doctor.  Chaka voiced understanding of her medical evaluation and treatment plan.  Each of her questions was answered to her expressed satisfaction.  Return precautions given.  Patient is stable and appropriate for discharge at this time.  This chart was dictated using voice recognition software, Dragon. Despite the best efforts of this provider to proofread and correct errors, errors may still occur which can change documentation meaning.  Final Clinical Impression(s) / ED Diagnoses Final diagnoses:  Fall, initial encounter    Rx / DC Orders ED Discharge Orders    None       Aura Dials 06/10/20 2025    Quintella Reichert, MD 06/10/20 2357

## 2020-06-23 ENCOUNTER — Other Ambulatory Visit: Payer: Self-pay | Admitting: Family Medicine

## 2020-06-23 DIAGNOSIS — M1A00X0 Idiopathic chronic gout, unspecified site, without tophus (tophi): Secondary | ICD-10-CM

## 2020-06-23 NOTE — Telephone Encounter (Signed)
Requested Prescriptions  Pending Prescriptions Disp Refills  . colchicine 0.6 MG tablet [Pharmacy Med Name: COLCHICINE 0.6MG  TABLETS] 30 tablet 1    Sig: TAKE 2 TABLETS(1.2 MG) BY MOUTH AT ONSET FOR GOUT. MAY REPEAT 1 TABLET 0.6 MG IN 2 HOURS IF SYMPTOMS PERSIST     Endocrinology:  Gout Agents Failed - 06/23/2020  2:02 PM      Failed - Uric Acid in normal range and within 360 days    No results found for: POCURA, LABURIC       Failed - Cr in normal range and within 360 days    Creatinine, Ser  Date Value Ref Range Status  02/11/2020 1.30 (H) 0.57 - 1.00 mg/dL Final         Passed - Valid encounter within last 12 months    Recent Outpatient Visits          1 month ago Neck mass   [facility] [place], [place], MD   4 months ago Type 2 diabetes mellitus with other neurologic complication, with long-term current use of insulin (HCC)   [facility] [place], [place], MD   7 months ago Type 2 diabetes mellitus with other specified complication, with long-term current use of insulin (HCC)   [facility] [place], [place], MD   10 months ago Type 2 diabetes mellitus with other specified complication, with long-term current use of insulin (HCC)   [facility] [place], [place], MD   11 months ago Type 2 diabetes mellitus with other specified complication, with long-term current use of insulin (HCC)   [facility] Hoy Register, MD      Future Appointments            In 2 months Hoy Register, MD [facility]

## 2020-07-01 ENCOUNTER — Ambulatory Visit: Payer: Medicare Other | Admitting: Neurology

## 2020-07-13 ENCOUNTER — Ambulatory Visit (INDEPENDENT_AMBULATORY_CARE_PROVIDER_SITE_OTHER): Payer: Medicare Other | Admitting: Podiatry

## 2020-07-13 ENCOUNTER — Encounter: Payer: Self-pay | Admitting: Podiatry

## 2020-07-13 ENCOUNTER — Other Ambulatory Visit: Payer: Self-pay

## 2020-07-13 DIAGNOSIS — M79609 Pain in unspecified limb: Secondary | ICD-10-CM | POA: Diagnosis not present

## 2020-07-13 DIAGNOSIS — M2041 Other hammer toe(s) (acquired), right foot: Secondary | ICD-10-CM

## 2020-07-13 DIAGNOSIS — M2042 Other hammer toe(s) (acquired), left foot: Secondary | ICD-10-CM

## 2020-07-13 DIAGNOSIS — E1142 Type 2 diabetes mellitus with diabetic polyneuropathy: Secondary | ICD-10-CM

## 2020-07-13 DIAGNOSIS — B351 Tinea unguium: Secondary | ICD-10-CM

## 2020-07-20 NOTE — Progress Notes (Signed)
Subjective: Mariah Mason presents today for annual diabetic foot examination and painful mycotic nails b/l that are difficult to trim. Pain interferes with ambulation. Aggravating factors include wearing enclosed shoe gear. Pain is relieved with periodic professional debridement.   Last checked her blood sugar yesterday and it was 100 mg/dl.   She voices no new pedal problems on today's visit.  Hoy Register, MD is patient's PCP. Last visit was: 05/11/2020.  Allergies  Allergen Reactions  . Erythromycin Other (See Comments)   Objective: Mariah Mason is a pleasant 70 y.o.  African American female, obese in NAD. AAO x 3.  There were no vitals filed for this visit.  Vascular Examination: Neurovascular status unchanged b/l lower extremities. Capillary refill time to digits immediate b/l. Palpable DP pulses b/l. Palpable PT pulses b/l. Pedal hair sparse b/l. Skin temperature gradient within normal limits b/l. No pain with calf compression b/l. No edema noted b/l.  Dermatological Examination: Pedal skin with normal turgor, texture and tone bilaterally. No open wounds bilaterally. No interdigital macerations bilaterally. Toenails 1-5 b/l elongated, discolored, dystrophic, thickened, crumbly with subungual debris and tenderness to dorsal palpation.   Musculoskeletal: Normal muscle strength 5/5 to all lower extremity muscle groups bilaterally. No pain crepitus or joint limitation noted with ROM b/l. Hammertoes noted to the 2-5 bilaterally. Wearing appropriate fitting shoe gear. Utilizes cane for ambulation assistance.  Neurological Examination: Pt has subjective symptoms of neuropathy. Protective sensation intact 5/5 intact bilaterally with 10g monofilament b/l. Vibratory sensation intact b/l. Proprioception intact bilaterally.   Hemoglobin A1C Latest Ref Rng & Units 02/11/2020 11/11/2019  HGBA1C 0.0 - 7.0 % 7.2(A) 6.9  Some recent data might be hidden   Assessment: 1. Pain due to  onychomycosis of nail   2. Acquired hammertoes of both feet   3. Diabetic peripheral neuropathy associated with type 2 diabetes mellitus (HCC)     Plan: -Examined patient. -Continue diabetic foot care principles. -Toenails 1-5 b/l were debrided in length and girth with sterile nail nippers and dremel without iatrogenic bleeding.  -Patient/POA to call should there be question/concern in the interim. -Patient to continue soft, supportive shoe gear daily. -Patient to report any pedal injuries to medical professional immediately.  Return in about 3 months (around 10/13/2020).  Freddie Breech, DPM

## 2020-08-02 ENCOUNTER — Encounter: Payer: Self-pay | Admitting: Neurology

## 2020-08-02 ENCOUNTER — Ambulatory Visit (INDEPENDENT_AMBULATORY_CARE_PROVIDER_SITE_OTHER): Payer: Medicare Other | Admitting: Neurology

## 2020-08-02 VITALS — BP 149/74 | HR 92 | Ht 62.0 in | Wt 212.5 lb

## 2020-08-02 DIAGNOSIS — E1142 Type 2 diabetes mellitus with diabetic polyneuropathy: Secondary | ICD-10-CM | POA: Diagnosis not present

## 2020-08-02 DIAGNOSIS — R269 Unspecified abnormalities of gait and mobility: Secondary | ICD-10-CM | POA: Diagnosis not present

## 2020-08-02 MED ORDER — MEDICAL COMPRESSION SOCKS MISC: 3 refills | Status: AC

## 2020-08-02 NOTE — Progress Notes (Signed)
Chief Complaint  Patient presents with  . New Patient (Initial Visit)    She is here with her daughter, Mariah Mason. She has tried gabapentin 364m TID but it made her too drowsy and it was not helpful. Says duloxetine 639mQD also made her sleepy. She is currently using a compound cream, ordered by her podiatrist. She purchased a circulation system and nerve muscle stimulator machine off [employer] (like a TENS unit). She uses it twice daily and feels there is benefit with it.       ASSESSMENT AND PLAN  ArAnzley Mason a [age].o. female   Diabetic peripheral neuropathy Gait abnormality  Long history of diabetes, less dependent sensory changes, evidence of diabetic peripheral neuropathy, lower extremity pitting edema, likely all contributed to her complaints of lower extremity feet pulling sensation,  Gait abnormality are combination of her bilateral knee valgus, flatfeet, diabetic peripheral neuropathy, overweight  EMG nerve conduction study  Referral to physical therapy  Laboratory evaluation to rule out other treatable cause for neuropathy   DIAGNOSTIC DATA (LABS, IMAGING, TESTING) - I reviewed patient records, labs, notes, testing and imaging myself where available.  Laboratory evaluation: Elevated creatinine 1.3, GFR 42, A1c 7.2, October 2020 was 7.6,  HISTORICAL  Mariah Mason a [age]ear old female, seen in request by her podiatrist Dr. GaMarzetta Boardand her primary care physician Dr. NeMargarita RanaEnCharlane Ferrettifor evaluation of bilateral feet paresthesia, gait abnormality, initial evaluation was on August 02, 2020  I reviewed and summarized the referring note. PMHX. DM for 20 years Gout  HTN  Patient is a poor historian, suffered diabetes for many years, had a gradual onset of bilateral feet numbness tingling, needle prick, gradually getting worse since 2021, paresthesia become more persistent, reporting uncomfortable sensation, also began to involve bilateral fingertips, she  denies significant pain,  She also noticed gradual onset worsening gait abnormality, she had a chronic bilateral knee pain, valgus knee, flatfeet, always walk was awkward posture, has much worsened since 2021,    REVIEW OF SYSTEMS: Full 14 system review of systems performed and notable only for as above All other review of systems were negative.  PHYSICAL EXAM   Vitals:   08/02/20 0925  BP: (!) 149/74  Pulse: 92  Weight: 212 lb 8 oz (96.4 kg)  Height: _0  (1.575 m)   Not recorded     Body mass index is 38.87 kg/m.  PHYSICAL EXAMNIATION:  Gen: NAD, conversant, well nourised, well groomed                     Cardiovascular: Regular rate rhythm, no peripheral edema, warm, nontender. Eyes: Conjunctivae clear without exudates or hemorrhage Neck: Supple, no carotid bruits. Pulmonary: Clear to auscultation bilaterally   NEUROLOGICAL EXAM:  MENTAL STATUS: Speech:    Speech is normal; fluent and spontaneous with normal comprehension.  Cognition:     Orientation to time, place and person     Normal recent and remote memory     Normal Attention span and concentration     Normal Language, naming, repeating,spontaneous speech     Fund of knowledge   CRANIAL NERVES: CN II: Visual fields are full to confrontation. Pupils are round equal and briskly reactive to light. CN III, IV, VI: extraocular movement are normal. No ptosis. CN V: Facial sensation is intact to light touch CN VII: Face is symmetric with normal eye closure  CN VIII: Hearing is normal to causal conversation. CN IX, X: Phonation is  normal. CN XI: Head turning and shoulder shrug are intact  MOTOR: There is no pronator drift of out-stretched arms. Muscle bulk and tone are normal. Muscle strength is normal.  REFLEXES: Reflexes are 1 and symmetric at the biceps, triceps, knees, and absent at ankles. Plantar responses are flexor.  SENSORY: Bilateral lower extremity pitting edema, length dependent decreased  vibratory sensation, pinprick to distal shin level  COORDINATION: There is no trunk or limb dysmetria noted.  GAIT/STANCE: She needs push-up to get up from seated position, unsteady, bilateral valgus knee, flatfeet, could not stand up on tiptoe, heel  ALLERGIES: Allergies  Allergen Reactions  . Erythromycin Other (See Comments)    Does not remember reaction.    HOME MEDICATIONS: Current Outpatient Medications  Medication Sig Dispense Refill  . allopurinol (ZYLOPRIM) 100 MG tablet Take 1 tablet (100 mg total) by mouth daily. 90 tablet 1  . amLODipine (NORVASC) 10 MG tablet TAKE 1 TABLET(10 MG) BY MOUTH DAILY 90 tablet 1  . blood glucose meter kit and supplies KIT Dispense based on patient and insurance preference. Use up to four times daily as directed. (FOR ICD-9 250.00, 250.01). 1 each 0  . carvedilol (COREG) 25 MG tablet TAKE 1 TABLET(25 MG) BY MOUTH TWICE DAILY WITH A MEAL 180 tablet 1  . cholecalciferol (VITAMIN D3) 25 MCG (1000 UT) tablet Take 1,000 Units by mouth daily.    . colchicine 0.6 MG tablet TAKE 2 TABLETS(1.2 MG) BY MOUTH AT ONSET FOR GOUT. MAY REPEAT 1 TABLET 0.6 MG IN 2 HOURS IF SYMPTOMS PERSIST 30 tablet 1  . cycloSPORINE (RESTASIS) 0.05 % ophthalmic emulsion Place 1 drop into both eyes 2 (two) times daily.     Marland Kitchen doxycycline (VIBRAMYCIN) 100 MG capsule Take 1 capsule (100 mg total) by mouth 2 (two) times daily. 20 capsule 0  . ezetimibe (ZETIA) 10 MG tablet Take 10 mg by mouth daily.    . hydrALAZINE (APRESOLINE) 25 MG tablet Take 25 mg by mouth 2 (two) times daily.    . Misc. Devices [place] Shower chair. Dx- Diabetes mellitus 1 each 0  . Multiple Vitamins-Minerals (CENTRUM SILVER ADULT 50+ PO) Take 1 tablet by mouth daily.    . NONFORMULARY OR COMPOUNDED ITEM Peripheral Neuropathy Cream: Bupivacaine 1%, Doxepin 3%, Gabapentin 6%, Pentoxifylline 3%, Topiramate 1% Order faxed to [place] 1 each 3  . sitaGLIPtin (JANUVIA) 50 MG tablet TAKE 1 TABLET(50 MG) BY  MOUTH DAILY 90 tablet 1  . traMADol (ULTRAM) 50 MG tablet Take 50 mg by mouth at bedtime as needed.     No current facility-administered medications for this visit.    PAST MEDICAL HISTORY: Past Medical History:  Diagnosis Date  . Diabetes mellitus without complication ([place])   . Gout   . Hyperlipemia   . Hypertension   . Neuropathy     PAST SURGICAL HISTORY: Past Surgical History:  Procedure Laterality Date  . THYROID SURGERY      FAMILY HISTORY: Family History  Problem Relation Age of Onset  . Heart attack Mother   . Kidney failure Father     SOCIAL HISTORY: Social History   Socioeconomic History  . Marital status: Single    Spouse name: Not on file  . Number of children: 6  . Years of education: 9th grade  . Highest education level: Not on file  Occupational History  . Occupation: Retired  Tobacco Use  . Smoking status: Never Smoker  . Smokeless tobacco: Never Used  Vaping Use  .  Vaping Use: Never used  Substance and Sexual Activity  . Alcohol use: Never  . Drug use: Never  . Sexual activity: Not on file  Other Topics Concern  . Not on file  Social History Narrative   Lives with family.   Right-handed.   No daily use of caffeine.   Social Determinants of Health   Financial Resource Strain: Not on file  Food Insecurity: Not on file  Transportation Needs: Not on file  Physical Activity: Not on file  Stress: Not on file  Social Connections: Not on file  Intimate Partner Violence: Not on file      Marcial Pacas, M.D. Ph.D.  [facility] [address] [place],  [address] Ph: [phone] Fax: [phone]  CC:  [name], DPM [address],  [place] [address]  Charlott Rakes, MD

## 2020-08-03 LAB — COMPREHENSIVE METABOLIC PANEL
ALT: 12 IU/L (ref 0–32)
AST: 12 IU/L (ref 0–40)
Albumin/Globulin Ratio: 1.5 (ref 1.2–2.2)
Albumin: 4.4 g/dL (ref 3.8–4.8)
Alkaline Phosphatase: 83 IU/L (ref 44–121)
BUN/Creatinine Ratio: 24 (ref 12–28)
BUN: 41 mg/dL — ABNORMAL HIGH (ref 8–27)
Bilirubin Total: 0.2 mg/dL (ref 0.0–1.2)
CO2: 20 mmol/L (ref 20–29)
Calcium: 9.7 mg/dL (ref 8.7–10.3)
Chloride: 105 mmol/L (ref 96–106)
Creatinine, Ser: 1.71 mg/dL — ABNORMAL HIGH (ref 0.57–1.00)
Globulin, Total: 2.9 g/dL (ref 1.5–4.5)
Glucose: 176 mg/dL — ABNORMAL HIGH (ref 65–99)
Potassium: 4.4 mmol/L (ref 3.5–5.2)
Sodium: 144 mmol/L (ref 134–144)
Total Protein: 7.3 g/dL (ref 6.0–8.5)
eGFR: 32 mL/min/{1.73_m2} — ABNORMAL LOW (ref >59)

## 2020-08-03 LAB — VITAMIN B12: Vitamin B-12: 1261 pg/mL — ABNORMAL HIGH (ref 232–1245)

## 2020-08-03 LAB — TSH: TSH: 3.76 u[IU]/mL (ref 0.450–4.500)

## 2020-08-03 LAB — CBC WITH DIFFERENTIAL/PLATELET
Basophils Absolute: 0 10*3/uL (ref 0.0–0.2)
Basos: 1 %
EOS (ABSOLUTE): 0.1 10*3/uL (ref 0.0–0.4)
Eos: 1 %
Hematocrit: 36.3 % (ref 34.0–46.6)
Hemoglobin: 11.8 g/dL (ref 11.1–15.9)
Immature Grans (Abs): 0 10*3/uL (ref 0.0–0.1)
Immature Granulocytes: 0 %
Lymphocytes Absolute: 1.8 10*3/uL (ref 0.7–3.1)
Lymphs: 32 %
MCH: 26.8 pg (ref 26.6–33.0)
MCHC: 32.5 g/dL (ref 31.5–35.7)
MCV: 82 fL (ref 79–97)
Monocytes Absolute: 0.4 10*3/uL (ref 0.1–0.9)
Monocytes: 8 %
Neutrophils Absolute: 3.3 10*3/uL (ref 1.4–7.0)
Neutrophils: 58 %
Platelets: 279 10*3/uL (ref 150–450)
RBC: 4.41 x10E6/uL (ref 3.77–5.28)
RDW: 12.9 % (ref 11.7–15.4)
WBC: 5.6 10*3/uL (ref 3.4–10.8)

## 2020-08-03 LAB — C-REACTIVE PROTEIN: CRP: 2 mg/L (ref 0–10)

## 2020-08-03 LAB — RPR: RPR Ser Ql: NONREACTIVE

## 2020-08-03 LAB — HGB A1C W/O EAG: Hgb A1c MFr Bld: 7.9 % — ABNORMAL HIGH (ref 4.8–5.6)

## 2020-08-03 LAB — FOLATE: Folate: >20 ng/mL (ref >3.0)

## 2020-08-03 LAB — SEDIMENTATION RATE: Sed Rate: 51 mm/hr — ABNORMAL HIGH (ref 0–40)

## 2020-08-03 LAB — CK: Total CK: 65 U/L (ref 32–182)

## 2020-08-03 LAB — ANA W/REFLEX IF POSITIVE: Anti Nuclear Antibody (ANA): NEGATIVE

## 2020-08-04 ENCOUNTER — Telehealth: Payer: Self-pay | Admitting: Neurology

## 2020-08-04 NOTE — Telephone Encounter (Signed)
Please call patient, laboratory evaluation showed   Elevated A1c 7.9, I have forwarded the results to her Charlott Rakes, MD  Worsening kidney function, creatinine 1.71, GFR of 32  Mild elevated ESR of unknown clinical significance, rest of the laboratory evaluation showed no significant abnormality.  She should contact her primary care doctor for better control of her diabetes,

## 2020-08-04 NOTE — Telephone Encounter (Signed)
I spoke to the patient's daughter on [place]. She verbalized understanding of the lab findings. Her mother has an appt on 08/25/20 with her PCP. They will discuss the labs in more detail with her.

## 2020-08-08 ENCOUNTER — Encounter: Payer: Medicare Other | Admitting: Neurology

## 2020-08-08 ENCOUNTER — Other Ambulatory Visit: Payer: Self-pay

## 2020-08-08 ENCOUNTER — Ambulatory Visit: Payer: Medicare Other | Attending: Neurology

## 2020-08-08 DIAGNOSIS — R2681 Unsteadiness on feet: Secondary | ICD-10-CM | POA: Insufficient documentation

## 2020-08-08 DIAGNOSIS — E1142 Type 2 diabetes mellitus with diabetic polyneuropathy: Secondary | ICD-10-CM | POA: Diagnosis present

## 2020-08-08 DIAGNOSIS — M6281 Muscle weakness (generalized): Secondary | ICD-10-CM | POA: Insufficient documentation

## 2020-08-08 NOTE — Patient Instructions (Signed)
[id number]

## 2020-08-09 NOTE — Addendum Note (Signed)
Addended by: Hildred Laser on: 08/09/2020 04:46 PM   Modules accepted: Orders

## 2020-08-09 NOTE — Therapy (Signed)
[facility] [address] [place], [place], [address] Phone: [phone]   Fax:  [phone]  Physical Therapy Evaluation  Patient Details  Name: Mariah Mason MRN: [id number] Date of Birth: 1950-07-21 Referring Provider (PT): Dr. Terrace Arabia   Encounter Date: 08/08/2020   PT End of Session - 08/09/20 1633    Visit Number 1    Number of Visits 9    Date for PT Re-Evaluation 10/11/20    Authorization Type medicare/medicaid    PT Start Time 1230    PT Stop Time 1315    PT Time Calculation (min) 45 min    Equipment Utilized During Treatment Gait belt    Activity Tolerance Patient tolerated treatment well    Behavior During Therapy [facility] for tasks assessed/performed           Past Medical History:  Diagnosis Date  . Diabetes mellitus without complication (HCC)   . Gout   . Hyperlipemia   . Hypertension   . Neuropathy     Past Surgical History:  Procedure Laterality Date  . THYROID SURGERY      There were no vitals filed for this visit.    Subjective Assessment - 08/08/20 1235    Subjective describes a history of 1 year B foot tightness, tried to manage with meds but does not like the side effects, limits her ability to stand and walk for prolonged periods    Patient is accompained by: Family member    Pertinent History Long history of diabetes, less dependent sensory changes, evidence of diabetic peripheral neuropathy, lower extremity pitting edema, likely all contributed to her complaints of lower extremity feet pulling sensation,              Gait abnormality are combination of her bilateral knee valgus, flatfeet, diabetic peripheral neuropathy, overweight              EMG nerve conduction study              Referral to physical therapy              Laboratory evaluation to rule out other treatable cause for neuropathy    Limitations Standing;Walking    How long can you sit comfortably? ulimited    How long can you  stand comfortably? <10 min    How long can you walk comfortably? <10 min    Currently in Pain? No/denies               08/08/20 0001  Assessment  Medical Diagnosis neuropathy  Referring Provider (PT) Dr. Terrace Arabia  Onset Date/Surgical Date 08/02/20  Next MD Visit TBD  Precautions  Precautions Fall  Balance Screen  Has the patient fallen in the past 6 months Yes  How many times? 1  Has the patient had a decrease in activity level because of a fear of falling?  Yes  Is the patient reluctant to leave their home because of a fear of falling?  No  Home Teaching laboratory technician residence  Living Arrangements Children;Other relatives  Type of Home House  Home Access Stairs to enter  Entrance Stairs-Number of Steps 2  Entrance Stairs-Rails Left  Home Layout One level  Home Equipment Grab bars - tub/shower;Shower seat  Prior Function  Level of Independence Independent with basic ADLs;Independent with household mobility with device  Vocation Retired  Estate agent to [employer]  Five time sit to stand comments  needs UE assist  Comments  5x STS 27.03s  Ambulation/Gait  Ambulation/Gait Yes  Ambulation/Gait Assistance 4: Min guard  Ambulation Distance (Feet) 25 Feet  Assistive device Straight cane  Gait Pattern Step-through pattern;Decreased step length - right;Decreased step length - left  Ambulation Surface Level;Indoor  High Level Balance  High Level Balance Comments MCTSIB 30s at #1,2, 3, 4                Objective measurements completed on examination: See above findings.               PT Education - 08/08/20 1300    Person(s) Educated Patient;Child(ren)    Methods Explanation;Demonstration;Tactile cues;Verbal cues;Handout    Comprehension Verbalized understanding;Returned demonstration            PT Short Term Goals - 08/09/20 0853      PT SHORT TERM GOAL #1   Title STGs=LTGs             PT Long Term Goals -  08/09/20 1024      PT LONG TERM GOAL #1   Title patient to demo final HEP back to PT w/o need of cuing    Baseline initial HEP isuued    Time 9    Period Weeks    Status New    Target Date 10/11/20      PT LONG TERM GOAL #2   Title patient to complete 5x STS in <22s    Baseline 5x STS in 27.03s    Time 9    Period Weeks    Status New    Target Date 10/11/20      PT LONG TERM GOAL #3   Title Patient to ambulate 233ft with LRAD across level ground under S    Baseline in clinic distances with cane and CG assist    Time 9    Period Weeks    Status New    Target Date 10/11/20      PT LONG TERM GOAL #4   Title Assess DGI and assess goal    Baseline TBD    Time 9    Period Weeks    Status New    Target Date 10/11/20      PT LONG TERM GOAL #5   Title Assess BERG and set appropriate goal    Baseline TBD    Time 9    Period Weeks    Status New    Target Date 10/11/20                  Plan - 08/08/20 0849    Clinical Impression Statement Patient presents with decreased BLE strength, ROM and endurance due in part to neuropathy and orthopedic impediments including excessive pronation and knee valgus B, strength deficits noted in LLE however overall balance was good    Personal Factors and Comorbidities Age;Comorbidity 2    Comorbidities DM, neuropathy    Examination-Activity Limitations Locomotion Level    Stability/Clinical Decision Making Stable/Uncomplicated    Clinical Decision Making Low    Rehab Potential Good    PT Frequency 1x / week    PT Duration 8 weeks    PT Treatment/Interventions ADLs/Self Care Home Management;Aquatic Therapy;DME Instruction;Gait training;Stair training;Functional mobility training;Therapeutic activities;Therapeutic exercise;Balance training;Neuromuscular re-education;Patient/family education;Orthotic Fit/Training    PT Next Visit Plan Assess TUG and DGI    PT Home Exercise Plan [id number]    Consulted and Agree with Plan of Care  Patient           Patient  will benefit from skilled therapeutic intervention in order to improve the following deficits and impairments:  Abnormal gait,Decreased endurance,Decreased activity tolerance,Decreased strength,Decreased balance,Decreased mobility,Difficulty walking,Decreased range of motion,Decreased coordination  Visit Diagnosis: Unsteadiness on feet  Muscle weakness (generalized)  DM type 2 with diabetic peripheral neuropathy [facility])     Problem List Patient Active Problem List   Diagnosis Date Noted  . DM type 2 with diabetic peripheral neuropathy (HCC) 08/02/2020  . Gait abnormality 08/02/2020  . Gout   . Hypertension   . Type 2 diabetes mellitus (HCC) 02/18/2019  . AKI (acute kidney injury) (HCC) 02/18/2019  . Hyperkalemia 02/18/2019  . Metabolic acidosis 02/18/2019  . Hypoglycemia 02/17/2019  . Long term current use of oral hypoglycemic drug 05/23/2017  . Presbyopia of both eyes 05/23/2017  . Vitreous floaters of both eyes 05/23/2017  . Keratoconjunctivitis sicca of both eyes not specified as Sjogren's 10/16/2016  . Nuclear sclerotic cataract of both eyes 10/16/2016    Hildred Laser PT 08/09/2020, 4:43 PM  [facility] [address] [place], [place], [address] Phone: [phone]   Fax:  [phone]  Name: Mariah Mason MRN: [id number] Date of Birth: 11/18/1950

## 2020-08-16 ENCOUNTER — Encounter (HOSPITAL_BASED_OUTPATIENT_CLINIC_OR_DEPARTMENT_OTHER): Payer: Self-pay | Admitting: *Deleted

## 2020-08-16 ENCOUNTER — Emergency Department (HOSPITAL_BASED_OUTPATIENT_CLINIC_OR_DEPARTMENT_OTHER): Payer: Medicare Other

## 2020-08-16 ENCOUNTER — Emergency Department (HOSPITAL_BASED_OUTPATIENT_CLINIC_OR_DEPARTMENT_OTHER)
Admission: EM | Admit: 2020-08-16 | Discharge: 2020-08-16 | Disposition: A | Discharge status: Home / Self Care | Payer: Medicare Other | Attending: Emergency Medicine | Admitting: Emergency Medicine

## 2020-08-16 ENCOUNTER — Other Ambulatory Visit: Payer: Self-pay

## 2020-08-16 DIAGNOSIS — R42 Dizziness and giddiness: Secondary | ICD-10-CM

## 2020-08-16 DIAGNOSIS — I10 Essential (primary) hypertension: Secondary | ICD-10-CM | POA: Diagnosis not present

## 2020-08-16 DIAGNOSIS — Z79899 Other long term (current) drug therapy: Secondary | ICD-10-CM | POA: Insufficient documentation

## 2020-08-16 DIAGNOSIS — E1140 Type 2 diabetes mellitus with diabetic neuropathy, unspecified: Secondary | ICD-10-CM | POA: Insufficient documentation

## 2020-08-16 DIAGNOSIS — R269 Unspecified abnormalities of gait and mobility: Secondary | ICD-10-CM | POA: Insufficient documentation

## 2020-08-16 LAB — COMPREHENSIVE METABOLIC PANEL
ALT: 15 U/L (ref 0–44)
AST: 15 U/L (ref 15–41)
Albumin: 4 g/dL (ref 3.5–5.0)
Alkaline Phosphatase: 63 U/L (ref 38–126)
Anion gap: 9 (ref 5–15)
BUN: 32 mg/dL — ABNORMAL HIGH (ref 8–23)
CO2: 22 mmol/L (ref 22–32)
Calcium: 9.1 mg/dL (ref 8.9–10.3)
Chloride: 104 mmol/L (ref 98–111)
Creatinine, Ser: 1.35 mg/dL — ABNORMAL HIGH (ref 0.44–1.00)
GFR, Estimated: 42 mL/min — ABNORMAL LOW (ref >60)
Glucose, Bld: 162 mg/dL — ABNORMAL HIGH (ref 70–99)
Potassium: 4.2 mmol/L (ref 3.5–5.1)
Sodium: 135 mmol/L (ref 135–145)
Total Bilirubin: 0.3 mg/dL (ref 0.3–1.2)
Total Protein: 7.8 g/dL (ref 6.5–8.1)

## 2020-08-16 LAB — CK: Total CK: 54 U/L (ref 38–234)

## 2020-08-16 LAB — CBC WITH DIFFERENTIAL/PLATELET
Abs Immature Granulocytes: 0.03 10*3/uL (ref 0.00–0.07)
Basophils Absolute: 0 10*3/uL (ref 0.0–0.1)
Basophils Relative: 0 %
Eosinophils Absolute: 0.2 10*3/uL (ref 0.0–0.5)
Eosinophils Relative: 2 %
HCT: 37 % (ref 36.0–46.0)
Hemoglobin: 12.2 g/dL (ref 12.0–15.0)
Immature Granulocytes: 0 %
Lymphocytes Relative: 21 %
Lymphs Abs: 1.7 10*3/uL (ref 0.7–4.0)
MCH: 27.5 pg (ref 26.0–34.0)
MCHC: 33 g/dL (ref 30.0–36.0)
MCV: 83.3 fL (ref 80.0–100.0)
Monocytes Absolute: 0.4 10*3/uL (ref 0.1–1.0)
Monocytes Relative: 6 %
Neutro Abs: 5.5 10*3/uL (ref 1.7–7.7)
Neutrophils Relative %: 71 %
Platelets: 240 10*3/uL (ref 150–400)
RBC: 4.44 MIL/uL (ref 3.87–5.11)
RDW: 13 % (ref 11.5–15.5)
WBC: 7.8 10*3/uL (ref 4.0–10.5)
nRBC: 0 % (ref 0.0–0.2)

## 2020-08-16 LAB — URINALYSIS, ROUTINE W REFLEX MICROSCOPIC
Bilirubin Urine: NEGATIVE
Glucose, UA: NEGATIVE mg/dL
Ketones, ur: NEGATIVE mg/dL
Leukocytes,Ua: NEGATIVE
Nitrite: NEGATIVE
Protein, ur: 30 mg/dL — AB
Specific Gravity, Urine: <1.005 — ABNORMAL LOW (ref 1.005–1.030)
pH: 6 (ref 5.0–8.0)

## 2020-08-16 LAB — CBG MONITORING, ED: Glucose-Capillary: 156 mg/dL — ABNORMAL HIGH (ref 70–99)

## 2020-08-16 LAB — URINALYSIS, MICROSCOPIC (REFLEX)
Squamous Epithelial / HPF: NONE SEEN (ref 0–5)
WBC, UA: NONE SEEN WBC/hpf (ref 0–5)

## 2020-08-16 NOTE — ED Provider Notes (Signed)
[facility] EMERGENCY DEPARTMENT Provider Note   CSN: [id number] Arrival date & time: 08/16/20  1331     History Chief Complaint  Patient presents with  . Dizziness    Mariah Mason is a 70 y.o. female presenting for evaluation of dizziness.  Patient states this prior to arrival she stood up from her chair when she felt "woozy."  She describes it as feeling off balance.  She immediately sat down, and symptoms got better.  Currently, she is without any wooziness or dizziness.  She also states earlier today she had some cramping in her lower legs.  This is not new for her, but today's episode was a little more intense than normal.  She is not currently having any pain.  She denies fever, chills, headache, vision changes, chest pain, shortness of breath, cough, nausea, vomiting, abdominal pain, urinary symptoms, normal bowel movements.  She denies recent medication changes.  She has had normal p.o. intake recently.  She denies sick contacts.  Additional history obtained from chart review.  Patient with a history of diabetes, hypertension, hyperlipidemia, peripheral neuropathy.  HPI     Past Medical History:  Diagnosis Date  . Diabetes mellitus without complication ([place])   . Gout   . Hyperlipemia   . Hypertension   . Neuropathy     Patient Active Problem List   Diagnosis Date Noted  . DM type 2 with diabetic peripheral neuropathy ([place]) 08/02/2020  . Gait abnormality 08/02/2020  . Gout   . Hypertension   . Type 2 diabetes mellitus ([place]) 02/18/2019  . AKI (acute kidney injury) ([place]) 02/18/2019  . Hyperkalemia 02/18/2019  . Metabolic acidosis 02/58/5277  . Hypoglycemia 02/17/2019  . Long term current use of oral hypoglycemic drug 05/23/2017  . Presbyopia of both eyes 05/23/2017  . Vitreous floaters of both eyes 05/23/2017  . Keratoconjunctivitis sicca of both eyes not specified as Sjogren's 10/16/2016  . Nuclear sclerotic cataract of both eyes 10/16/2016    Past  Surgical History:  Procedure Laterality Date  . THYROID SURGERY       OB History   No obstetric history on file.     Family History  Problem Relation Age of Onset  . Heart attack Mother   . Kidney failure Father     Social History   Tobacco Use  . Smoking status: Never Smoker  . Smokeless tobacco: Never Used  Vaping Use  . Vaping Use: Never used  Substance Use Topics  . Alcohol use: Never  . Drug use: Never    Home Medications Prior to Admission medications   Medication Sig Start Date End Date Taking? Authorizing Provider  allopurinol (ZYLOPRIM) 100 MG tablet Take 1 tablet (100 mg total) by mouth daily. 05/11/20  Yes Newlin, Charlane Ferretti, MD  amLODipine (NORVASC) 10 MG tablet TAKE 1 TABLET(10 MG) BY MOUTH DAILY 02/11/20  Yes Newlin, Enobong, MD  carvedilol (COREG) 25 MG tablet TAKE 1 TABLET(25 MG) BY MOUTH TWICE DAILY WITH A MEAL 05/11/20  Yes Newlin, Enobong, MD  cholecalciferol (VITAMIN D3) 25 MCG (1000 UT) tablet Take 1,000 Units by mouth daily.   Yes [provider]  colchicine 0.6 MG tablet TAKE 2 TABLETS(1.2 MG) BY MOUTH AT ONSET FOR GOUT. MAY REPEAT 1 TABLET 0.6 MG IN 2 HOURS IF SYMPTOMS PERSIST 06/23/20  Yes Newlin, Enobong, MD  cycloSPORINE (RESTASIS) 0.05 % ophthalmic emulsion Place 1 drop into both eyes 2 (two) times daily.  12/05/18  Yes [provider]  Multiple Vitamins-Minerals (CENTRUM  SILVER ADULT 50+ PO) Take 1 tablet by mouth daily.   Yes [provider]  sitaGLIPtin (JANUVIA) 50 MG tablet TAKE 1 TABLET(50 MG) BY MOUTH DAILY 05/11/20  Yes Charlott Rakes, MD  blood glucose meter kit and supplies KIT Dispense based on patient and insurance preference. Use up to four times daily as directed. (FOR ICD-9 250.00, 250.01). 02/20/19   Donne Hazel, MD  doxycycline (VIBRAMYCIN) 100 MG capsule Take 1 capsule (100 mg total) by mouth 2 (two) times daily. 04/03/20   Tacy Learn, PA-C  Elastic Bandages & Supports (MEDICAL COMPRESSION SOCKS) MISC  Compression socks for DM peripheral neuropathy and bilateral leg swelling 08/02/20   Marcial Pacas, MD  ezetimibe (ZETIA) 10 MG tablet Take 10 mg by mouth daily. 07/11/20   [provider]  hydrALAZINE (APRESOLINE) 25 MG tablet Take 25 mg by mouth 2 (two) times daily. 05/01/20   [provider]  [place]. Devices [place] Shower chair. Dx- Diabetes mellitus 08/12/19   Charlott Rakes, MD  NONFORMULARY OR COMPOUNDED ITEM Peripheral Neuropathy Cream: Bupivacaine 1%, Doxepin 3%, Gabapentin 6%, Pentoxifylline 3%, Topiramate 1% Order faxed to [facility] 10/05/19   [name], DPM  traMADol (ULTRAM) 50 MG tablet Take 50 mg by mouth at bedtime as needed. 06/02/19   [provider]    Allergies    Erythromycin  Review of Systems   Review of Systems  Musculoskeletal: Positive for myalgias.  Neurological: Positive for dizziness.  All other systems reviewed and are negative.   Physical Exam Updated Vital Signs BP (!) 157/80 (BP Location: Right Arm)   Pulse 80   Temp 98.7 F (37.1 C) (Oral)   Resp (!) 22   Ht 5' 2" (1.575 m)   Wt 96.4 kg   SpO2 100%   BMI 38.87 kg/m   Physical Exam Vitals and nursing note reviewed.  Constitutional:      General: She is not in acute distress.    Appearance: She is well-developed.     Comments: Resting in the bed in no acute distress  HENT:     Head: Normocephalic and atraumatic.  Eyes:     Extraocular Movements: Extraocular movements intact.     Conjunctiva/sclera: Conjunctivae normal.     Pupils: Pupils are equal, round, and reactive to light.     Comments: EOMI and PERRLA.  No nystagmus.  Cardiovascular:     Rate and Rhythm: Normal rate and regular rhythm.     Pulses: Normal pulses.  Pulmonary:     Effort: Pulmonary effort is normal. No respiratory distress.     Breath sounds: Normal breath sounds. No wheezing.  Abdominal:     General: There is no distension.     Palpations: Abdomen is soft. There is no mass.      Tenderness: There is no abdominal tenderness. There is no guarding or rebound.  Musculoskeletal:        General: Normal range of motion.     Cervical back: Normal range of motion and neck supple.  Skin:    General: Skin is warm and dry.     Capillary Refill: Capillary refill takes less than 2 seconds.  Neurological:     General: No focal deficit present.     Mental Status: She is alert and oriented to person, place, and time.     GCS: GCS eye subscore is 4. GCS verbal subscore is 5. GCS motor subscore is 6.     Cranial Nerves: Cranial nerves are  intact.     Sensory: Sensation is intact.     Motor: Motor function is intact.     Coordination: Coordination is intact.     Gait: Gait abnormal.     Comments: No acute neurologic deficits.  CN intact.  Nose finger intact.  Fine movement and coordination intact.  When ambulating, patient with abnormal gait, however per chart review and per patient, she has a baseline abnormal gait.  Gait is not ataxic, patient states it is not different from her normal.     ED Results / Procedures / Treatments   Labs (all labs ordered are listed, but only abnormal results are displayed) Labs Reviewed  URINALYSIS, ROUTINE W REFLEX MICROSCOPIC - Abnormal; Notable for the following components:      Result Value   Specific Gravity, Urine <1.005 (*)    Hgb urine dipstick TRACE (*)    Protein, ur 30 (*)    All other components within normal limits  COMPREHENSIVE METABOLIC PANEL - Abnormal; Notable for the following components:   Glucose, Bld 162 (*)    BUN 32 (*)    Creatinine, Ser 1.35 (*)    GFR, Estimated 42 (*)    All other components within normal limits  URINALYSIS, MICROSCOPIC (REFLEX) - Abnormal; Notable for the following components:   Bacteria, UA RARE (*)    All other components within normal limits  CBG MONITORING, ED - Abnormal; Notable for the following components:   Glucose-Capillary 156 (*)    All other components within normal limits  CBC  WITH DIFFERENTIAL/PLATELET  CK    EKG EKG Interpretation  Date/Time:  Tuesday August 16 2020 14:44:46 EDT Ventricular Rate:  78 PR Interval:  157 QRS Duration: 79 QT Interval:  388 QTC Calculation: 442 R Axis:   35 Text Interpretation: Sinus rhythm No significant change since last tracing Confirmed by Blanchie Dessert [phone]) on 08/16/2020 3:47:44 PM   Radiology CT Head Wo Contrast  Result Date: 08/16/2020 CLINICAL DATA:  Neuro deficit reported in a 70 year old female, cramping in feet and dizziness. EXAM: CT HEAD WITHOUT CONTRAST TECHNIQUE: Contiguous axial images were obtained from the base of the skull through the vertex without intravenous contrast. COMPARISON:  None FINDINGS: Brain: No evidence of acute infarction, hemorrhage, hydrocephalus, extra-axial collection or mass lesion/mass effect. Signs of generalized atrophy and patchy areas of low attenuation in deep and periventricular white matter. Vascular: No hyperdense vessel or unexpected calcification. Skull: Normal. Negative for fracture or focal lesion. Sinuses/Orbits: Visualized paranasal sinuses and orbits are unremarkable. Other: None IMPRESSION: 1. No acute intracranial abnormality. 2. Signs of generalized atrophy and patchy areas of low attenuation in deep and periventricular white matter, most commonly seen with chronic small vessel ischemic disease. Electronically Signed   By: Zetta Bills M.D.   On: 08/16/2020 15:57    Procedures Procedures   Medications Ordered in ED Medications - No data to display  ED Course  I have reviewed the triage vital signs and the nursing notes.  Pertinent labs & imaging results that were available during my care of the patient were reviewed by me and considered in my medical decision making (see chart for details).    MDM Rules/Calculators/A&P                          Patient presented for evaluation of dizziness.  On exam, patient peers nontoxic.  She reports to me that her  symptoms are completely resolved.  Symptoms were  present when she went from sitting to standing, likely with her status.  Also consider vertigo/positional dizziness.  No acute neurologic deficits noted on my exam.  Do not have infectious symptoms, this is likely not the cause.  However considering her age, will obtain labs, EKG, and CT head to ensure no acute abnormality including anemia, electrolyte abnormality, cardiac arrhythmia, or brain mass.  Labs interpreted by me, overall reassuring.  Creatinine at baseline.  EKG nonischemic, unchanged from previous.  CT head negative for acute findings.  Orthostatics negative.  Case discussed with attending, Dr. Maryan Rued evaluated the patient.  Per the conversation between attending and patient, patient states symptoms are worse with movement of her head, though she continues to report that her symptoms are resolved at this time.  As such, favor vertigo.  As she is asymptomatic, I do not believe she needs further imaging including MRI.  However, patient is noted to have slightly elevated blood pressures, does not her baseline.  Encourage close monitoring with PCP.  At this time, patient appears safe for discharge.  Return precautions given.  Patient states she understands and agrees to plan.   Final Clinical Impression(s) / ED Diagnoses Final diagnoses:  Dizziness    Rx / DC Orders ED Discharge Orders    None       Franchot Heidelberg, PA-C 08/16/20 1648    Blanchie Dessert, MD 08/16/20 2337

## 2020-08-16 NOTE — ED Notes (Signed)
Pt lying down for orthostatics . RN Maggie informed

## 2020-08-16 NOTE — Discharge Instructions (Signed)
Continue taking home medications as prescribed. Make sure you are staying well-hydrated with water. Follow-up with your primary care doctor for recheck of your symptoms and for further evaluation of your intermittent muscle cramps. Return to the emergency room if you develop persistent dizziness, severe worsening pain, fevers, chest pain, or any new, worsening, or concerning symptoms.

## 2020-08-16 NOTE — ED Triage Notes (Signed)
Dizziness today while sitting in her chair. Cramping in her left foot. Tightness in her right foot. She walks with a cane. She walked to the car using her cane.

## 2020-08-17 ENCOUNTER — Ambulatory Visit: Payer: Medicare Other

## 2020-08-18 ENCOUNTER — Other Ambulatory Visit: Payer: Self-pay | Admitting: General Surgery

## 2020-08-22 ENCOUNTER — Ambulatory Visit (INDEPENDENT_AMBULATORY_CARE_PROVIDER_SITE_OTHER): Payer: Medicare Other | Admitting: Neurology

## 2020-08-22 ENCOUNTER — Telehealth: Payer: Self-pay | Admitting: Neurology

## 2020-08-22 DIAGNOSIS — R202 Paresthesia of skin: Secondary | ICD-10-CM

## 2020-08-22 DIAGNOSIS — E1142 Type 2 diabetes mellitus with diabetic polyneuropathy: Secondary | ICD-10-CM

## 2020-08-22 DIAGNOSIS — M5416 Radiculopathy, lumbar region: Secondary | ICD-10-CM

## 2020-08-22 DIAGNOSIS — R269 Unspecified abnormalities of gait and mobility: Secondary | ICD-10-CM

## 2020-08-22 NOTE — Progress Notes (Signed)
No chief complaint on file.     ASSESSMENT AND PLAN  Mariah Mason is a 70 y.o. female   Diabetic peripheral neuropathy Gait abnormality    Long history of diabetes, length dependent sensory changes, evidence of diabetic peripheral neuropathy, A1c was 7.9, no other treatable etiology for peripheral neuropathy found  EMG nerve conduction study on August 22, 2020 showed evidence of chronic bilateral lumbosacral radiculopathy, moderate axonal sensory motor polyneuropathy  Continue physical therapy  Return in 6 months     DIAGNOSTIC DATA (LABS, IMAGING, TESTING) - I reviewed patient records, labs, notes, testing and imaging myself where available.  Laboratory evaluation: Elevated creatinine 1.3, GFR 42, A1c 7.2, October 2020 was 7.6,  Laboratory evaluation in April 2022: Normal C-reactive protein, A57, RPR, folic acid, CPK, TSH, ANA, CBC, CPK, A1c is elevated 7.9, ESR was mildly elevated 51, CMP showed elevated creatinine 1.7, glucose 176,  HISTORICAL  Mariah Mason, is a 70 year old female, seen in request by her podiatrist Dr. [name], and her primary care physician Dr. Margarita Rana, Mariah Mason, for evaluation of bilateral feet paresthesia, gait abnormality, initial evaluation was on August 02, 2020  I reviewed and summarized the referring note. PMHX. DM for 20 years Gout  HTN  Patient is a poor historian, suffered diabetes for many years, had a gradual onset of bilateral feet numbness tingling, needle prick, gradually getting worse since 2021, paresthesia become more persistent, reporting uncomfortable sensation, also began to involve bilateral fingertips, she denies significant pain,  She also noticed gradual onset worsening gait abnormality, she had a chronic bilateral knee pain, valgus knee, flatfeet, always walk was awkward posture, has much worsened since 2021,   Update August 22, 2020: She return for electrodiagnostic study today, which showed evidence of length  dependent moderate axonal sensorimotor polyneuropathy, in addition, there is also evidence of chronic bilateral lumbosacral radiculopathy  She denies significant pain, but continue have gait abnormality, will improve with physical therapy, she denies bowel bladder incontinence  Laboratory evaluation for treatable cause of peripheral neuropathy showed elevated A1c 7.9, rest of the laboratory evaluation showed no significant abnormalities.  REVIEW OF SYSTEMS: Full 14 system review of systems performed and notable only for as above All other review of systems were negative.  PHYSICAL EXAM   There were no vitals filed for this visit. Not recorded     There is no height or weight on file to calculate BMI.  PHYSICAL EXAMNIATION:  Gen: NAD, conversant, well nourised, well groomed         NEUROLOGICAL EXAM:  MENTAL STATUS: Speech/cognition: Awake, alert, oriented to history taking and casual conversation  CRANIAL NERVES: CN II: Visual fields are full to confrontation. Pupils are round equal and briskly reactive to light. CN III, IV, VI: extraocular movement are normal. No ptosis. CN V: Facial sensation is intact to light touch CN VII: Face is symmetric with normal eye closure  CN VIII: Hearing is normal to causal conversation. CN IX, X: Phonation is normal. CN XI: Head turning and shoulder shrug are intact  MOTOR: Mild bilateral toe extension, flexion weakness  REFLEXES: Reflexes are 1 and symmetric at the biceps, triceps, knees, and absent at ankles. Plantar responses are flexor.  SENSORY: Bilateral lower extremity pitting edema, length dependent decreased vibratory sensation, pinprick to mid shin level  COORDINATION: There is no trunk or limb dysmetria noted.  GAIT/STANCE: She needs push-up to get up from seated position, unsteady, bilateral valgus knee, flatfeet, could not stand up on tiptoe, heel  ALLERGIES: Allergies  Allergen Reactions  . Erythromycin Other (See  Comments)    Does not remember reaction.    HOME MEDICATIONS: Current Outpatient Medications  Medication Sig Dispense Refill  . allopurinol (ZYLOPRIM) 100 MG tablet Take 1 tablet (100 mg total) by mouth daily. 90 tablet 1  . amLODipine (NORVASC) 10 MG tablet TAKE 1 TABLET(10 MG) BY MOUTH DAILY 90 tablet 1  . blood glucose meter kit and supplies KIT Dispense based on patient and insurance preference. Use up to four times daily as directed. (FOR ICD-9 250.00, 250.01). 1 each 0  . carvedilol (COREG) 25 MG tablet TAKE 1 TABLET(25 MG) BY MOUTH TWICE DAILY WITH A MEAL 180 tablet 1  . cholecalciferol (VITAMIN D3) 25 MCG (1000 UT) tablet Take 1,000 Units by mouth daily.    . colchicine 0.6 MG tablet TAKE 2 TABLETS(1.2 MG) BY MOUTH AT ONSET FOR GOUT. MAY REPEAT 1 TABLET 0.6 MG IN 2 HOURS IF SYMPTOMS PERSIST 30 tablet 1  . cycloSPORINE (RESTASIS) 0.05 % ophthalmic emulsion Place 1 drop into both eyes 2 (two) times daily.     Marland Kitchen doxycycline (VIBRAMYCIN) 100 MG capsule Take 1 capsule (100 mg total) by mouth 2 (two) times daily. 20 capsule 0  . Elastic Bandages & Supports (MEDICAL COMPRESSION SOCKS) MISC Compression socks for DM peripheral neuropathy and bilateral leg swelling 2 each 3  . ezetimibe (ZETIA) 10 MG tablet Take 10 mg by mouth daily.    . hydrALAZINE (APRESOLINE) 25 MG tablet Take 25 mg by mouth 2 (two) times daily.    . Misc. Devices [place] Shower chair. Dx- Diabetes mellitus 1 each 0  . Multiple Vitamins-Minerals (CENTRUM SILVER ADULT 50+ PO) Take 1 tablet by mouth daily.    . NONFORMULARY OR COMPOUNDED ITEM Peripheral Neuropathy Cream: Bupivacaine 1%, Doxepin 3%, Gabapentin 6%, Pentoxifylline 3%, Topiramate 1% Order faxed to [facility] 1 each 3  . sitaGLIPtin (JANUVIA) 50 MG tablet TAKE 1 TABLET(50 MG) BY MOUTH DAILY 90 tablet 1  . traMADol (ULTRAM) 50 MG tablet Take 50 mg by mouth at bedtime as needed.     No current facility-administered medications for this visit.    PAST  MEDICAL HISTORY: Past Medical History:  Diagnosis Date  . Diabetes mellitus without complication ([place])   . Gout   . Hyperlipemia   . Hypertension   . Neuropathy     PAST SURGICAL HISTORY: Past Surgical History:  Procedure Laterality Date  . THYROID SURGERY      FAMILY HISTORY: Family History  Problem Relation Age of Onset  . Heart attack Mother   . Kidney failure Father     SOCIAL HISTORY: Social History   Socioeconomic History  . Marital status: Single    Spouse name: Not on file  . Number of children: 6  . Years of education: 9th grade  . Highest education level: Not on file  Occupational History  . Occupation: Retired  Tobacco Use  . Smoking status: Never Smoker  . Smokeless tobacco: Never Used  Vaping Use  . Vaping Use: Never used  Substance and Sexual Activity  . Alcohol use: Never  . Drug use: Never  . Sexual activity: Not on file  Other Topics Concern  . Not on file  Social History Narrative   Lives with family.   Right-handed.   No daily use of caffeine.   Social Determinants of Health   Financial Resource Strain: Not on file  Food Insecurity: Not on file  Transportation Needs: Not  on file  Physical Activity: Not on file  Stress: Not on file  Social Connections: Not on file  Intimate Partner Violence: Not on file      Marcial Pacas, M.D. Ph.D.  [facility] [address] [place], [place] [address] Ph: [phone] Fax: [phone]  CC:  Charlott Rakes, MD [place],  [place] [id number]  Charlott Rakes, MD

## 2020-08-22 NOTE — Procedures (Signed)
Full Name: Chanda Laperle Gender: Female MRN #: [id number] Date of Birth: 04-Apr-1951    Visit Date: 08/22/2020 08:06 Age: 70 Years Examining Physician: Levert Feinstein, MD  Referring Physician: Levert Feinstein, MD History: 70 year old female with poorly controlled diabetes, presenting with chronic onset bilateral feet paresthesia, gait abnormality  Summary of the test: Nerve conduction study: Bilateral sural, superficial peroneal, left ulnar sensory responses were absent.  Left radial sensory response was normal. Bilateral peroneal to EDB, left ulnar motor responses were normal. Bilateral tibial motor responses showed moderately decreased CMAP amplitude, with low normal range conduction velocity.  Bilateral tibial H reflex is worse mildly prolonged.  Electromyography: Selected needle examination of bilateral lower extremity muscles, left upper extremity muscles, bilateral lumbosacral paraspinal muscles were performed. There is evidence of chronic neuropathic changes involving bilateral lower extremity muscles, involving bilateral L4-5 S1 myotomes.  There is evidence of increased insertional activity at bilateral lumbosacral paraspinal muscles.  There was no evidence of significant abnormality at selected needle examination of left upper extremities.    Conclusion: This is an abnormal study.  There is electrodiagnostic evidence of moderate axonal sensorimotor polyneuropathy, in addition, there is also evidence of chronic bilateral lumbosacral radiculopathy.    ------------------------------- Levert Feinstein, M.D. PhD  [facility] [address], [address] [place], [place] [address] Tel: [phone] Fax: [phone]  Verbal informed consent was obtained from the patient, patient was informed of potential risk of procedure, including bruising, bleeding, hematoma formation, infection, muscle weakness, muscle pain, numbness, among others.        MNC    Nerve / Sites Muscle  Latency Ref. Amplitude Ref. Rel Amp Segments Distance Velocity Ref. Area    ms ms mV mV %  cm m/s m/s mVms  L Ulnar - ADM     Wrist ADM 2.7 ?3.3 8.9 ?6.0 100 Wrist - ADM 7   25.0     B.Elbow ADM 6.6  8.1  91.1 B.Elbow - Wrist 19 49 ?49 24.3     A.Elbow ADM 8.2  8.0  98.2 A.Elbow - B.Elbow 8 49 ?49 24.5  R Peroneal - EDB     Ankle EDB 3.4 ?6.5 4.1 ?2.0 100 Ankle - EDB 9   13.5     Fib head EDB 8.3  2.4  58.9 Fib head - Ankle 26 53 ?44 10.1     Pop fossa EDB 10.4  2.2  91.3 Pop fossa - Fib head 10 49 ?44 9.9         Pop fossa - Ankle      L Peroneal - EDB     Ankle EDB 4.1 ?6.5 2.9 ?2.0 100 Ankle - EDB 9   9.8     Fib head EDB 10.1  2.2  75.7 Fib head - Ankle 27 44 ?44 6.7     Pop fossa EDB 12.4  2.1  96.5 Pop fossa - Fib head 10 44 ?44 6.1         Pop fossa - Ankle      R Tibial - AH     Ankle AH 5.6 ?5.8 2.3 ?4.0 100 Ankle - AH 9   6.1     Pop fossa AH 14.6  1.2  54.2 Pop fossa - Ankle 37 41 ?41 3.5  L Tibial - AH     Ankle AH 4.0 ?5.8 1.8 ?4.0 100 Ankle - AH 9   4.3     Pop fossa AH 13.3  1.3  70.5 Pop fossa - Ankle 38 41 ?41 4.4                     SNC    Nerve / Sites Rec. Site Peak Lat Ref.  Amp Ref. Segments Distance    ms ms V V  cm  L Radial - Anatomical snuff box (Forearm)     Forearm Wrist 2.2 ?2.9 15 ?15 Forearm - Wrist 10  R Sural - Ankle (Calf)     Calf Ankle NR ?4.4 NR ?6 Calf - Ankle 14  L Sural - Ankle (Calf)     Calf Ankle NR ?4.4 NR ?6 Calf - Ankle 14  R Superficial peroneal - Ankle     Lat leg Ankle NR ?4.4 NR ?6 Lat leg - Ankle 14  L Superficial peroneal - Ankle     Lat leg Ankle NR ?4.4 NR ?6 Lat leg - Ankle 14  L Ulnar - Orthodromic, (Dig V, Mid palm)     Dig V Wrist NR ?3.1 NR ?5 Dig V - Wrist 54                 F  Wave    Nerve F Lat Ref.   ms ms  R Tibial - AH 61.0 ?56.0  L Tibial - AH 63.0 ?56.0  L Ulnar - ADM 31.9 ?32.0           EMG Summary Table    Spontaneous MUAP Recruitment  Muscle IA Fib PSW Fasc Other Amp Dur. Poly Pattern   L. Tibialis anterior Increased None None None _______ Increased Increased 1+ Reduced  L. Tibialis posterior Increased None None None _______ Increased Increased 1+ Reduced  L. Gastrocnemius (Medial head) Increased None None None _______ Increased Increased 1+ Reduced  L. Vastus lateralis Increased None None None _______ Increased Increased 1+ Reduced  L. Biceps femoris (long head) Increased None None None _______ Increased Increased 1+ Reduced  L. Biceps femoris (short head) Increased None None None _______ Increased Increased 1+ Reduced  L. Gluteus medius Increased None None None _______ Increased Increased 1+ Reduced  L. Lumbar paraspinals (mid) Increased 1+ 1+ None _______ Normal Normal Normal Normal  L. Lumbar paraspinals (low) Increased 1+ 1+ None _______ Normal Normal Normal Normal  R. Tibialis anterior Increased None None None _______ Increased Increased 1+ Reduced  R. Tibialis posterior Increased None None None _______ Increased Increased 1+ Reduced  R. Peroneus longus Increased None None None _______ Increased Increased 1+ Reduced  R. Gastrocnemius (Medial head) Increased None None None _______ Increased Increased 1+ Reduced  R. Vastus lateralis Increased None None None _______ Increased Increased 1+ Reduced  R. Lumbar paraspinals (mid) Increased 1+ 1+ None _______ Normal Normal Normal Normal  R. Lumbar paraspinals (low) Increased 1+ 1+ None _______ Normal Normal Normal Normal  L. First dorsal interosseous Normal None None None _______ Normal Normal Normal Normal  L. Pronator teres Normal None None None _______ Normal Normal Normal Normal  L. Extensor digitorum communis Normal None None None _______ Normal Normal Normal Normal  L. Biceps brachii Normal None None None _______ Normal Normal Normal Normal  L. Deltoid Normal None None None _______ Normal Normal Normal Normal

## 2020-08-22 NOTE — Telephone Encounter (Signed)
Medicare/medicaid order sent to [facility]. No auth they will reach out to the patient to schedule.  °

## 2020-08-23 ENCOUNTER — Encounter (HOSPITAL_BASED_OUTPATIENT_CLINIC_OR_DEPARTMENT_OTHER): Payer: Self-pay | Admitting: General Surgery

## 2020-08-23 ENCOUNTER — Ambulatory Visit: Payer: Medicare Other | Admitting: Neurology

## 2020-08-23 ENCOUNTER — Other Ambulatory Visit: Payer: Self-pay

## 2020-08-24 ENCOUNTER — Ambulatory Visit: Payer: Medicare Other

## 2020-08-25 ENCOUNTER — Ambulatory Visit: Payer: Medicare Other | Admitting: Family Medicine

## 2020-08-25 ENCOUNTER — Other Ambulatory Visit: Payer: Self-pay

## 2020-08-25 ENCOUNTER — Ambulatory Visit: Payer: Medicare Other | Admitting: Physician Assistant

## 2020-08-25 VITALS — BP 168/74 | HR 78 | Temp 98.2°F | Resp 18 | Ht 64.0 in | Wt 212.0 lb

## 2020-08-25 DIAGNOSIS — E1142 Type 2 diabetes mellitus with diabetic polyneuropathy: Secondary | ICD-10-CM | POA: Diagnosis not present

## 2020-08-25 DIAGNOSIS — I10 Essential (primary) hypertension: Secondary | ICD-10-CM | POA: Diagnosis not present

## 2020-08-25 MED ORDER — AMLODIPINE BESYLATE 10 MG PO TABS: ORAL_TABLET | ORAL | 1 refills | Status: DC

## 2020-08-25 NOTE — Patient Instructions (Addendum)
For your elevated blood pressure, I encourage you to check your blood pressure at home 1-2 times a day, keep a written log and have available for all office visits.  If your blood pressure readings continue to remain elevated, please feel free to return to the mobile medicine unit or follow-up with your primary care provider for further evaluation.  For your diabetes, I do encourage you to reduce the amount of carbohydrates that you are consuming on a daily basis, I encourage you to keep the wheat bread, crackers and potato chips to it every once in a while treat.  Please let us know if there is anything else we can do for you  Roney Jaffe, PA-C Physician Assistant [facility] [email or web address]       How to Take Your Blood Pressure Blood pressure is a measurement of how strongly your blood is pressing against the walls of your arteries. Arteries are blood vessels that carry blood from your heart throughout your body. Your health care provider takes your blood pressure at each office visit. You can also take your own blood pressure at home with a blood pressure monitor. You may need to take your own blood pressure to:  Confirm a diagnosis of high blood pressure (hypertension).  Monitor your blood pressure over time.  Make sure your blood pressure medicine is working. Supplies needed:  Blood pressure monitor.  Dining room chair to sit in.  Table or desk.  Small notebook and pencil or pen. How to prepare To get the most accurate reading, avoid the following for 30 minutes before you check your blood pressure:  Drinking caffeine.  Drinking alcohol.  Eating.  Smoking.  Exercising. Five minutes before you check your blood pressure:  Use the bathroom and urinate so that you have an empty bladder.  Sit quietly in a dining room chair. Do not sit in a soft couch or an armchair. Do not talk. How to take your blood  pressure To check your blood pressure, follow the instructions in the manual that came with your blood pressure monitor. If you have a digital blood pressure monitor, the instructions may be as follows: 1. Sit up straight in a chair. 2. Place your feet on the floor. Do not cross your ankles or legs. 3. Rest your left arm at the level of your heart on a table or desk or on the arm of a chair. 4. Pull up your shirt sleeve. 5. Wrap the blood pressure cuff around the upper part of your left arm, 1 inch (2.5 cm) above your elbow. It is best to wrap the cuff around bare skin. 6. Fit the cuff snugly around your arm. You should be able to place only one finger between the cuff and your arm. 7. Position the cord so that it rests in the bend of your elbow. 8. Press the power button. 9. Sit quietly while the cuff inflates and deflates. 10. Read the digital reading on the monitor screen and write the numbers down (record them) in a notebook. 11. Wait 2-3 minutes, then repeat the steps, starting at step 1.   What does my blood pressure reading mean? A blood pressure reading consists of a higher number over a lower number. Ideally, your blood pressure should be below 120/80. The first ("top") number is called the systolic pressure. It is a measure of the pressure in your arteries as your heart beats. The second ("bottom") number is called the diastolic pressure. It is a  measure of the pressure in your arteries as the heart relaxes. Blood pressure is classified into five stages. The following are the stages for adults who do not have a short-term serious illness or a chronic condition. Systolic pressure and diastolic pressure are measured in a unit called mm Hg (millimeters of mercury).  Normal  Systolic pressure: below 120.  Diastolic pressure: below 80. Elevated  Systolic pressure: 120-129.  Diastolic pressure: below 80. Hypertension stage 1  Systolic pressure: 130-139.  Diastolic pressure:  80-89. Hypertension stage 2  Systolic pressure: 140 or above.  Diastolic pressure: 90 or above. You can have elevated blood pressure or hypertension even if only the systolic or only the diastolic number in your reading is higher than normal. Follow these instructions at home:  Check your blood pressure as often as recommended by your health care provider.  Check your blood pressure at the same time every day.  Take your monitor to the next appointment with your health care provider to make sure that: ? You are using it correctly. ? It provides accurate readings.  Be sure you understand what your goal blood pressure numbers are.  Tell your health care provider if you are having any side effects from blood pressure medicine.  Keep all follow-up visits as told by your health care provider. This is important. General tips  Your health care provider can suggest a reliable monitor that will meet your needs. There are several types of home blood pressure monitors.  Choose a monitor that has an arm cuff. Do not choose a monitor that measures your blood pressure from your wrist or finger.  Choose a cuff that wraps snugly around your upper arm. You should be able to fit only one finger between your arm and the cuff.  You can buy a blood pressure monitor at most drugstores or online. Where to find more information American Heart Association: www.heart.org Contact a health care provider if:  Your blood pressure is consistently high. Get help right away if:  Your systolic blood pressure is higher than 180.  Your diastolic blood pressure is higher than 120. Summary  Blood pressure is a measurement of how strongly your blood is pressing against the walls of your arteries.  A blood pressure reading consists of a higher number over a lower number. Ideally, your blood pressure should be below 120/80.  Check your blood pressure at the same time every day.  Avoid caffeine, alcohol,  smoking, and exercise for 30 minutes prior to checking your blood pressure. These agents can affect the accuracy of the blood pressure reading. This information is not intended to replace advice given to you by your health care provider. Make sure you discuss any questions you have with your health care provider. Document Revised: 04/10/2019 Document Reviewed: 04/10/2019 Elsevier Patient Education  2021 Elsevier Inc.   https://www.diabeteseducator.org/docs/default-source/living-with-diabetes/conquering-the-grocery-store-v1.pdf?sfvrsn=4">  Carbohydrate Counting for Diabetes Mellitus, Adult Carbohydrate counting is a method of keeping track of how many carbohydrates you eat. Eating carbohydrates naturally increases the amount of sugar (glucose) in the blood. Counting how many carbohydrates you eat improves your blood glucose control, which helps you manage your diabetes. It is important to know how many carbohydrates you can safely have in each meal. This is different for every person. A dietitian can help you make a meal plan and calculate how many carbohydrates you should have at each meal and snack. What foods contain carbohydrates? Carbohydrates are found in the following foods:  Grains, such as breads and cereals.  Dried beans and soy products.  Starchy vegetables, such as potatoes, peas, and corn.  Fruit and fruit juices.  Milk and yogurt.  Sweets and snack foods, such as cake, cookies, candy, chips, and soft drinks.   How do I count carbohydrates in foods? There are two ways to count carbohydrates in food. You can read food labels or learn standard serving sizes of foods. You can use either of the methods or a combination of both. Using the Nutrition Facts label The Nutrition Facts list is included on the labels of almost all packaged foods and beverages in the [place] It includes:  The serving size.  Information about nutrients in each serving, including the grams (g) of  carbohydrate per serving. To use the Nutrition Facts:  Decide how many servings you will have.  Multiply the number of servings by the number of carbohydrates per serving.  The resulting number is the total amount of carbohydrates that you will be having. Learning the standard serving sizes of foods When you eat carbohydrate foods that are not packaged or do not include Nutrition Facts on the label, you need to measure the servings in order to count the amount of carbohydrates.  Measure the foods that you will eat with a food scale or measuring cup, if needed.  Decide how many standard-size servings you will eat.  Multiply the number of servings by 15. For foods that contain carbohydrates, one serving equals 15 g of carbohydrates. ? For example, if you eat 2 cups or 10 oz (300 g) of strawberries, you will have eaten 2 servings and 30 g of carbohydrates (2 servings x 15 g = 30 g).  For foods that have more than one food mixed, such as soups and casseroles, you must count the carbohydrates in each food that is included. The following list contains standard serving sizes of common carbohydrate-rich foods. Each of these servings has about 15 g of carbohydrates:  1 slice of bread.  1 six-inch (15 cm) tortilla.  ? cup or 2 oz (53 g) cooked rice or pasta.   cup or 3 oz (85 g) cooked or canned, drained and rinsed beans or lentils.   cup or 3 oz (85 g) starchy vegetable, such as peas, corn, or squash.   cup or 4 oz (120 g) hot cereal.   cup or 3 oz (85 g) boiled or mashed potatoes, or  or 3 oz (85 g) of a large baked potato.   cup or 4 fl oz (118 mL) fruit juice.  1 cup or 8 fl oz (237 mL) milk.  1 small or 4 oz (106 g) apple.   or 2 oz (63 g) of a medium banana.  1 cup or 5 oz (150 g) strawberries.  3 cups or 1 oz (24 g) popped popcorn. What is an example of carbohydrate counting? To calculate the number of carbohydrates in this sample meal, follow the steps shown  below. Sample meal  3 oz (85 g) chicken breast.  ? cup or 4 oz (106 g) brown rice.   cup or 3 oz (85 g) corn.  1 cup or 8 fl oz (237 mL) milk.  1 cup or 5 oz (150 g) strawberries with sugar-free whipped topping. Carbohydrate calculation 1. Identify the foods that contain carbohydrates: ? Rice. ? Corn. ? Milk. ? Strawberries. 2. Calculate how many servings you have of each food: ? 2 servings rice. ? 1 serving corn. ? 1 serving milk. ? 1 serving strawberries. 3.  Multiply each number of servings by 15 g: ? 2 servings rice x 15 g = 30 g. ? 1 serving corn x 15 g = 15 g. ? 1 serving milk x 15 g = 15 g. ? 1 serving strawberries x 15 g = 15 g. 4. Add together all of the amounts to find the total grams of carbohydrates eaten: ? 30 g + 15 g + 15 g + 15 g = 75 g of carbohydrates total. What are tips for following this plan? Shopping  Develop a meal plan and then make a shopping list.  Buy fresh and frozen vegetables, fresh and frozen fruit, dairy, eggs, beans, lentils, and whole grains.  Look at food labels. Choose foods that have more fiber and less sugar.  Avoid processed foods and foods with added sugars. Meal planning  Aim to have the same amount of carbohydrates at each meal and for each snack time.  Plan to have regular, balanced meals and snacks. Where to find more information  American Diabetes Association: www.diabetes.org  Centers for Disease Control and Prevention: [employer]Ex[employer].com.brwww.cdc.gov Summary  Carbohydrate counting is a method of keeping track of how many carbohydrates you eat.  Eating carbohydrates naturally increases the amount of sugar (glucose) in the blood.  Counting how many carbohydrates you eat improves your blood glucose control, which helps you manage your diabetes.  A dietitian can help you make a meal plan and calculate how many carbohydrates you should have at each meal and snack. This information is not intended to replace advice given to you by your  health care provider. Make sure you discuss any questions you have with your health care provider. Document Revised: 04/16/2019 Document Reviewed: 04/17/2019 Elsevier Patient Education  2021 [employer]

## 2020-08-25 NOTE — Progress Notes (Signed)
Established Patient Office Visit  Subjective:  Patient ID: Mariah Mason, female    DOB: 09-01-1950  Age: 70 y.o. MRN: [id number]  CC:  Chief Complaint  Patient presents with  . Hypertension    HPI Mariah Mason presents for medication refills.  Reports that she does check blood pressure at home on occasion, however she is unable to report any blood pressure readings.  Denies any hypertensive symptoms.  Reports that she is taking fish oil on a daily basis, states that she is following a low-cholesterol diet overall.  States that she does check her blood glucose levels at home, however she is unable to report any blood glucose readings.  States that she does not track her carbohydrates, states that she does eat wheat bread, potato chips and crackers on an almost daily basis.  Daughter is present and does help with some of the history.   Past Medical History:  Diagnosis Date  . Diabetes mellitus without complication ([place])   . Gout   . Hyperlipemia   . Hypertension   . Neuropathy     Past Surgical History:  Procedure Laterality Date  . THYROID SURGERY      Family History  Problem Relation Age of Onset  . Heart attack Mother   . Kidney failure Father     Social History   Socioeconomic History  . Marital status: Single    Spouse name: Not on file  . Number of children: 6  . Years of education: 9th grade  . Highest education level: Not on file  Occupational History  . Occupation: Retired  Tobacco Use  . Smoking status: Never Smoker  . Smokeless tobacco: Never Used  Vaping Use  . Vaping Use: Never used  Substance and Sexual Activity  . Alcohol use: Never  . Drug use: Never  . Sexual activity: Not on file  Other Topics Concern  . Not on file  Social History Narrative   Lives with family.   Right-handed.   No daily use of caffeine.   Social Determinants of Health   Financial Resource Strain: Not on file  Food Insecurity: Not on file  Transportation  Needs: Not on file  Physical Activity: Not on file  Stress: Not on file  Social Connections: Not on file  Intimate Partner Violence: Not on file    Outpatient Medications Prior to Visit  Medication Sig Dispense Refill  . allopurinol (ZYLOPRIM) 100 MG tablet Take 1 tablet (100 mg total) by mouth daily. 90 tablet 1  . blood glucose meter kit and supplies KIT Dispense based on patient and insurance preference. Use up to four times daily as directed. (FOR ICD-9 250.00, 250.01). 1 each 0  . carvedilol (COREG) 25 MG tablet TAKE 1 TABLET(25 MG) BY MOUTH TWICE DAILY WITH A MEAL 180 tablet 1  . cholecalciferol (VITAMIN D3) 25 MCG (1000 UT) tablet Take 1,000 Units by mouth daily.    . colchicine 0.6 MG tablet TAKE 2 TABLETS(1.2 MG) BY MOUTH AT ONSET FOR GOUT. MAY REPEAT 1 TABLET 0.6 MG IN 2 HOURS IF SYMPTOMS PERSIST 30 tablet 1  . cycloSPORINE (RESTASIS) 0.05 % ophthalmic emulsion Place 1 drop into both eyes 2 (two) times daily.     Water engineer Bandages & Supports (MEDICAL COMPRESSION SOCKS) MISC Compression socks for DM peripheral neuropathy and bilateral leg swelling 2 each 3  . ezetimibe (ZETIA) 10 MG tablet Take 10 mg by mouth daily. (Patient not taking: Reported on 08/23/2020)    . hydrALAZINE (  APRESOLINE) 25 MG tablet Take 25 mg by mouth 2 (two) times daily. (Patient not taking: Reported on 08/23/2020)    . Misc. Devices [place] Shower chair. Dx- Diabetes mellitus 1 each 0  . Multiple Vitamins-Minerals (CENTRUM SILVER ADULT 50+ PO) Take 1 tablet by mouth daily.    . NONFORMULARY OR COMPOUNDED ITEM Peripheral Neuropathy Cream: Bupivacaine 1%, Doxepin 3%, Gabapentin 6%, Pentoxifylline 3%, Topiramate 1% Order faxed to [facility] 1 each 3  . Omega-3 Fatty Acids (FISH OIL) 1200 MG CAPS Take by mouth.    . sitaGLIPtin (JANUVIA) 50 MG tablet TAKE 1 TABLET(50 MG) BY MOUTH DAILY 90 tablet 1  . amLODipine (NORVASC) 10 MG tablet TAKE 1 TABLET(10 MG) BY MOUTH DAILY 90 tablet 1   No facility-administered  medications prior to visit.    Allergies  Allergen Reactions  . Erythromycin Other (See Comments)    Does not remember reaction.    ROS Review of Systems  Constitutional: Negative for chills and fever.  HENT: Negative.   Eyes: Negative.   Respiratory: Negative for shortness of breath.   Cardiovascular: Negative for chest pain.  Gastrointestinal: Negative.   Endocrine: Negative.   Genitourinary: Negative.   Musculoskeletal: Negative.   Skin: Negative.   Allergic/Immunologic: Negative.   Neurological: Negative.   Hematological: Negative.   Psychiatric/Behavioral: Negative.       Objective:    Physical Exam Vitals and nursing note reviewed.  Constitutional:      Appearance: Normal appearance. She is obese.  HENT:     Head: Normocephalic and atraumatic.     Right Ear: External ear normal.     Left Ear: External ear normal.     Nose: Nose normal.     Mouth/Throat:     Mouth: Mucous membranes are moist.     Pharynx: Oropharynx is clear.  Eyes:     Extraocular Movements: Extraocular movements intact.     Conjunctiva/sclera: Conjunctivae normal.     Pupils: Pupils are equal, round, and reactive to light.  Cardiovascular:     Rate and Rhythm: Normal rate and regular rhythm.     Pulses: Normal pulses.     Heart sounds: Normal heart sounds.  Pulmonary:     Effort: Pulmonary effort is normal.     Breath sounds: Normal breath sounds.  Musculoskeletal:        General: Normal range of motion.     Cervical back: Normal range of motion and neck supple.  Skin:    General: Skin is warm and dry.  Neurological:     General: No focal deficit present.     Mental Status: She is alert and oriented to person, place, and time.  Psychiatric:        Mood and Affect: Mood normal.        Behavior: Behavior normal.        Thought Content: Thought content normal.        Judgment: Judgment normal.     BP (!) 168/74 (BP Location: Left Arm, Patient Position: Sitting, Cuff Size:  Normal)   Pulse 78   Temp 98.2 F (36.8 C) (Oral)   Resp 18   Ht 5' 4" (1.626 m)   Wt 212 lb (96.2 kg)   SpO2 98%   BMI 36.39 kg/m  Wt Readings from Last 3 Encounters:  08/25/20 212 lb (96.2 kg)  08/16/20 212 lb 8.4 oz (96.4 kg)  08/02/20 212 lb 8 oz (96.4 kg)     Health Maintenance Due  Topic Date Due  . Hepatitis C Screening  Never done  . COVID-19 Vaccine (1) Never done  . OPHTHALMOLOGY EXAM  Never done  . TETANUS/TDAP  Never done  . PNA vac Low Risk Adult (2 of 2 - PPSV23) 08/11/2020    There are no preventive care reminders to display for this patient.  Lab Results  Component Value Date   TSH 3.760 08/02/2020   Lab Results  Component Value Date   WBC 7.8 08/16/2020   HGB 12.2 08/16/2020   HCT 37.0 08/16/2020   MCV 83.3 08/16/2020   PLT 240 08/16/2020   Lab Results  Component Value Date   NA 135 08/16/2020   K 4.2 08/16/2020   CO2 22 08/16/2020   GLUCOSE 162 (H) 08/16/2020   BUN 32 (H) 08/16/2020   CREATININE 1.35 (H) 08/16/2020   BILITOT 0.3 08/16/2020   ALKPHOS 63 08/16/2020   AST 15 08/16/2020   ALT 15 08/16/2020   PROT 7.8 08/16/2020   ALBUMIN 4.0 08/16/2020   CALCIUM 9.1 08/16/2020   ANIONGAP 9 08/16/2020   EGFR 32 (L) 08/02/2020   Lab Results  Component Value Date   CHOL 166 12/24/2019   Lab Results  Component Value Date   HDL 32 (L) 12/24/2019   Lab Results  Component Value Date   LDLCALC 101 (H) 12/24/2019   Lab Results  Component Value Date   TRIG 188 (H) 12/24/2019   Lab Results  Component Value Date   CHOLHDL 5.2 (H) 12/24/2019   Lab Results  Component Value Date   HGBA1C 7.9 (H) 08/02/2020      Assessment & Plan:   Problem List Items Addressed This Visit      Cardiovascular and Mediastinum   Hypertension   Relevant Medications   amLODipine (NORVASC) 10 MG tablet     Endocrine   DM type 2 with diabetic peripheral neuropathy ([place]) - Primary     1. DM type 2 with diabetic peripheral neuropathy [facility]) Patient  education given on reducing simple carbohydrates in diet.  Patient encouraged to check blood glucose levels on a daily basis, keep a written log and have available for all office visits  2. Primary hypertension Blood pressure is elevated at today's office visit, patient encouraged to check blood pressure at home on a daily basis, keep a written log and have available for all office visits.  Patient encouraged to return to the mobile medicine unit if blood pressures remain elevated, or follow-up with primary care provider.  Patient understands and agrees.  Red flags given for prompt reevaluation. - amLODipine (NORVASC) 10 MG tablet; TAKE 1 TABLET(10 MG) BY MOUTH DAILY  Dispense: 90 tablet; Refill: 1  Meds ordered this encounter  Medications  . amLODipine (NORVASC) 10 MG tablet    Sig: TAKE 1 TABLET(10 MG) BY MOUTH DAILY    Dispense:  90 tablet    Refill:  1    Order Specific Question:   Supervising Provider    Answer:   Asencion Noble E [[id number]]    I have reviewed the patient's medical history (PMH, PSH, Social History, Family History, Medications, and allergies) , and have been updated if relevant. I spent 30 minutes reviewing chart and  face to face time with patient.     Follow-up: Return if symptoms worsen or fail to improve.    Loraine Grip Mayers, PA-C

## 2020-08-25 NOTE — Progress Notes (Signed)
Patient verified DOB Patient has taken medication today. Patient has not eaten today. Patient denies pain at this time. 

## 2020-08-26 ENCOUNTER — Other Ambulatory Visit (HOSPITAL_COMMUNITY)
Admission: RE | Admit: 2020-08-26 | Discharge: 2020-08-26 | Disposition: A | Discharge status: Home / Self Care | Payer: Medicare Other | Source: Ambulatory Visit | Attending: General Surgery | Admitting: General Surgery

## 2020-08-26 DIAGNOSIS — Z20822 Contact with and (suspected) exposure to covid-19: Secondary | ICD-10-CM | POA: Diagnosis not present

## 2020-08-26 DIAGNOSIS — Z01812 Encounter for preprocedural laboratory examination: Secondary | ICD-10-CM | POA: Insufficient documentation

## 2020-08-26 LAB — SARS CORONAVIRUS 2 (TAT 6-24 HRS): SARS Coronavirus 2: NEGATIVE

## 2020-08-29 ENCOUNTER — Ambulatory Visit: Payer: Medicare Other

## 2020-08-29 NOTE — Anesthesia Preprocedure Evaluation (Addendum)
Anesthesia Evaluation  Patient identified by MRN, date of birth, ID band Patient awake    Reviewed: Allergy & Precautions, NPO status , Patient's Chart, lab work & pertinent test results, reviewed documented beta blocker date and time   History of Anesthesia Complications Negative for: history of anesthetic complications  Airway Mallampati: I  TM Distance: >3 FB Neck ROM: Full    Dental  (+) Edentulous Upper, Edentulous Lower, Dental Advisory Given   Pulmonary neg pulmonary ROS,    Pulmonary exam normal        Cardiovascular hypertension, Pt. on medications and Pt. on home beta blockers Normal cardiovascular exam     Neuro/Psych negative neurological ROS     GI/Hepatic negative GI ROS, Neg liver ROS,   Endo/Other  diabetes  Renal/GU Renal InsufficiencyRenal disease     Musculoskeletal negative musculoskeletal ROS (+)   Abdominal   Peds  Hematology negative hematology ROS (+)   Anesthesia Other Findings   Reproductive/Obstetrics                            Anesthesia Physical Anesthesia Plan  ASA: III  Anesthesia Plan: MAC   Post-op Pain Management:    Induction:   PONV Risk Score and Plan: 2 and Ondansetron and Propofol infusion  Airway Management Planned: Natural Airway  Additional Equipment:   Intra-op Plan:   Post-operative Plan:   Informed Consent: I have reviewed the patients History and Physical, chart, labs and discussed the procedure including the risks, benefits and alternatives for the proposed anesthesia with the patient or authorized representative who has indicated his/her understanding and acceptance.     Dental advisory given  Plan Discussed with: Anesthesiologist, CRNA and Surgeon  Anesthesia Plan Comments:        Anesthesia Quick Evaluation

## 2020-08-30 ENCOUNTER — Ambulatory Visit (HOSPITAL_BASED_OUTPATIENT_CLINIC_OR_DEPARTMENT_OTHER)
Admission: RE | Admit: 2020-08-30 | Discharge: 2020-08-30 | Disposition: A | Discharge status: Home / Self Care | Payer: Medicare Other | Attending: General Surgery | Admitting: General Surgery

## 2020-08-30 ENCOUNTER — Encounter (HOSPITAL_BASED_OUTPATIENT_CLINIC_OR_DEPARTMENT_OTHER): Payer: Self-pay | Admitting: General Surgery

## 2020-08-30 ENCOUNTER — Ambulatory Visit (HOSPITAL_BASED_OUTPATIENT_CLINIC_OR_DEPARTMENT_OTHER): Payer: Medicare Other | Admitting: Anesthesiology

## 2020-08-30 ENCOUNTER — Other Ambulatory Visit: Payer: Self-pay

## 2020-08-30 ENCOUNTER — Encounter (HOSPITAL_BASED_OUTPATIENT_CLINIC_OR_DEPARTMENT_OTHER)
Admission: RE | Disposition: A | Discharge status: Home / Self Care | Payer: Self-pay | Source: Home / Self Care | Attending: General Surgery

## 2020-08-30 DIAGNOSIS — Z79899 Other long term (current) drug therapy: Secondary | ICD-10-CM | POA: Insufficient documentation

## 2020-08-30 DIAGNOSIS — Z7984 Long term (current) use of oral hypoglycemic drugs: Secondary | ICD-10-CM | POA: Diagnosis not present

## 2020-08-30 DIAGNOSIS — L723 Sebaceous cyst: Secondary | ICD-10-CM | POA: Diagnosis present

## 2020-08-30 DIAGNOSIS — L720 Epidermal cyst: Secondary | ICD-10-CM | POA: Diagnosis not present

## 2020-08-30 DIAGNOSIS — Z881 Allergy status to other antibiotic agents status: Secondary | ICD-10-CM | POA: Diagnosis not present

## 2020-08-30 DIAGNOSIS — E119 Type 2 diabetes mellitus without complications: Secondary | ICD-10-CM | POA: Diagnosis not present

## 2020-08-30 DIAGNOSIS — I10 Essential (primary) hypertension: Secondary | ICD-10-CM | POA: Diagnosis not present

## 2020-08-30 HISTORY — PX: CYST EXCISION: SHX5701

## 2020-08-30 LAB — GLUCOSE, CAPILLARY
Glucose-Capillary: 205 mg/dL — ABNORMAL HIGH (ref 70–99)
Glucose-Capillary: 169 mg/dL — ABNORMAL HIGH (ref 70–99)

## 2020-08-30 SURGERY — CYST REMOVAL
Anesthesia: Monitor Anesthesia Care | Site: Back

## 2020-08-30 MED ORDER — FENTANYL CITRATE (PF) 100 MCG/2ML IJ SOLN: 25.0000 ug | INTRAMUSCULAR | Status: DC | PRN

## 2020-08-30 MED ORDER — BUPIVACAINE HCL (PF) 0.25 % IJ SOLN
INTRAMUSCULAR | Status: AC
  Filled 2020-08-30: qty 60

## 2020-08-30 MED ORDER — PROPOFOL 500 MG/50ML IV EMUL
INTRAVENOUS | Status: DC | PRN
  Administered 2020-08-30: 150 ug/kg/min via INTRAVENOUS

## 2020-08-30 MED ORDER — FENTANYL CITRATE (PF) 100 MCG/2ML IJ SOLN
INTRAMUSCULAR | Status: AC
  Filled 2020-08-30: qty 2

## 2020-08-30 MED ORDER — ENSURE PRE-SURGERY PO LIQD: 296.0000 mL | Freq: Once | ORAL | Status: DC

## 2020-08-30 MED ORDER — ONDANSETRON HCL 4 MG/2ML IJ SOLN
INTRAMUSCULAR | Status: DC | PRN
  Administered 2020-08-30: 4 mg via INTRAVENOUS

## 2020-08-30 MED ORDER — PROPOFOL 10 MG/ML IV BOLUS
INTRAVENOUS | Status: AC
  Filled 2020-08-30: qty 20

## 2020-08-30 MED ORDER — AMISULPRIDE (ANTIEMETIC) 5 MG/2ML IV SOLN
INTRAVENOUS | Status: AC
  Filled 2020-08-30: qty 4

## 2020-08-30 MED ORDER — ACETAMINOPHEN 500 MG PO TABS
ORAL_TABLET | ORAL | Status: AC
  Filled 2020-08-30: qty 2

## 2020-08-30 MED ORDER — ONDANSETRON HCL 4 MG/2ML IJ SOLN
INTRAMUSCULAR | Status: AC
  Filled 2020-08-30: qty 2

## 2020-08-30 MED ORDER — KETOROLAC TROMETHAMINE 30 MG/ML IJ SOLN
INTRAMUSCULAR | Status: AC
  Filled 2020-08-30: qty 1

## 2020-08-30 MED ORDER — PHENYLEPHRINE HCL (PRESSORS) 10 MG/ML IV SOLN
INTRAVENOUS | Status: DC | PRN
  Administered 2020-08-30 (×2): 120 ug via INTRAVENOUS
  Administered 2020-08-30: 200 ug via INTRAVENOUS

## 2020-08-30 MED ORDER — CEFAZOLIN SODIUM-DEXTROSE 2-4 GM/100ML-% IV SOLN
INTRAVENOUS | Status: AC
  Filled 2020-08-30: qty 100

## 2020-08-30 MED ORDER — FENTANYL CITRATE (PF) 100 MCG/2ML IJ SOLN
INTRAMUSCULAR | Status: DC | PRN
  Administered 2020-08-30: 50 ug via INTRAVENOUS

## 2020-08-30 MED ORDER — LIDOCAINE HCL 1 % IJ SOLN
INTRAMUSCULAR | Status: DC | PRN
  Administered 2020-08-30: 10 mL via INTRAMUSCULAR

## 2020-08-30 MED ORDER — LACTATED RINGERS IV SOLN: INTRAVENOUS | Status: DC

## 2020-08-30 MED ORDER — MIDAZOLAM HCL 2 MG/2ML IJ SOLN
INTRAMUSCULAR | Status: AC
  Filled 2020-08-30: qty 2

## 2020-08-30 MED ORDER — LIDOCAINE HCL (PF) 1 % IJ SOLN
INTRAMUSCULAR | Status: AC
  Filled 2020-08-30: qty 30

## 2020-08-30 MED ORDER — ACETAMINOPHEN 500 MG PO TABS
1000.0000 mg | ORAL_TABLET | ORAL | Status: AC
  Administered 2020-08-30: 1000 mg via ORAL

## 2020-08-30 MED ORDER — KETOROLAC TROMETHAMINE 15 MG/ML IJ SOLN
15.0000 mg | Freq: Four times a day (QID) | INTRAMUSCULAR | Status: DC | PRN
  Administered 2020-08-30: 15 mg via INTRAVENOUS

## 2020-08-30 MED ORDER — DEXAMETHASONE SODIUM PHOSPHATE 10 MG/ML IJ SOLN
INTRAMUSCULAR | Status: AC
  Filled 2020-08-30: qty 1

## 2020-08-30 MED ORDER — AMISULPRIDE (ANTIEMETIC) 5 MG/2ML IV SOLN
10.0000 mg | Freq: Once | INTRAVENOUS | Status: AC
  Administered 2020-08-30: 10 mg via INTRAVENOUS

## 2020-08-30 MED ORDER — CEFAZOLIN SODIUM-DEXTROSE 2-4 GM/100ML-% IV SOLN
2.0000 g | INTRAVENOUS | Status: AC
  Administered 2020-08-30: 2 g via INTRAVENOUS

## 2020-08-30 MED ORDER — LIDOCAINE 2% (20 MG/ML) 5 ML SYRINGE
INTRAMUSCULAR | Status: AC
  Filled 2020-08-30: qty 5

## 2020-08-30 MED ORDER — CELECOXIB 200 MG PO CAPS
ORAL_CAPSULE | ORAL | Status: AC
  Filled 2020-08-30: qty 1

## 2020-08-30 SURGICAL SUPPLY — 26 items
ADH SKN CLS APL DERMABOND .7 (GAUZE/BANDAGES/DRESSINGS) ×2
BLADE SURG 15 STRL LF DISP TIS (BLADE) ×1 IMPLANT
BLADE SURG 15 STRL SS (BLADE) ×2
COVER BACK TABLE 60X90IN (DRAPES) ×2 IMPLANT
COVER MAYO STAND STRL (DRAPES) ×2 IMPLANT
DERMABOND ADVANCED (GAUZE/BANDAGES/DRESSINGS) ×2
DERMABOND ADVANCED .7 DNX12 (GAUZE/BANDAGES/DRESSINGS) ×2 IMPLANT
DRAPE LAPAROTOMY 100X72 PEDS (DRAPES) ×2 IMPLANT
ELECT COATED BLADE 2.86 ST (ELECTRODE) ×2 IMPLANT
ELECT REM PT RETURN 9FT ADLT (ELECTROSURGICAL) ×2
ELECTRODE REM PT RTRN 9FT ADLT (ELECTROSURGICAL) ×1 IMPLANT
GLOVE SURG ENC MOIS LTX SZ6.5 (GLOVE) ×2 IMPLANT
GLOVE SURG ENC MOIS LTX SZ7 (GLOVE) ×2 IMPLANT
GLOVE SURG UNDER POLY LF SZ7 (GLOVE) ×4 IMPLANT
GLOVE SURG UNDER POLY LF SZ7.5 (GLOVE) ×2 IMPLANT
GOWN STRL REUS W/ TWL LRG LVL3 (GOWN DISPOSABLE) ×2 IMPLANT
GOWN STRL REUS W/TWL LRG LVL3 (GOWN DISPOSABLE) ×4
NEEDLE HYPO 25X1 1.5 SAFETY (NEEDLE) ×2 IMPLANT
PACK BASIN DAY SURGERY FS (CUSTOM PROCEDURE TRAY) ×2 IMPLANT
PENCIL SMOKE EVACUATOR (MISCELLANEOUS) ×2 IMPLANT
SUT MNCRL AB 4-0 PS2 18 (SUTURE) ×2 IMPLANT
SUT VIC AB 3-0 SH 27 (SUTURE) ×6
SUT VIC AB 3-0 SH 27X BRD (SUTURE) ×3 IMPLANT
SYR CONTROL 10ML LL (SYRINGE) ×2 IMPLANT
TOWEL GREEN STERILE FF (TOWEL DISPOSABLE) ×2 IMPLANT
TRAY DSU PREP LF (CUSTOM PROCEDURE TRAY) ×2 IMPLANT

## 2020-08-30 NOTE — Discharge Instructions (Signed)
[facility] POST OP INSTRUCTIONS  Always review your discharge instruction sheet given to you by the facility where your surgery was performed. IF YOU HAVE DISABILITY OR FAMILY LEAVE FORMS, YOU MUST BRING THEM TO THE OFFICE FOR PROCESSING.   DO NOT GIVE THEM TO YOUR DOCTOR.  1. A prescription for pain medication may be given to you upon discharge.  Take your pain medication as prescribed, if needed.  If narcotic pain medicine is not needed, then you may take acetaminophen (Tylenol), naprosyn (Alleve), or ibuprofen (Advil) as needed. 2. Take your usually prescribed medications unless otherwise directed. 3. If you need a refill on your pain medication, please contact your pharmacy.  They will contact our office to request authorization. Prescriptions will not be filled after 5pm or on week-ends. 4. Resume a regular diet.   Be sure to include lots of fluids daily. 5. Most patients will experience some swelling and bruising in the area of the incisions.  Ice packs will help.  Swelling and bruising can take several days to resolve.  6. It is common to experience some constipation if taking pain medication after surgery.  Increasing fluid intake and taking a stool softener (such as Colace) will usually help or prevent this problem from occurring.  A mild laxative (Milk of Magnesia or Miralax) should be taken according to package instructions if there are no bowel movements after 48 hours. 7. Unless discharge instructions indicate otherwise, you may remove your bandages 48 hours after surgery, and you may shower at that time.  You may have steri-strips (small skin tapes) in place directly over the incision.  These strips should be left on the skin for 7-10 days.  If your surgeon used skin glue on the incision, you may shower in 24 hours.  The glue will flake off over the next 2-3 weeks.  Any sutures or staples will be removed at the office during your  follow-up visit. 8. ACTIVITIES:  You may resume regular (light) daily activities beginning the next day--such as daily self-care, walking, climbing stairs--gradually increasing activities as tolerated.  You may have sexual intercourse when it is comfortable.  Refrain from any heavy lifting or straining until approved by your doctor. a. You may drive when you are no longer taking prescription pain medication, you can comfortably wear a seatbelt, and you can safely maneuver your car and apply brakes. b. RETURN TO WORK:  __________________________________________________________ 9. You should see your doctor in the office for a follow-up appointment approximately 2-3 weeks after your surgery.  Make sure that you call for this appointment within a day or two after you arrive home to insure a convenient appointment time. 10. OTHER INSTRUCTIONS: __________________________________________________________________________________________________________________________ __________________________________________________________________________________________________________________________ WHEN TO CALL YOUR DOCTOR: 1. Fever over 101.0 2. Inability to urinate 3. Continued bleeding from incision. 4. Increased pain, redness, or drainage from the incision. 5. Increasing abdominal pain  The clinic staff is available to answer your questions during regular business hours.  Please don't hesitate to call and ask to speak to one of the nurses for clinical concerns.  If you have a medical emergency, go to the nearest emergency room or call 911.  A surgeon from [facility] is always on call at the hospital. [address], [place], [place]  [address] ? [address] [address], [place], [place]   [address] [phone] ? [phone] ? FAX [phone] Web site: [email or web address]  No Tylenol until after 4pm today if needed  Post Anesthesia  Home Care Instructions  Activity: Get  plenty of rest for the remainder of the day. A responsible individual must stay with you for 24 hours following the procedure.  For the next 24 hours, DO NOT: -Drive a car -Advertising copywriter -Drink alcoholic beverages -Take any medication unless instructed by your physician -Make any legal decisions or sign important papers.  Meals: Start with liquid foods such as gelatin or soup. Progress to regular foods as tolerated. Avoid greasy, spicy, heavy foods. If nausea and/or vomiting occur, drink only clear liquids until the nausea and/or vomiting subsides. Call your physician if vomiting continues.  Special Instructions/Symptoms: Your throat may feel dry or sore from the anesthesia or the breathing tube placed in your throat during surgery. If this causes discomfort, gargle with warm salt water. The discomfort should disappear within 24 hours.  If you had a scopolamine patch placed behind your ear for the management of post- operative nausea and/or vomiting:  1. The medication in the patch is effective for 72 hours, after which it should be removed.  Wrap patch in a tissue and discard in the trash. Wash hands thoroughly with soap and water. 2. You may remove the patch earlier than 72 hours if you experience unpleasant side effects which may include dry mouth, dizziness or visual disturbances. 3. Avoid touching the patch. Wash your hands with soap and water after contact with the patch.

## 2020-08-30 NOTE — Op Note (Signed)
Preoperative diagnosis: right back sebaceous cyst, left neck sebaceous cyst Postoperative diagnosis: saa Procedure: 1. Excision of left neck 2x3 cm sebaceous cyst 2. Excision of right back 3x3 cm sebaceous cyst Dr Harden Mo EBL: minimal Anesthesia: local with iv sedation Complications none Drains none Specimens 1. Neck sebaceous cyst 2. Back sebaceous cyst Sponge and needle count correct dispo recovery stable  Indications: 60 yof with two sebaceous cysts she would like removed.  Procedure: After informed consent obtained patient was taken to the OR. She was placed under sedation.  She was placed in mild lateral position and prepped and draped in standard sterile surgical fashion.  She was given antibiotics.  A timeout was performed.  I first did the back one. I infiltrated local anesthetic and made an elliptical incision overlying the cyst to include the cyst connection.  I then removed the cyst in its entirety.  Hemostasis was observed. I closed this with 3-0 vicryl and 4-0 monocryl.  Glue and steristrips were applied.  We then positioned her again. We prepped and draped the left neck.  I infiltrated marcaine and made an elliptical incision. I removed the mass in its entirety. Hemostasis was observed. I closed this with 3-0 vicryl and 4-0 monocryl.  Glue and steristrips were applied. She tolerated well and was transferred to pacu stable.

## 2020-08-30 NOTE — H&P (Signed)
  69 yof who has dm/htn presents after a left neck abscess/cyst was drained in er. this area has healed. she would like to consider excision. it has not been infected since, no drainage. it does cause some pain. she also has a rapidly enlarging right upper back mass that is a sebaceous cyst- no history of infection but it bothers her. she is here with her daughter today  Past Surgical History No pertinent past surgical history   Allergies Erythromycin *DERMATOLOGICALS*   Medication History  Allopurinol (100MG  Tablet, Oral) Active. amLODIPine Besylate (10MG  Tablet, Oral) Active. Atorvastatin Calcium (20MG  Tablet, Oral) Active. Carvedilol (25MG  Tablet, Oral) Active. Colchicine (0.6MG  Tablet, Oral) Active. Doxycycline Hyclate (100MG  Capsule, Oral) Active. Ezetimibe (10MG  Tablet, Oral) Active. hydrALAZINE HCl (25MG  Tablet, Oral) Active. Restasis (0.05% Emulsion, Ophthalmic) Active. Januvia (50MG  Tablet, Oral) Active. Medications Reconciled  Social History  No alcohol use  No drug use  Tobacco use  Never smoker.  Family History Diabetes Mellitus  Mother. Heart Disease  Father. Heart disease in female family member before age 34  Hypertension  Daughter, Father, Mother, Son.  Other Problems Diabetes Mellitus  Hypercholesterolemia    Review of Systems  General Not Present- Appetite Loss, Chills, Fatigue, Fever, Night Sweats, Weight Gain and Weight Loss. Skin Not Present- Change in Wart/Mole, Dryness, Hives, Jaundice, New Lesions, Non-Healing Wounds, Rash and Ulcer. HEENT Not Present- Earache, Hearing Loss, Hoarseness, Nose Bleed, Oral Ulcers, Ringing in the Ears, Seasonal Allergies, Sinus Pain, Sore Throat, Visual Disturbances, Wears glasses/contact lenses and Yellow Eyes. Respiratory Not Present- Bloody sputum, Chronic Cough, Difficulty Breathing, Snoring and Wheezing. Breast Not Present- Breast Mass, Breast Pain, Nipple Discharge and Skin  Changes. Cardiovascular Not Present- Chest Pain, Difficulty Breathing Lying Down, Leg Cramps, Palpitations, Rapid Heart Rate, Shortness of Breath and Swelling of Extremities. Gastrointestinal Not Present- Abdominal Pain, Bloating, Bloody Stool, Change in Bowel Habits, Chronic diarrhea, Constipation, Difficulty Swallowing, Excessive gas, Gets full quickly at meals, Hemorrhoids, Indigestion, Nausea, Rectal Pain and Vomiting. Female Genitourinary Not Present- Frequency, Nocturia, Painful Urination, Pelvic Pain and Urgency. Musculoskeletal Not Present- Back Pain, Joint Pain, Joint Stiffness, Muscle Pain, Muscle Weakness and Swelling of Extremities. Neurological Present- Numbness and Tingling. Not Present- Decreased Memory, Fainting, Headaches, Seizures, Tremor, Trouble walking and Weakness. Psychiatric Not Present- Anxiety, Bipolar, Change in Sleep Pattern, Depression, Fearful and Frequent crying. Endocrine Not Present- Cold Intolerance, Excessive Hunger, Hair Changes, Heat Intolerance, Hot flashes and New Diabetes. Hematology Not Present- Blood Thinners, Easy Bruising, Excessive bleeding, Gland problems, HIV and Persistent Infections.   Physical Exam  General Mental Status-Alert. Orientation-Oriented X3. Head and Neck Note: left posterior neck upper cervical 1x2 cm sebaceous cyst, mobile noninfected Chest and Lung Exam Note: right upper back 2x2 cm sebaceous cyst, not infected mobile   Assessment & Plan  SEBACEOUS CYST (L72.3) Story: excision of sebaceous cyst times two I think for prior infection due to risk removing the neck eic is indicated. I also think if going to OR where will need to be done reasonable to also excise that back one as it is rapidly getting bigger and causing symptoms. will plan to do in or, discussed risks of infection and recurrence.

## 2020-08-30 NOTE — Anesthesia Postprocedure Evaluation (Signed)
Anesthesia Post Note  Patient: Mariah Mason  Procedure(s) Performed: EXCISION 1X2 cm LEFT NECK SEBACEOUS CYST EXCISION 2X2 CM RIGHT BACK SEBACEOUS CYST (N/A Back)     Patient location during evaluation: PACU Anesthesia Type: MAC Level of consciousness: awake and alert Pain management: pain level controlled Vital Signs Assessment: post-procedure vital signs reviewed and stable Respiratory status: spontaneous breathing and respiratory function stable Cardiovascular status: stable Postop Assessment: no apparent nausea or vomiting Anesthetic complications: no   No complications documented.  Last Vitals:  Vitals:   08/30/20 1106 08/30/20 1115  BP: (!) 93/51 (!) 105/55  Pulse: 66 62  Resp: (!) 8 13  Temp: 36.6 C   SpO2: 100% 100%    Last Pain:  Vitals:   08/30/20 1130  TempSrc:   PainSc: 0-No pain                 Damarys Speir DANIEL

## 2020-08-30 NOTE — Transfer of Care (Signed)
Immediate Anesthesia Transfer of Care Note  Patient: Mariah Mason  Procedure(s) Performed: EXCISION 1X2 cm LEFT NECK SEBACEOUS CYST EXCISION 2X2 CM RIGHT BACK SEBACEOUS CYST (N/A Back)  Patient Location: PACU  Anesthesia Type:MAC  Level of Consciousness: awake, alert  and oriented  Airway & Oxygen Therapy: Patient Spontanous Breathing and Patient connected to face mask oxygen  Post-op Assessment: Report given to RN and Post -op Vital signs reviewed and stable  Post vital signs: Reviewed and stable  Last Vitals:  Vitals Value Taken Time  BP    Temp    Pulse    Resp    SpO2      Last Pain:  Vitals:   08/30/20 0953  TempSrc: Oral  PainSc: 0-No pain         Complications: No complications documented.

## 2020-08-30 NOTE — Interval H&P Note (Signed)
History and Physical Interval Note:  08/30/2020 10:01 AM  Mariah Mason  has presented today for surgery, with the diagnosis of SEBACEOUS CYST.  The various methods of treatment have been discussed with the patient and family. After consideration of risks, benefits and other options for treatment, the patient has consented to  Procedure(s): EXCISION 1X2 LEFT NECK SEBACEOUS CYST EXCISION 2X2 CM RIGHT BACK SEBACEOUS CYST (N/A) as a surgical intervention.  The patient's history has been reviewed, patient examined, no change in status, stable for surgery.  I have reviewed the patient's chart and labs.  Questions were answered to the patient's satisfaction.     Emelia Loron

## 2020-08-31 ENCOUNTER — Encounter (HOSPITAL_BASED_OUTPATIENT_CLINIC_OR_DEPARTMENT_OTHER): Payer: Self-pay | Admitting: General Surgery

## 2020-09-01 LAB — SURGICAL PATHOLOGY

## 2020-09-07 ENCOUNTER — Ambulatory Visit: Payer: Medicare Other

## 2020-09-12 ENCOUNTER — Ambulatory Visit: Payer: Medicare Other

## 2020-09-13 ENCOUNTER — Ambulatory Visit
Admission: RE | Admit: 2020-09-13 | Discharge: 2020-09-13 | Disposition: A | Discharge status: Home / Self Care | Payer: Medicare Other | Source: Ambulatory Visit | Attending: Neurology | Admitting: Neurology

## 2020-09-13 DIAGNOSIS — R269 Unspecified abnormalities of gait and mobility: Secondary | ICD-10-CM

## 2020-09-13 DIAGNOSIS — M5416 Radiculopathy, lumbar region: Secondary | ICD-10-CM

## 2020-09-13 DIAGNOSIS — E1142 Type 2 diabetes mellitus with diabetic polyneuropathy: Secondary | ICD-10-CM

## 2020-09-15 ENCOUNTER — Telehealth: Payer: Self-pay | Admitting: Neurology

## 2020-09-15 NOTE — Telephone Encounter (Signed)
I spoke to the patient's daughter, Luna Kitchens (on [place]). She verbalized understanding of the findings below. She feels her mother's gait is better with an occasional bad day. Denies any bowel or bladder incontinence. She would like to keep the pending appt 03/16/21. If she notices any signs of concerning or worsening symptoms, she will call for an earlier follow up.

## 2020-09-15 NOTE — Telephone Encounter (Signed)
   IMPRESSION:   MRI lumbar spine without contrast imaging: - At T11-12: Disc bulging and facet and ligamentum flavum hypertrophy with moderate spinal stenosis and moderate bilateral foraminal stenosis; no cord signal abnormality. - Additional mild foraminal stenosis at L1-2, L2-3, L4-5 and L5-S1. - Sacralization of L5 with transitional features.  Correlate with plain film imaging if intervention / surgery is planned.   Please call patient, MRI of the lumbar spine showed multilevel degenerative changes, most severe at T11-12 level, evidence of moderate spinal stenosis,  If she still has slow worsening gait abnormality, or bowel and bladder incontinence, may bring her in earlier to review MRI together

## 2020-09-21 ENCOUNTER — Ambulatory Visit: Payer: Medicare Other

## 2020-09-28 ENCOUNTER — Ambulatory Visit: Payer: Medicare Other

## 2020-10-01 ENCOUNTER — Other Ambulatory Visit: Payer: Self-pay | Admitting: Family Medicine

## 2020-10-01 NOTE — Telephone Encounter (Signed)
Requested medication (s) are due for refill today: -  Requested medication (s) are on the active medication list: historical med  Last refill:  07/13/20  Future visit scheduled: yes  Notes to clinic:  historical provider and med   Requested Prescriptions  Pending Prescriptions Disp Refills   ezetimibe (ZETIA) 10 MG tablet [Pharmacy Med Name: EZETIMIBE 10MG  TABLETS] 90 tablet     Sig: TAKE 1 TABLET(10 MG) BY MOUTH DAILY      Cardiovascular:  Antilipid - Sterol Transport Inhibitors Failed - 10/01/2020  4:18 PM      Failed - LDL in normal range and within 360 days    LDL Chol Calc (NIH)  Date Value Ref Range Status  12/24/2019 101 (H) 0 - 99 mg/dL Final          Failed - HDL in normal range and within 360 days    HDL  Date Value Ref Range Status  12/24/2019 32 (L) >39 mg/dL Final          Failed - Triglycerides in normal range and within 360 days    Triglycerides  Date Value Ref Range Status  12/24/2019 188 (H) 0 - 149 mg/dL Final          Passed - Total Cholesterol in normal range and within 360 days    Cholesterol, Total  Date Value Ref Range Status  12/24/2019 166 100 - 199 mg/dL Final          Passed - Valid encounter within last 12 months    Recent Outpatient Visits           4 months ago Neck mass   [facility] [place], [place], [place]   7 months ago Type 2 diabetes mellitus with other neurologic complication, with long-term current use of insulin (HCC)   [facility] [place], [place], MD   10 months ago Type 2 diabetes mellitus with other specified complication, with long-term current use of insulin (HCC)   [facility] [place], [place], MD   1 year ago Type 2 diabetes mellitus with other specified complication, with long-term current use of insulin (HCC)   [facility] [place], [place], MD   1 year ago Type 2 diabetes mellitus with other specified  complication, with long-term current use of insulin (HCC)   [facility] [place], MD       Future Appointments             In 1 month Hoy Register, MD [facility]

## 2020-10-03 ENCOUNTER — Ambulatory Visit: Payer: Medicare Other

## 2020-10-09 ENCOUNTER — Other Ambulatory Visit: Payer: Self-pay | Admitting: Family Medicine

## 2020-10-09 DIAGNOSIS — M1A00X0 Idiopathic chronic gout, unspecified site, without tophus (tophi): Secondary | ICD-10-CM

## 2020-10-09 NOTE — Telephone Encounter (Signed)
Requested medication (s) are due for refill today: yes  Requested medication (s) are on the active medication list: yes  Last refill:  05/11/20  Future visit scheduled: yes  Notes to clinic:  overdue lab work   Requested Prescriptions  Pending Prescriptions Disp Refills   allopurinol (ZYLOPRIM) 100 MG tablet [Pharmacy Med Name: ALLOPURINOL 100MG  TABLETS] 90 tablet 1    Sig: TAKE 1 TABLET(100 MG) BY MOUTH DAILY      Endocrinology:  Gout Agents Failed - 10/09/2020  7:14 AM      Failed - Uric Acid in normal range and within 360 days    No results found for: POCURA, LABURIC        Failed - Cr in normal range and within 360 days    Creatinine, Ser  Date Value Ref Range Status  08/16/2020 1.35 (H) 0.44 - 1.00 mg/dL Final          Passed - Valid encounter within last 12 months    Recent Outpatient Visits           5 months ago Neck mass   [facility] [place], [place], MD   8 months ago Type 2 diabetes mellitus with other neurologic complication, with long-term current use of insulin (HCC)   [facility] [place], [place], MD   11 months ago Type 2 diabetes mellitus with other specified complication, with long-term current use of insulin (HCC)   [facility] [place], [place], MD   1 year ago Type 2 diabetes mellitus with other specified complication, with long-term current use of insulin (HCC)   [facility] [place], [place], MD   1 year ago Type 2 diabetes mellitus with other specified complication, with long-term current use of insulin (HCC)    [facility] [place], MD       Future Appointments             In 1 month Hoy Register, MD [facility]

## 2020-10-26 ENCOUNTER — Ambulatory Visit (INDEPENDENT_AMBULATORY_CARE_PROVIDER_SITE_OTHER): Payer: Medicare Other | Admitting: Podiatry

## 2020-10-26 ENCOUNTER — Encounter: Payer: Self-pay | Admitting: Podiatry

## 2020-10-26 ENCOUNTER — Other Ambulatory Visit: Payer: Self-pay

## 2020-10-26 DIAGNOSIS — B351 Tinea unguium: Secondary | ICD-10-CM | POA: Diagnosis not present

## 2020-10-26 DIAGNOSIS — M79609 Pain in unspecified limb: Secondary | ICD-10-CM | POA: Diagnosis not present

## 2020-10-26 DIAGNOSIS — E1142 Type 2 diabetes mellitus with diabetic polyneuropathy: Secondary | ICD-10-CM

## 2020-10-27 NOTE — Progress Notes (Signed)
Subjective: Mariah Mason presents today for annual diabetic foot examination and painful mycotic nails b/l that are difficult to trim. Pain interferes with ambulation. Aggravating factors include wearing enclosed shoe gear. Pain is relieved with periodic professional debridement.   Last checked her blood sugar yesterday and it was 130 mg/dl.   She states she received a prescription for compression stockings from Dr. Terrace Arabia at [facility] and has not been able to find a vendor to purchase her stockings.  Hoy Register, MD is patient's PCP. Last visit was: April, 2022.  Allergies  Allergen Reactions   Erythromycin Other (See Comments)    Does not remember reaction.   Objective: Mariah Mason is a pleasant 70 y.o.  African American female, obese in NAD. AAO x 3.  There were no vitals filed for this visit.  Vascular Examination: Neurovascular status unchanged b/l lower extremities. Capillary refill time to digits immediate b/l. Palpable pedal pulses b/l LE. Pedal hair sparse. Lower extremity skin temperature gradient within normal limits. No pain with calf compression b/l. Trace edema noted b/l lower extremities.  Dermatological Examination: Pedal skin with normal turgor, texture and tone bilaterally. No open wounds bilaterally. No interdigital macerations bilaterally. Toenails 1-5 b/l elongated, discolored, dystrophic, thickened, crumbly with subungual debris and tenderness to dorsal palpation.   Musculoskeletal: Normal muscle strength 5/5 to all lower extremity muscle groups bilaterally. No pain crepitus or joint limitation noted with ROM b/l. Hammertoes noted to the 2-5 bilaterally. Wearing appropriate fitting shoe gear. Utilizes cane for ambulation assistance.  Neurological Examination: Pt has subjective symptoms of neuropathy. Protective sensation intact 5/5 intact bilaterally with 10g monofilament b/l. Vibratory sensation intact b/l. Proprioception intact  bilaterally.   Hemoglobin A1C Latest Ref Rng & Units 08/02/2020 02/11/2020 11/11/2019  HGBA1C 4.8 - 5.6 % 7.9(H) 7.2(A) 6.9  Some recent data might be hidden   Assessment: 1. Pain due to onychomycosis of nail   2. Diabetic peripheral neuropathy associated with type 2 diabetes mellitus (HCC)    Plan: -Examined patient. -Provided patient with address/phone number to [facility] where she may purchase her compression hose. -Toenails 1-5 b/l were debrided in length and girth with sterile nail nippers and dremel without iatrogenic bleeding.  -Patient/POA to call should there be question/concern in the interim. -Patient to continue soft, supportive shoe gear daily. -Patient to report any pedal injuries to medical professional immediately.  Return in about 3 months (around 01/26/2021).  Freddie Breech, DPM

## 2020-11-03 ENCOUNTER — Other Ambulatory Visit: Payer: Self-pay | Admitting: Family Medicine

## 2020-11-03 ENCOUNTER — Telehealth: Payer: Self-pay | Admitting: Neurology

## 2020-11-03 DIAGNOSIS — E1149 Type 2 diabetes mellitus with other diabetic neurological complication: Secondary | ICD-10-CM

## 2020-11-03 DIAGNOSIS — R269 Unspecified abnormalities of gait and mobility: Secondary | ICD-10-CM

## 2020-11-03 DIAGNOSIS — E1142 Type 2 diabetes mellitus with diabetic polyneuropathy: Secondary | ICD-10-CM

## 2020-11-03 NOTE — Telephone Encounter (Signed)
Pts daughter called needing a referral for PT. Please give daughter a call back on her cell at [phone].

## 2020-11-03 NOTE — Telephone Encounter (Signed)
I spoke to the patient's dgt on DPR. Dr. Terrace Arabia had placed a PT order for the patient in April 2022. She went once then was unable to go back into the clinic for various medical issues (surgery, family w/ Covid). She tried to continue the exercises at home. She is feeling better now and feels like PT would be really helpful but the order has expired. New orders placed in Epic and will send to MD for approval.

## 2020-11-11 ENCOUNTER — Ambulatory Visit: Payer: Medicare Other | Attending: Neurology

## 2020-11-11 DIAGNOSIS — R2681 Unsteadiness on feet: Secondary | ICD-10-CM | POA: Insufficient documentation

## 2020-11-11 DIAGNOSIS — M6281 Muscle weakness (generalized): Secondary | ICD-10-CM | POA: Insufficient documentation

## 2020-11-11 DIAGNOSIS — E1142 Type 2 diabetes mellitus with diabetic polyneuropathy: Secondary | ICD-10-CM | POA: Insufficient documentation

## 2020-11-17 ENCOUNTER — Other Ambulatory Visit: Payer: Self-pay

## 2020-11-17 ENCOUNTER — Ambulatory Visit: Payer: Medicare Other

## 2020-11-17 DIAGNOSIS — M6281 Muscle weakness (generalized): Secondary | ICD-10-CM | POA: Diagnosis present

## 2020-11-17 DIAGNOSIS — E1142 Type 2 diabetes mellitus with diabetic polyneuropathy: Secondary | ICD-10-CM | POA: Diagnosis present

## 2020-11-17 DIAGNOSIS — R2681 Unsteadiness on feet: Secondary | ICD-10-CM

## 2020-11-17 NOTE — Patient Instructions (Signed)
Access Code: [email or web address] URL: [email or web address] Date: 11/17/2020 Prepared by: Gustavus Bryant  Exercises Toe Raises with Counter Support - 2 x daily - 7 x weekly - 2 sets - 10 reps Gastroc Stretch on Wall - 2 x daily - 7 x weekly - 2 sets - 30s hold Heel Toe Raises with Unilateral Counter Support - 2 x daily - 7 x weekly - 2 sets - 10 reps Mini Squat with Counter Support - 2 x daily - 7 x weekly - 2 sets - 10 reps

## 2020-11-17 NOTE — Therapy (Signed)
[facility] [address] [place], [place], [address] Phone: [phone]   Fax:  [phone]  Physical Therapy Treatment  Patient Details  Name: Mariah Mason MRN: [id number] Date of Birth: April 05, 1951 Referring Provider (PT): Dr. Terrace Arabia   Encounter Date: 11/17/2020   PT End of Session - 11/17/20 1203     Visit Number 2    Number of Visits 5    Date for PT Re-Evaluation 12/22/20    Authorization Type medicare/medicaid    PT Start Time 1025    PT Stop Time 1100    PT Time Calculation (min) 35 min    Equipment Utilized During Treatment Gait belt    Activity Tolerance Patient tolerated treatment well    Behavior During Therapy WFL for tasks assessed/performed             Past Medical History:  Diagnosis Date   Diabetes mellitus without complication (HCC)    Gout    Hyperlipemia    Hypertension    Neuropathy     Past Surgical History:  Procedure Laterality Date   CYST EXCISION N/A 08/30/2020   Procedure: EXCISION 1X2 cm LEFT NECK SEBACEOUS CYST EXCISION 2X2 CM RIGHT BACK SEBACEOUS CYST;  Surgeon: Emelia Loron, MD;  Location: [facility];  Service: General;  Laterality: N/A;   THYROID SURGERY      There were no vitals filed for this visit.   Subjective Assessment - 11/17/20 1028     Subjective Returns to PT following an episode of covid in her family which she did not contract, feels she is getting around well but fatigues with long distance walking    Patient is accompained by: Family member    Pertinent History Long history of diabetes, less dependent sensory changes, evidence of diabetic peripheral neuropathy, lower extremity pitting edema, likely all contributed to her complaints of lower extremity feet pulling sensation,              Gait abnormality are combination of her bilateral knee valgus, flatfeet, diabetic peripheral neuropathy, overweight              EMG nerve conduction study               Referral to physical therapy              Laboratory evaluation to rule out other treatable cause for neuropathy    Limitations Standing;Walking    How long can you sit comfortably? ulimited    How long can you stand comfortably? <10 min    How long can you walk comfortably? <10 min                [facility] PT Assessment - 11/17/20 0001       Berg Balance Test   Sit to Stand Able to stand  independently using hands    Standing Unsupported Able to stand safely 2 minutes    Sitting with Back Unsupported but Feet Supported on Floor or Stool Able to sit safely and securely 2 minutes    Stand to Sit Controls descent by using hands    Transfers Able to transfer safely, minor use of hands    Standing Unsupported with Eyes Closed Able to stand 10 seconds safely    Standing Unsupported with Feet Together Able to place feet together independently and stand 1 minute safely    From Standing, Reach Forward with Outstretched Arm Can reach confidently >25 cm (10")  From Standing Position, Pick up Object from Floor Able to pick up shoe safely and easily    From Standing Position, Turn to Look Behind Over each Shoulder Looks behind from both sides and weight shifts well    Turn 360 Degrees Able to turn 360 degrees safely but slowly    Standing Unsupported, Alternately Place Feet on Step/Stool Able to complete >2 steps/needs minimal assist    Standing Unsupported, One Foot in Front Able to take small step independently and hold 30 seconds    Standing on One Leg Unable to try or needs assist to prevent fall    Total Score 43      Dynamic Gait Index   Level Surface Mild Impairment    Change in Gait Speed Mild Impairment    Gait with Horizontal Head Turns Normal    Gait with Vertical Head Turns Normal    Gait and Pivot Turn Mild Impairment    Step Over Obstacle Moderate Impairment    Step Around Obstacles Mild Impairment    Steps Moderate Impairment    Total Score 16    DGI comment:  avoids stairs                                     PT Short Term Goals - 11/17/20 1212       PT SHORT TERM GOAL #1   Title Patient to demo I in HEP    Baseline [email or web address]    Time 2    Period Weeks    Status New    Target Date 12/08/20      PT SHORT TERM GOAL #2   Title patient to negotiate 4 steps with most apropriate pattern    Baseline TBD    Time 2    Period Weeks    Status New    Target Date 12/08/20               PT Long Term Goals - 11/17/20 1213       PT LONG TERM GOAL #1   Title patient to demo final HEP back to PT w/o need of cuing    Baseline initial HEP isuued    Time 4    Period Weeks    Status New    Target Date 12/22/20      PT LONG TERM GOAL #2   Title Patient to increase DGI score to 20    Baseline Initial DGI 16    Time 4    Period Weeks    Status New    Target Date 12/22/20      PT LONG TERM GOAL #3   Title Patient to ambulate 253ft with LRAD across level ground under S    Baseline 173ft in clinic distances with cane and CG assist    Time 4    Period Weeks    Status New    Target Date 12/22/20      PT LONG TERM GOAL #4   Title Increase BERG score to 47    Baseline Initial BERG score 43    Time 4    Period Weeks    Status New    Target Date 12/22/20      PT LONG TERM GOAL #5   Period Weeks    Status New                   Plan -  11/17/20 1203     Clinical Impression Statement Patient returns to OPPT following an episode of covid in her family which she did not contract.  She denies pain and reports only fatigue with long distance ambulation.  BERG and DGI asessed today with deficits noted with only high level tasks which may not significantly improve due to orthopedic isuues of L knee valgus and B pronation.  She is able to transfer on her own and ambulate in clinic with a cane and CGA.  She avoids stairs and has only one to enter her home.  She is a good candidate for several sessions of OPPT to  establish a HEP.    Personal Factors and Comorbidities Age;Comorbidity 2    Comorbidities DM, neuropathy    Examination-Activity Limitations Locomotion Level    Stability/Clinical Decision Making Stable/Uncomplicated    Rehab Potential Good    PT Frequency 1x / week    PT Duration 8 weeks    PT Treatment/Interventions ADLs/Self Care Home Management;Aquatic Therapy;DME Instruction;Gait training;Stair training;Functional mobility training;Therapeutic activities;Therapeutic exercise;Balance training;Neuromuscular re-education;Patient/family education;Orthotic Fit/Training    PT Next Visit Plan Assess TUG and DGI    PT Home Exercise Plan [email or web address]    Consulted and Agree with Plan of Care Patient             Patient will benefit from skilled therapeutic intervention in order to improve the following deficits and impairments:  Abnormal gait, Decreased endurance, Decreased activity tolerance, Decreased strength, Decreased balance, Decreased mobility, Difficulty walking, Decreased range of motion, Decreased coordination  Visit Diagnosis: Unsteadiness on feet  Muscle weakness (generalized)  DM type 2 with diabetic peripheral neuropathy [facility])     Problem List Patient Active Problem List   Diagnosis Date Noted   Bilateral lumbar radiculopathy 08/22/2020   DM type 2 with diabetic peripheral neuropathy (HCC) 08/02/2020   Gait abnormality 08/02/2020   Gout    Hypertension    Type 2 diabetes mellitus (HCC) 02/18/2019   AKI (acute kidney injury) (HCC) 02/18/2019   Hyperkalemia 02/18/2019   Metabolic acidosis 02/18/2019   Hypoglycemia 02/17/2019   Long term current use of oral hypoglycemic drug 05/23/2017   Presbyopia of both eyes 05/23/2017   Vitreous floaters of both eyes 05/23/2017   Keratoconjunctivitis sicca of both eyes not specified as Sjogren's 10/16/2016   Nuclear sclerotic cataract of both eyes 10/16/2016    Hildred Laser PT 11/17/2020, 12:18 PM  [facility] [address] [place], [place], [address] Phone: [phone]   Fax:  [phone]  Name: Reda Citron MRN: [id number] Date of Birth: [birth year]

## 2020-11-21 ENCOUNTER — Other Ambulatory Visit: Payer: Self-pay

## 2020-11-21 ENCOUNTER — Ambulatory Visit: Payer: Medicare Other

## 2020-11-21 DIAGNOSIS — M6281 Muscle weakness (generalized): Secondary | ICD-10-CM

## 2020-11-21 DIAGNOSIS — R2681 Unsteadiness on feet: Secondary | ICD-10-CM | POA: Diagnosis not present

## 2020-11-21 DIAGNOSIS — E1142 Type 2 diabetes mellitus with diabetic polyneuropathy: Secondary | ICD-10-CM

## 2020-11-21 NOTE — Therapy (Signed)
[facility] [address] [place], [place], [address] Phone: [phone]   Fax:  [phone]  Physical Therapy Treatment  Patient Details  Name: Mariah Mason MRN: [id number] Date of Birth: [birth year] Referring Provider (PT): Dr. Terrace Arabia   Encounter Date: 11/21/2020   PT End of Session - 11/21/20 1755     Visit Number 3    Number of Visits 5    Date for PT Re-Evaluation 12/22/20    Authorization Type medicare/medicaid    PT Start Time 1715    PT Stop Time 1755    PT Time Calculation (min) 40 min    Equipment Utilized During Treatment Gait belt    Activity Tolerance Patient tolerated treatment well    Behavior During Therapy WFL for tasks assessed/performed             Past Medical History:  Diagnosis Date   Diabetes mellitus without complication (HCC)    Gout    Hyperlipemia    Hypertension    Neuropathy     Past Surgical History:  Procedure Laterality Date   CYST EXCISION N/A 08/30/2020   Procedure: EXCISION 1X2 cm LEFT NECK SEBACEOUS CYST EXCISION 2X2 CM RIGHT BACK SEBACEOUS CYST;  Surgeon: Emelia Loron, MD;  Location: [facility];  Service: General;  Laterality: N/A;   THYROID SURGERY      There were no vitals filed for this visit.                      [facility] Adult PT Treatment/Exercise - 11/21/20 0001       Transfers   Transfers Sit to Stand    Comments 5x no UE assist      Knee/Hip Exercises: Standing   Hip Flexion Both;2 sets;10 reps    Hip Flexion Limitations alternating      Knee/Hip Exercises: Seated   Long Arc Quad Both;2 sets;10 reps;Weights;Limitations    Long Arc Quad Weight 2 lbs.    Long Arc Quad Limitations alt with Latissimus press    Heel Slides Both;2 sets;10 reps;Limitations    Heel Slides Limitations with towel    Marching Both;2 sets;10 reps;Weights;Limitations    Marching Limitations alt with latissimus spress    Marching Weights 2  lbs.    Hamstring Curl Both;2 sets;10 reps;Limitations    Hamstring Limitations 2x10 with yellow t-band      Ankle Exercises: Standing   Heel Raises Both;10 reps;Limitations    Heel Raises Limitations 2x10    Toe Raise 10 reps;Limitations    Toe Raise Limitations 2x10                 Balance Exercises - 11/21/20 0001       Balance Exercises: Standing   Step Ups Forward;Lateral;2 inch;UE support 1;Limitations    Step Ups Limitations S onto block 10x ea. using cane for support    Retro Gait Foam/compliant surface;Upper extremity support;4 reps;Limitations    Retro Gait Limitations fwd/bwd BUE support weaning to single for 4 trips in // bars    Sidestepping Foam/compliant support;3 reps;Limitations    Sidestepping Limitations across blue mat, BUE support weaning to single UE support                 PT Short Term Goals - 11/17/20 1212       PT SHORT TERM GOAL #1   Title Patient to demo I in HEP    Baseline [email or web address]    Time 2  Period Weeks    Status New    Target Date 12/08/20      PT SHORT TERM GOAL #2   Title patient to negotiate 4 steps with most apropriate pattern    Baseline TBD    Time 2    Period Weeks    Status New    Target Date 12/08/20               PT Long Term Goals - 11/17/20 1213       PT LONG TERM GOAL #1   Title patient to demo final HEP back to PT w/o need of cuing    Baseline initial HEP isuued    Time 4    Period Weeks    Status New    Target Date 12/22/20      PT LONG TERM GOAL #2   Title Patient to increase DGI score to 20    Baseline Initial DGI 16    Time 4    Period Weeks    Status New    Target Date 12/22/20      PT LONG TERM GOAL #3   Title Patient to ambulate 270ft with LRAD across level ground under S    Baseline 12ft in clinic distances with cane and CG assist    Time 4    Period Weeks    Status New    Target Date 12/22/20      PT LONG TERM GOAL #4   Title Increase BERG score to 47    Baseline  Initial BERG score 43    Time 4    Period Weeks    Status New    Target Date 12/22/20      PT LONG TERM GOAL #5   Period Weeks    Status New                   Plan - 11/21/20 1755     Clinical Impression Statement todays session focused on LE strength and balance training, emphasized alt. movements and WS onto blocks when performing stepping tasks, less flexion noted in L knee, able to advance to single UE support for balance exercises, L knee valgus and subsequent pronation alter gait    Personal Factors and Comorbidities Age;Comorbidity 2    Comorbidities DM, neuropathy    Examination-Activity Limitations Locomotion Level    Stability/Clinical Decision Making Stable/Uncomplicated    Rehab Potential Good    PT Frequency 1x / week    PT Duration 8 weeks    PT Treatment/Interventions ADLs/Self Care Home Management;Aquatic Therapy;DME Instruction;Gait training;Stair training;Functional mobility training;Therapeutic activities;Therapeutic exercise;Balance training;Neuromuscular re-education;Patient/family education;Orthotic Fit/Training    PT Next Visit Plan continue gait, balance and stair training w/o incurring knee pain    PT Home Exercise Plan [email or web address]    Consulted and Agree with Plan of Care Patient             Patient will benefit from skilled therapeutic intervention in order to improve the following deficits and impairments:  Abnormal gait, Decreased endurance, Decreased activity tolerance, Decreased strength, Decreased balance, Decreased mobility, Difficulty walking, Decreased range of motion, Decreased coordination  Visit Diagnosis: Unsteadiness on feet  Muscle weakness (generalized)  DM type 2 with diabetic peripheral neuropathy [facility])     Problem List Patient Active Problem List   Diagnosis Date Noted   Bilateral lumbar radiculopathy 08/22/2020   DM type 2 with diabetic peripheral neuropathy (HCC) 08/02/2020   Gait abnormality 08/02/2020   Gout  Hypertension    Type 2 diabetes mellitus (HCC) 02/18/2019   AKI (acute kidney injury) (HCC) 02/18/2019   Hyperkalemia 02/18/2019   Metabolic acidosis 02/18/2019   Hypoglycemia 02/17/2019   Long term current use of oral hypoglycemic drug 05/23/2017   Presbyopia of both eyes 05/23/2017   Vitreous floaters of both eyes 05/23/2017   Keratoconjunctivitis sicca of both eyes not specified as Sjogren's 10/16/2016   Nuclear sclerotic cataract of both eyes 10/16/2016    Hildred Laser PT 11/21/2020, 5:59 PM  [facility] [address] [place], [place], [address] Phone: [phone]   Fax:  [phone]  Name: Mariah Mason MRN: [id number] Date of Birth: 09-15-1950

## 2020-11-23 ENCOUNTER — Encounter: Payer: Self-pay | Admitting: Family Medicine

## 2020-11-23 ENCOUNTER — Other Ambulatory Visit: Payer: Self-pay

## 2020-11-23 ENCOUNTER — Ambulatory Visit: Payer: Medicare Other | Attending: Family Medicine | Admitting: Family Medicine

## 2020-11-23 VITALS — BP 155/74 | HR 88 | Ht 64.0 in | Wt 214.0 lb

## 2020-11-23 DIAGNOSIS — Z1159 Encounter for screening for other viral diseases: Secondary | ICD-10-CM

## 2020-11-23 DIAGNOSIS — E785 Hyperlipidemia, unspecified: Secondary | ICD-10-CM | POA: Diagnosis not present

## 2020-11-23 DIAGNOSIS — I129 Hypertensive chronic kidney disease with stage 1 through stage 4 chronic kidney disease, or unspecified chronic kidney disease: Secondary | ICD-10-CM | POA: Insufficient documentation

## 2020-11-23 DIAGNOSIS — E1159 Type 2 diabetes mellitus with other circulatory complications: Secondary | ICD-10-CM | POA: Diagnosis not present

## 2020-11-23 DIAGNOSIS — N183 Chronic kidney disease, stage 3 unspecified: Secondary | ICD-10-CM | POA: Diagnosis not present

## 2020-11-23 DIAGNOSIS — Z794 Long term (current) use of insulin: Secondary | ICD-10-CM

## 2020-11-23 DIAGNOSIS — Z7984 Long term (current) use of oral hypoglycemic drugs: Secondary | ICD-10-CM | POA: Insufficient documentation

## 2020-11-23 DIAGNOSIS — E1169 Type 2 diabetes mellitus with other specified complication: Secondary | ICD-10-CM | POA: Diagnosis not present

## 2020-11-23 DIAGNOSIS — E1149 Type 2 diabetes mellitus with other diabetic neurological complication: Secondary | ICD-10-CM

## 2020-11-23 DIAGNOSIS — Z79899 Other long term (current) drug therapy: Secondary | ICD-10-CM | POA: Insufficient documentation

## 2020-11-23 DIAGNOSIS — E1140 Type 2 diabetes mellitus with diabetic neuropathy, unspecified: Secondary | ICD-10-CM | POA: Diagnosis present

## 2020-11-23 DIAGNOSIS — E1122 Type 2 diabetes mellitus with diabetic chronic kidney disease: Secondary | ICD-10-CM | POA: Diagnosis not present

## 2020-11-23 DIAGNOSIS — M1A00X0 Idiopathic chronic gout, unspecified site, without tophus (tophi): Secondary | ICD-10-CM | POA: Diagnosis not present

## 2020-11-23 DIAGNOSIS — I152 Hypertension secondary to endocrine disorders: Secondary | ICD-10-CM

## 2020-11-23 LAB — POCT GLYCOSYLATED HEMOGLOBIN (HGB A1C): HbA1c, POC (controlled diabetic range): 7.4 % — AB (ref 0.0–7.0)

## 2020-11-23 LAB — GLUCOSE, POCT (MANUAL RESULT ENTRY): POC Glucose: 125 mg/dl — AB (ref 70–99)

## 2020-11-23 MED ORDER — ALLOPURINOL 100 MG PO TABS: ORAL_TABLET | ORAL | 1 refills | Status: DC

## 2020-11-23 MED ORDER — HYDRALAZINE HCL 25 MG PO TABS: 25.0000 mg | ORAL_TABLET | Freq: Two times a day (BID) | ORAL | 1 refills | Status: DC

## 2020-11-23 MED ORDER — EZETIMIBE 10 MG PO TABS: 10.0000 mg | ORAL_TABLET | Freq: Every day | ORAL | 3 refills | Status: DC

## 2020-11-23 MED ORDER — ATORVASTATIN CALCIUM 20 MG PO TABS: 20.0000 mg | ORAL_TABLET | Freq: Every day | ORAL | 1 refills | Status: DC

## 2020-11-23 MED ORDER — CARVEDILOL 25 MG PO TABS: ORAL_TABLET | ORAL | 1 refills | Status: DC

## 2020-11-23 NOTE — Progress Notes (Signed)
Subjective:  Patient ID: Mariah Mason, female    DOB: 09/23/1950  Age: 70 y.o. MRN: [id number]  CC: Diabetes   HPI [name]  is a 70 year old female with a history of hypertension, type 2 diabetes mellitus (A1c 7.4), Stage 3 CKD, Gout who presents today for follow-up visit.  Interval History: She has not been taking her Atorvastatin as she had complained of 'pulling in her legs' with both Atorvastatin and Pravastatin and was placed on Zetia instead. She has also been using Fish oil caps. She also has diabetic neuropathy and has been unable to tolerate gabapentin for which had referred her to neurology.  Compression stockings were prescribed and she was referred for PT. She no longer has the ' pulling sensation in her legs'.  Compliant with her antihypertensive but her blood pressure is elevated and she shows me an empty bottle of hydralazine. Doing well on Januvia for her diabetes mellitus. She reports no recent gout flares. She has no additional concerns today.  Past Medical History:  Diagnosis Date   Diabetes mellitus without complication ([place])    Gout    Hyperlipemia    Hypertension    Neuropathy     Past Surgical History:  Procedure Laterality Date   CYST EXCISION N/A 08/30/2020   Procedure: EXCISION 1X2 cm LEFT NECK SEBACEOUS CYST EXCISION 2X2 CM RIGHT BACK SEBACEOUS CYST;  Surgeon: Rolm Bookbinder, MD;  Location: [place];  Service: General;  Laterality: N/A;   THYROID SURGERY      Family History  Problem Relation Age of Onset   Heart attack Mother    Kidney failure Father     Allergies  Allergen Reactions   Erythromycin Other (See Comments)    Does not remember reaction.    Outpatient Medications Prior to Visit  Medication Sig Dispense Refill   amLODipine (NORVASC) 10 MG tablet TAKE 1 TABLET(10 MG) BY MOUTH DAILY 90 tablet 1   blood glucose meter kit and supplies KIT Dispense based on patient and insurance preference. Use up to four  times daily as directed. (FOR ICD-9 250.00, 250.01). 1 each 0   cholecalciferol (VITAMIN D3) 25 MCG (1000 UT) tablet Take 1,000 Units by mouth daily.     colchicine 0.6 MG tablet TAKE 2 TABLETS(1.2 MG) BY MOUTH AT ONSET FOR GOUT. MAY REPEAT 1 TABLET 0.6 MG IN 2 HOURS IF SYMPTOMS PERSIST 30 tablet 1   cycloSPORINE (RESTASIS) 0.05 % ophthalmic emulsion Place 1 drop into both eyes 2 (two) times daily.      Elastic Bandages & Supports (MEDICAL COMPRESSION SOCKS) MISC Compression socks for DM peripheral neuropathy and bilateral leg swelling 2 each 3   Misc. Devices [place] Shower chair. Dx- Diabetes mellitus 1 each 0   Multiple Vitamins-Minerals (CENTRUM SILVER ADULT 50+ PO) Take 1 tablet by mouth daily.     NONFORMULARY OR COMPOUNDED ITEM Peripheral Neuropathy Cream: Bupivacaine 1%, Doxepin 3%, Gabapentin 6%, Pentoxifylline 3%, Topiramate 1% Order faxed to [facility] 1 each 3   Omega-3 Fatty Acids (FISH OIL) 1200 MG CAPS Take by mouth.     sitaGLIPtin (JANUVIA) 50 MG tablet TAKE 1 TABLET(50 MG) BY MOUTH DAILY 90 tablet 0   allopurinol (ZYLOPRIM) 100 MG tablet TAKE 1 TABLET(100 MG) BY MOUTH DAILY 30 tablet 0   atorvastatin (LIPITOR) 20 MG tablet Take 20 mg by mouth daily.     carvedilol (COREG) 25 MG tablet TAKE 1 TABLET(25 MG) BY MOUTH TWICE DAILY WITH A MEAL 180 tablet 1  ezetimibe (ZETIA) 10 MG tablet TAKE 1 TABLET(10 MG) BY MOUTH DAILY (Patient not taking: Reported on 11/23/2020) 30 tablet 1   hydrALAZINE (APRESOLINE) 25 MG tablet Take 25 mg by mouth 2 (two) times daily. (Patient not taking: No sig reported)     No facility-administered medications prior to visit.     ROS Review of Systems  Constitutional:  Negative for activity change, appetite change and fatigue.  HENT:  Negative for congestion, sinus pressure and sore throat.   Eyes:  Negative for visual disturbance.  Respiratory:  Negative for cough, chest tightness, shortness of breath and wheezing.   Cardiovascular:  Negative  for chest pain and palpitations.  Gastrointestinal:  Negative for abdominal distention, abdominal pain and constipation.  Endocrine: Negative for polydipsia.  Genitourinary:  Negative for dysuria and frequency.  Musculoskeletal:  Negative for arthralgias and back pain.  Skin:  Negative for rash.  Neurological:  Negative for tremors, light-headedness and numbness.  Hematological:  Does not bruise/bleed easily.  Psychiatric/Behavioral:  Negative for agitation and behavioral problems.    Objective:  BP (!) 155/74   Pulse 88   Ht 5' 4"  (1.626 m)   Wt 214 lb (97.1 kg)   SpO2 100%   BMI 36.73 kg/m   BP/Weight 11/23/2020 08/30/2020 08/05/6806  Systolic BP 811 031 594  Diastolic BP 74 56 74  Wt. (Lbs) 214 208.11 212  BMI 36.73 35.72 36.39      Physical Exam Constitutional:      Appearance: She is well-developed.  Cardiovascular:     Rate and Rhythm: Normal rate.     Heart sounds: Normal heart sounds. No murmur heard. Pulmonary:     Effort: Pulmonary effort is normal.     Breath sounds: Normal breath sounds. No wheezing or rales.  Chest:     Chest wall: No tenderness.  Abdominal:     General: Bowel sounds are normal. There is no distension.     Palpations: Abdomen is soft. There is no mass.     Tenderness: There is no abdominal tenderness.  Musculoskeletal:     Right lower leg: No edema.     Left lower leg: No edema.     Comments: Genu valgus bilaterally Ambulates with a cane  Neurological:     Mental Status: She is alert and oriented to person, place, and time.     Gait: Gait abnormal.  Psychiatric:        Mood and Affect: Mood normal.    CMP Latest Ref Rng & Units 08/16/2020 08/02/2020 02/11/2020  Glucose 70 - 99 mg/dL 162(H) 176(H) 131(H)  BUN 8 - 23 mg/dL 32(H) 41(H) 31(H)  Creatinine 0.44 - 1.00 mg/dL 1.35(H) 1.71(H) 1.30(H)  Sodium 135 - 145 mmol/L 135 144 141  Potassium 3.5 - 5.1 mmol/L 4.2 4.4 4.5  Chloride 98 - 111 mmol/L 104 105 106  CO2 22 - 32 mmol/L 22 20 24    Calcium 8.9 - 10.3 mg/dL 9.1 9.7 10.2  Total Protein 6.5 - 8.1 g/dL 7.8 7.3 7.3  Total Bilirubin 0.3 - 1.2 mg/dL 0.3 0.2 0.3  Alkaline Phos 38 - 126 U/L 63 83 74  AST 15 - 41 U/L 15 12 14   ALT 0 - 44 U/L 15 12 11     Lipid Panel     Component Value Date/Time   CHOL 166 12/24/2019 1159   TRIG 188 (H) 12/24/2019 1159   HDL 32 (L) 12/24/2019 1159   CHOLHDL 5.2 (H) 12/24/2019 1159   LDLCALC 101 (  H) 12/24/2019 1159    CBC    Component Value Date/Time   WBC 7.8 08/16/2020 1444   RBC 4.44 08/16/2020 1444   HGB 12.2 08/16/2020 1444   HGB 11.8 08/02/2020 1038   HCT 37.0 08/16/2020 1444   HCT 36.3 08/02/2020 1038   PLT 240 08/16/2020 1444   PLT 279 08/02/2020 1038   MCV 83.3 08/16/2020 1444   MCV 82 08/02/2020 1038   MCH 27.5 08/16/2020 1444   MCHC 33.0 08/16/2020 1444   RDW 13.0 08/16/2020 1444   RDW 12.9 08/02/2020 1038   LYMPHSABS 1.7 08/16/2020 1444   LYMPHSABS 1.8 08/02/2020 1038   MONOABS 0.4 08/16/2020 1444   EOSABS 0.2 08/16/2020 1444   EOSABS 0.1 08/02/2020 1038   BASOSABS 0.0 08/16/2020 1444   BASOSABS 0.0 08/02/2020 1038    Lab Results  Component Value Date   HGBA1C 7.4 (A) 11/23/2020    Assessment & Plan:  1. Type 2 diabetes mellitus with other neurologic complication, with long-term current use of insulin (HCC) Controlled with A1c of 7.4; goal is less than 7.5 due to age Continue Januvia Counseled on Diabetic diet, my plate method, 903 minutes of moderate intensity exercise/week Blood sugar logs with fasting goals of 80-120 mg/dl, random of less than 180 and in the event of sugars less than 60 mg/dl or greater than 400 mg/dl encouraged to notify the clinic. Advised on the need for annual eye exams, annual foot exams, Pneumonia vaccine. - POCT glucose (manual entry) - POCT glycosylated hemoglobin (Hb A1C) - Lipid panel; Future - CMP14+EGFR; Future  2. Idiopathic chronic gout without tophus, unspecified site Controlled with no recent flares -  allopurinol (ZYLOPRIM) 100 MG tablet; TAKE 1 TABLET(100 MG) BY MOUTH DAILY  Dispense: 90 tablet; Refill: 1  3. Hypertension complicating diabetes ([place]) Uncontrolled I have refilled hydralazine Counseled on blood pressure goal of less than 130/80, low-sodium, DASH diet, medication compliance, 150 minutes of moderate intensity exercise per week. Discussed medication compliance, adverse effects. - carvedilol (COREG) 25 MG tablet; TAKE 1 TABLET(25 MG) BY MOUTH TWICE DAILY WITH A MEAL  Dispense: 180 tablet; Refill: 1  4. Need for hepatitis C screening test  HCV RNA quant rflx ultra or genotyp([facility]); Future  5. Hyperlipidemia associated with type 2 diabetes mellitus (HCC) Last LDL was 101 which is above goal of less than 70 Previously unable to tolerate statin due to " pulling in legs" but at the time she also had diabetic neuropathy Will try atorvastatin again every other day in addition to Zetia and patient advised to notify me if leg symptoms return Low-cholesterol diet - ezetimibe (ZETIA) 10 MG tablet; Take 1 tablet (10 mg total) by mouth daily.  Dispense: 30 tablet; Refill: 3 - atorvastatin (LIPITOR) 20 MG tablet; Take 1 tablet (20 mg total) by mouth daily.  Dispense: 90 tablet; Refill: 1   Meds ordered this encounter  Medications   ezetimibe (ZETIA) 10 MG tablet    Sig: Take 1 tablet (10 mg total) by mouth daily.    Dispense:  30 tablet    Refill:  3    Must keep upcoming office visit for refills   hydrALAZINE (APRESOLINE) 25 MG tablet    Sig: Take 1 tablet (25 mg total) by mouth 2 (two) times daily.    Dispense:  180 tablet    Refill:  1   allopurinol (ZYLOPRIM) 100 MG tablet    Sig: TAKE 1 TABLET(100 MG) BY MOUTH DAILY    Dispense:  90  tablet    Refill:  1   carvedilol (COREG) 25 MG tablet    Sig: TAKE 1 TABLET(25 MG) BY MOUTH TWICE DAILY WITH A MEAL    Dispense:  180 tablet    Refill:  1   atorvastatin (LIPITOR) 20 MG tablet    Sig: Take 1 tablet (20 mg total)  by mouth daily.    Dispense:  90 tablet    Refill:  1     Follow-up: Return in about 3 months (around 02/23/2021) for Medical conditions.       Charlott Rakes, MD, FAAFP. [facility], [place]   11/23/2020, 5:04 PM

## 2020-11-28 ENCOUNTER — Ambulatory Visit: Payer: Medicare Other | Attending: Neurology | Admitting: Physical Therapy

## 2020-11-28 ENCOUNTER — Other Ambulatory Visit: Payer: Self-pay

## 2020-11-28 ENCOUNTER — Encounter: Payer: Self-pay | Admitting: Physical Therapy

## 2020-11-28 DIAGNOSIS — E1142 Type 2 diabetes mellitus with diabetic polyneuropathy: Secondary | ICD-10-CM | POA: Diagnosis present

## 2020-11-28 DIAGNOSIS — M6281 Muscle weakness (generalized): Secondary | ICD-10-CM

## 2020-11-28 DIAGNOSIS — R2681 Unsteadiness on feet: Secondary | ICD-10-CM | POA: Diagnosis present

## 2020-11-28 NOTE — Therapy (Signed)
[facility] [address] [place], [place], [address] Phone: [phone]   Fax:  [phone]  Physical Therapy Treatment  Patient Details  Name: Mariah Mason MRN: [id number] Date of Birth: 26-Dec-1950 Referring Provider (PT): Dr. Terrace Arabia   Encounter Date: 11/28/2020   PT End of Session - 11/28/20 1155     Visit Number 4    Number of Visits 5    Date for PT Re-Evaluation 12/22/20    Authorization Type medicare/medicaid    PT Start Time 1105    PT Stop Time 1144    PT Time Calculation (min) 39 min    Equipment Utilized During Treatment Gait belt    Activity Tolerance Patient tolerated treatment well    Behavior During Therapy WFL for tasks assessed/performed             Past Medical History:  Diagnosis Date   Diabetes mellitus without complication (HCC)    Gout    Hyperlipemia    Hypertension    Neuropathy     Past Surgical History:  Procedure Laterality Date   CYST EXCISION N/A 08/30/2020   Procedure: EXCISION 1X2 cm LEFT NECK SEBACEOUS CYST EXCISION 2X2 CM RIGHT BACK SEBACEOUS CYST;  Surgeon: Emelia Loron, MD;  Location: [facility];  Service: General;  Laterality: N/A;   THYROID SURGERY      There were no vitals filed for this visit.   Subjective Assessment - 11/28/20 1107     Subjective Pt reports doing HEP 2x/day. No falls since last visit.    Patient is accompained by: Family member    Pertinent History Long history of diabetes, less dependent sensory changes, evidence of diabetic peripheral neuropathy, lower extremity pitting edema, likely all contributed to her complaints of lower extremity feet pulling sensation,              Gait abnormality are combination of her bilateral knee valgus, flatfeet, diabetic peripheral neuropathy, overweight              EMG nerve conduction study              Referral to physical therapy              Laboratory evaluation to rule out other treatable  cause for neuropathy    Limitations Standing;Walking    How long can you sit comfortably? ulimited    How long can you stand comfortably? <10 min    How long can you walk comfortably? <10 min    Currently in Pain? No/denies                               [facility] Adult PT Treatment/Exercise - 11/28/20 0001       Knee/Hip Exercises: Seated   Long Arc Quad Both;2 sets;10 reps;Weights;Limitations    Long Arc Quad Weight 3 lbs.    Heel Slides --    Heel Slides Limitations --    Hamstring Curl Both;2 sets;10 reps;Limitations    Hamstring Limitations 2x10 with red t-band      Ankle Exercises: Standing   Heel Raises Both;10 reps;Limitations    Heel Raises Limitations 2x10    Toe Raise 10 reps;Limitations    Toe Raise Limitations 2x10                 Balance Exercises - 11/28/20 0001       Balance Exercises: Standing   Standing  Eyes Opened Narrow base of support (BOS);Other reps (comment)   pt stepped forward holding stance without UE support, with bil Ue raises and upper trunk rotations; min guard.   Standing, One Foot on a Step Eyes open;4 inch   progressing from [facility] to [facility] to no UE support, and progressing upright gaze to head turns without UE support, min guard.   Sidestepping 2 reps;1 rep;Other reps (comment)   progressing from 2-0 UE support at counter top              PT Education - 11/28/20 1124     Education Details Reviewed parts of HEP    Person(s) Educated Patient    Methods Explanation;Verbal cues    Comprehension Verbalized understanding;Returned demonstration              PT Short Term Goals - 11/17/20 1212       PT SHORT TERM GOAL #1   Title Patient to demo I in HEP    Baseline [id number]    Time 2    Period Weeks    Status New    Target Date 12/08/20      PT SHORT TERM GOAL #2   Title patient to negotiate 4 steps with most apropriate pattern    Baseline TBD    Time 2    Period Weeks    Status New    Target Date  12/08/20               PT Long Term Goals - 11/17/20 1213       PT LONG TERM GOAL #1   Title patient to demo final HEP back to PT w/o need of cuing    Baseline initial HEP isuued    Time 4    Period Weeks    Status New    Target Date 12/22/20      PT LONG TERM GOAL #2   Title Patient to increase DGI score to 20    Baseline Initial DGI 16    Time 4    Period Weeks    Status New    Target Date 12/22/20      PT LONG TERM GOAL #3   Title Patient to ambulate 234ft with LRAD across level ground under S    Baseline 155ft in clinic distances with cane and CG assist    Time 4    Period Weeks    Status New    Target Date 12/22/20      PT LONG TERM GOAL #4   Title Increase BERG score to 47    Baseline Initial BERG score 43    Time 4    Period Weeks    Status New    Target Date 12/22/20      PT LONG TERM GOAL #5   Period Weeks    Status New                   Plan - 11/28/20 1125     Clinical Impression Statement Strength training for bil. LE strengthening working on progressing weight and resistence; pt tolerated without knee pain.  Standing balance training progressing stepping activties on level surface to no UE support and dynamic standing balance on level surface without UE support with min guard A for safety.    Personal Factors and Comorbidities Age;Comorbidity 2    Comorbidities DM, neuropathy    Examination-Activity Limitations Locomotion Level    Stability/Clinical Decision Making Stable/Uncomplicated    Rehab Potential  Good    PT Frequency 1x / week    PT Duration 8 weeks    PT Treatment/Interventions ADLs/Self Care Home Management;Aquatic Therapy;DME Instruction;Gait training;Stair training;Functional mobility training;Therapeutic activities;Therapeutic exercise;Balance training;Neuromuscular re-education;Patient/family education;Orthotic Fit/Training    PT Next Visit Plan continue gait, balance and stair training w/o incurring knee pain    PT  Home Exercise Plan [id number]    Consulted and Agree with Plan of Care Patient             Patient will benefit from skilled therapeutic intervention in order to improve the following deficits and impairments:  Abnormal gait, Decreased endurance, Decreased activity tolerance, Decreased strength, Decreased balance, Decreased mobility, Difficulty walking, Decreased range of motion, Decreased coordination  Visit Diagnosis: Unsteadiness on feet  Muscle weakness (generalized)  DM type 2 with diabetic peripheral neuropathy [facility])     Problem List Patient Active Problem List   Diagnosis Date Noted   Bilateral lumbar radiculopathy 08/22/2020   DM type 2 with diabetic peripheral neuropathy (HCC) 08/02/2020   Gait abnormality 08/02/2020   Gout    Hypertension    Type 2 diabetes mellitus (HCC) 02/18/2019   AKI (acute kidney injury) (HCC) 02/18/2019   Hyperkalemia 02/18/2019   Metabolic acidosis 02/18/2019   Hypoglycemia 02/17/2019   Long term current use of oral hypoglycemic drug 05/23/2017   Presbyopia of both eyes 05/23/2017   Vitreous floaters of both eyes 05/23/2017   Keratoconjunctivitis sicca of both eyes not specified as Sjogren's 10/16/2016   Nuclear sclerotic cataract of both eyes 10/16/2016    Hortencia Conradi, PTA  11/28/20, 12:01 PM    [facility] [address] [place], [place], [address] Phone: [phone]   Fax:  [phone]  Name: Mariah Mason MRN: [id number] Date of Birth: [birth year]

## 2020-12-09 ENCOUNTER — Other Ambulatory Visit: Payer: Medicare Other

## 2020-12-09 ENCOUNTER — Other Ambulatory Visit: Payer: Self-pay

## 2020-12-09 ENCOUNTER — Ambulatory Visit: Payer: Medicare Other

## 2020-12-09 ENCOUNTER — Ambulatory Visit: Payer: Medicare Other | Attending: Family Medicine

## 2020-12-09 DIAGNOSIS — R2681 Unsteadiness on feet: Secondary | ICD-10-CM | POA: Diagnosis not present

## 2020-12-09 DIAGNOSIS — Z1159 Encounter for screening for other viral diseases: Secondary | ICD-10-CM

## 2020-12-09 DIAGNOSIS — Z794 Long term (current) use of insulin: Secondary | ICD-10-CM

## 2020-12-09 DIAGNOSIS — M6281 Muscle weakness (generalized): Secondary | ICD-10-CM

## 2020-12-09 DIAGNOSIS — E1149 Type 2 diabetes mellitus with other diabetic neurological complication: Secondary | ICD-10-CM

## 2020-12-09 DIAGNOSIS — E1142 Type 2 diabetes mellitus with diabetic polyneuropathy: Secondary | ICD-10-CM

## 2020-12-09 NOTE — Patient Instructions (Signed)
CG interested in obtaining ankle weights and was instructed to search [facility]

## 2020-12-09 NOTE — Therapy (Signed)
[facility] [address] [place], [place], [address] Phone: [phone]   Fax:  [phone]  Physical Therapy Treatment  Patient Details  Name: Mariah Mason MRN: [id number] Date of Birth: 06-04-1950 Referring Provider (PT): Dr. Terrace Arabia   Encounter Date: 12/09/2020   PT End of Session - 12/09/20 0853     Visit Number 5    Number of Visits 6    Date for PT Re-Evaluation 12/22/20    Authorization Type medicare/medicaid    PT Start Time 0800    PT Stop Time 0845    PT Time Calculation (min) 45 min    Equipment Utilized During Treatment Gait belt    Activity Tolerance Patient tolerated treatment well    Behavior During Therapy WFL for tasks assessed/performed             Past Medical History:  Diagnosis Date   Diabetes mellitus without complication (HCC)    Gout    Hyperlipemia    Hypertension    Neuropathy     Past Surgical History:  Procedure Laterality Date   CYST EXCISION N/A 08/30/2020   Procedure: EXCISION 1X2 cm LEFT NECK SEBACEOUS CYST EXCISION 2X2 CM RIGHT BACK SEBACEOUS CYST;  Surgeon: Emelia Loron, MD;  Location: [facility];  Service: General;  Laterality: N/A;   THYROID SURGERY      There were no vitals filed for this visit.   Subjective Assessment - 12/09/20 0808     Subjective No pain, no med changes or fall/LOB    Patient is accompained by: Family member    Limitations Standing;Walking    How long can you sit comfortably? ulimited    How long can you stand comfortably? <10 min    How long can you walk comfortably? <10 min                               [facility] Adult PT Treatment/Exercise - 12/09/20 0001       Transfers   Transfers Sit to Stand    Number of Reps 10 reps    Comments arms crossed      Knee/Hip Exercises: Standing   Knee Flexion Both;2 sets;15 reps    Knee Flexion Limitations BUE support      Knee/Hip Exercises: Seated   Long  Arc Quad Both;2 sets;Weights;Limitations;15 reps    Long Arc Quad Weight 3 lbs.    Long Arc Quad Limitations alt with Latissimus press    Heel Slides Both;2 sets;Limitations;15 reps    Heel Slides Limitations with towel and 3#    Marching Strengthening;Both;2 sets;15 reps    Marching Limitations alt with latissimus spress    Marching Weights 3 lbs.    Hamstring Curl 2 sets;15 reps    Hamstring Limitations BUE support with 3#      Ankle Exercises: Standing   Heel Raises Both;15 reps    Heel Raises Limitations 2x15    Toe Raise 15 reps    Toe Raise Limitations 2x15                 Balance Exercises - 12/09/20 0001       Balance Exercises: Standing   Step Ups Forward;4 inch;Intermittent UE support;Limitations    Step Ups Limitations single leg onto 4' block with OH reach, alt. 10x per leg                 PT  Short Term Goals - 12/09/20 0815       PT SHORT TERM GOAL #1   Title Patient to demo I in HEP    Baseline [id number]; 12/09/20 Patient I in initial HEP    Time 2    Period Weeks    Status Achieved    Target Date 12/08/20      PT SHORT TERM GOAL #2   Title patient to negotiate 4 steps with most apropriate pattern    Baseline TBD/ 12/09/20 Able to negotiate 4 seps with single rail, cane and step to pattern    Time 2    Period Weeks    Status Achieved    Target Date 12/08/20               PT Long Term Goals - 12/09/20 5784       PT LONG TERM GOAL #1   Title patient to demo final HEP back to PT w/o need of cuing    Baseline initial HEP isuued    Time 4    Period Weeks    Status New      PT LONG TERM GOAL #2   Title Patient to increase DGI score to 20    Baseline Initial DGI 16    Time 4    Period Weeks    Status New      PT LONG TERM GOAL #3   Title Patient to ambulate 269ft with LRAD across level ground under S    Baseline 174ft in clinic distances with cane and CG assist    Time 4    Period Weeks    Status New      PT LONG TERM  GOAL #4   Title Increase BERG score to 47    Baseline Initial BERG score 43    Time 4    Period Weeks    Status New      PT LONG TERM GOAL #5   Period Weeks    Status New                   Plan - 12/09/20 0849     Clinical Impression Statement Todays session focused on continued LE serngthening in sit/stand positions, increased weight and reps as noted, incorporated trunk and core strengthening into tasks as well as ostural training.  Reviewed stair training and insteructed in most appropriate pattern to use.  Patient on schedule for DC next session.    Personal Factors and Comorbidities Age;Comorbidity 2    Comorbidities DM, neuropathy    Examination-Activity Limitations Locomotion Level    Stability/Clinical Decision Making Stable/Uncomplicated    Rehab Potential Good    PT Frequency 1x / week    PT Duration 8 weeks    PT Treatment/Interventions ADLs/Self Care Home Management;Aquatic Therapy;DME Instruction;Gait training;Stair training;Functional mobility training;Therapeutic activities;Therapeutic exercise;Balance training;Neuromuscular re-education;Patient/family education;Orthotic Fit/Training    PT Next Visit Plan Assess HEP and goals in anticipation od DC    PT Home Exercise Plan [id number]    Consulted and Agree with Plan of Care Patient             Patient will benefit from skilled therapeutic intervention in order to improve the following deficits and impairments:  Abnormal gait, Decreased endurance, Decreased activity tolerance, Decreased strength, Decreased balance, Decreased mobility, Difficulty walking, Decreased range of motion, Decreased coordination  Visit Diagnosis: Unsteadiness on feet  Muscle weakness (generalized)  DM type 2 with diabetic peripheral neuropathy (HCC)     Problem  List Patient Active Problem List   Diagnosis Date Noted   Bilateral lumbar radiculopathy 08/22/2020   DM type 2 with diabetic peripheral neuropathy (HCC) 08/02/2020    Gait abnormality 08/02/2020   Gout    Hypertension    Type 2 diabetes mellitus (HCC) 02/18/2019   AKI (acute kidney injury) (HCC) 02/18/2019   Hyperkalemia 02/18/2019   Metabolic acidosis 02/18/2019   Hypoglycemia 02/17/2019   Long term current use of oral hypoglycemic drug 05/23/2017   Presbyopia of both eyes 05/23/2017   Vitreous floaters of both eyes 05/23/2017   Keratoconjunctivitis sicca of both eyes not specified as Sjogren's 10/16/2016   Nuclear sclerotic cataract of both eyes 10/16/2016    Hildred Laser PT 12/09/2020, 8:59 AM  [facility] [address] [place], [place], [address] Phone: [phone]   Fax:  [phone]  Name: Hanaa Payes MRN: [id number] Date of Birth: 11-29-1950

## 2020-12-11 LAB — CMP14+EGFR
ALT: 11 IU/L (ref 0–32)
AST: 13 IU/L (ref 0–40)
Albumin/Globulin Ratio: 1.4 (ref 1.2–2.2)
Albumin: 4 g/dL (ref 3.8–4.8)
Alkaline Phosphatase: 77 IU/L (ref 44–121)
BUN/Creatinine Ratio: 24 (ref 12–28)
BUN: 40 mg/dL — ABNORMAL HIGH (ref 8–27)
Bilirubin Total: 0.3 mg/dL (ref 0.0–1.2)
CO2: 18 mmol/L — ABNORMAL LOW (ref 20–29)
Calcium: 9.1 mg/dL (ref 8.7–10.3)
Chloride: 105 mmol/L (ref 96–106)
Creatinine, Ser: 1.67 mg/dL — ABNORMAL HIGH (ref 0.57–1.00)
Globulin, Total: 2.8 g/dL (ref 1.5–4.5)
Glucose: 194 mg/dL — ABNORMAL HIGH (ref 65–99)
Potassium: 4.5 mmol/L (ref 3.5–5.2)
Sodium: 141 mmol/L (ref 134–144)
Total Protein: 6.8 g/dL (ref 6.0–8.5)
eGFR: 33 mL/min/{1.73_m2} — ABNORMAL LOW (ref >59)

## 2020-12-11 LAB — HCV RNA QUANT RFLX ULTRA OR GENOTYP: HCV Quant Baseline: NOT DETECTED IU/mL

## 2020-12-11 LAB — LIPID PANEL
Chol/HDL Ratio: 6.2 ratio — ABNORMAL HIGH (ref 0.0–4.4)
Cholesterol, Total: 206 mg/dL — ABNORMAL HIGH (ref 100–199)
HDL: 33 mg/dL — ABNORMAL LOW (ref >39)
LDL Chol Calc (NIH): 138 mg/dL — ABNORMAL HIGH (ref 0–99)
Triglycerides: 195 mg/dL — ABNORMAL HIGH (ref 0–149)
VLDL Cholesterol Cal: 35 mg/dL (ref 5–40)

## 2020-12-14 ENCOUNTER — Telehealth: Payer: Self-pay

## 2020-12-14 NOTE — Telephone Encounter (Signed)
Patient name and DOB has been verified Patient was informed of lab results. Patient had no questions.  

## 2020-12-14 NOTE — Telephone Encounter (Signed)
-----   Message from Hoy Register, MD sent at 12/13/2020 10:40 AM EDT ----- Please inform her that kidney function stable, liver function is normal, cholesterol is slightly elevated.  Hopefully restarting her cholesterol medication will help with this along with a low-cholesterol diet.

## 2020-12-15 ENCOUNTER — Ambulatory Visit: Payer: Medicare Other

## 2020-12-15 ENCOUNTER — Encounter: Payer: Self-pay | Admitting: Family

## 2020-12-15 ENCOUNTER — Ambulatory Visit: Payer: Medicare Other | Attending: Family | Admitting: Family

## 2020-12-15 ENCOUNTER — Other Ambulatory Visit: Payer: Self-pay

## 2020-12-15 VITALS — BP 180/81 | HR 80 | Resp 18 | Ht 64.8 in | Wt 215.0 lb

## 2020-12-15 DIAGNOSIS — R2681 Unsteadiness on feet: Secondary | ICD-10-CM

## 2020-12-15 DIAGNOSIS — M6281 Muscle weakness (generalized): Secondary | ICD-10-CM

## 2020-12-15 DIAGNOSIS — Z0000 Encounter for general adult medical examination without abnormal findings: Secondary | ICD-10-CM | POA: Diagnosis not present

## 2020-12-15 DIAGNOSIS — M1A00X0 Idiopathic chronic gout, unspecified site, without tophus (tophi): Secondary | ICD-10-CM | POA: Diagnosis not present

## 2020-12-15 DIAGNOSIS — E1142 Type 2 diabetes mellitus with diabetic polyneuropathy: Secondary | ICD-10-CM

## 2020-12-15 MED ORDER — COLCHICINE 0.6 MG PO TABS: ORAL_TABLET | ORAL | 1 refills | Status: DC

## 2020-12-15 MED ORDER — ALLOPURINOL 100 MG PO TABS: ORAL_TABLET | ORAL | 1 refills | Status: DC

## 2020-12-15 NOTE — Progress Notes (Signed)
Subjective:   Mariah Mason is a 70 y.o. female who presents for an Initial Medicare Annual Wellness Visit.  Review of Systems    WNL Cardiac Risk Factors include: advanced age (>40mn, >>60women);diabetes mellitus;dyslipidemia;hypertension;sedentary lifestyle     Objective:    Today's Vitals   12/15/20 1559 12/15/20 1607 12/15/20 1642  BP: (!) 176/92  (!) 180/81  Pulse: 80    Resp: 18    SpO2: 100%    Weight: 215 lb (97.5 kg)    Height: 5' 4.8" (1.646 m)    PainSc: 0-No pain 0-No pain    Body mass index is 36 kg/m.  Advanced Directives 08/30/2020 08/23/2020 08/16/2020 08/08/2020 04/05/2020 04/03/2020 09/20/2019  Does Patient Have a Medical Advance Directive? No No No No No No No  Does patient want to make changes to medical advance directive? - No - Patient declined - - - - -  Would patient like information on creating a medical advance directive? No - Patient declined - No - Patient declined No - Patient declined - - -    Current Medications (verified) Outpatient Encounter Medications as of 12/15/2020  Medication Sig   amLODipine (NORVASC) 10 MG tablet TAKE 1 TABLET(10 MG) BY MOUTH DAILY   atorvastatin (LIPITOR) 20 MG tablet Take 1 tablet (20 mg total) by mouth daily.   blood glucose meter kit and supplies KIT Dispense based on patient and insurance preference. Use up to four times daily as directed. (FOR ICD-9 250.00, 250.01).   carvedilol (COREG) 25 MG tablet TAKE 1 TABLET(25 MG) BY MOUTH TWICE DAILY WITH A MEAL   cholecalciferol (VITAMIN D3) 25 MCG (1000 UT) tablet Take 1,000 Units by mouth daily.   cycloSPORINE (RESTASIS) 0.05 % ophthalmic emulsion Place 1 drop into both eyes 2 (two) times daily.    Elastic Bandages & Supports (MEDICAL COMPRESSION SOCKS) MISC Compression socks for DM peripheral neuropathy and bilateral leg swelling   hydrALAZINE (APRESOLINE) 25 MG tablet Take 1 tablet (25 mg total) by mouth 2 (two) times daily.   Misc. Devices MWabashaShower chair. Dx- Diabetes  mellitus   Multiple Vitamins-Minerals (CENTRUM SILVER ADULT 50+ PO) Take 1 tablet by mouth daily.   NONFORMULARY OR COMPOUNDED ITEM Peripheral Neuropathy Cream: Bupivacaine 1%, Doxepin 3%, Gabapentin 6%, Pentoxifylline 3%, Topiramate 1% [place] to [facility](FISH [facility]) 1200 MG CAPS Take by mouth.   sitaGLIPtin (JANUVIA) 50 MG tablet TAKE 1 TABLET(50 MG) BY MOUTH DAILY   [DISCONTINUED] allopurinol (ZYLOPRIM) 100 MG tablet TAKE 1 TABLET(100 MG) BY MOUTH DAILY   [DISCONTINUED] colchicine 0.6 MG tablet TAKE 2 TABLETS(1.2 MG) BY MOUTH AT ONSET FOR GOUT. MAY REPEAT 1 TABLET 0.6 MG IN 2 HOURS IF SYMPTOMS PERSIST   allopurinol (ZYLOPRIM) 100 MG tablet TAKE 1 TABLET(100 MG) BY MOUTH DAILY   colchicine 0.6 MG tablet Take 2 tablets (1.262m by mouth at onset for gout, may repeat 1 tablet 0.12m23mn 2 hours is symptoms persist   ezetimibe (ZETIA) 10 MG tablet Take 1 tablet (10 mg total) by mouth daily. (Patient not taking: Reported on 12/15/2020)   No facility-administered encounter medications on file as of 12/15/2020.    Allergies (verified) Erythromycin   History: Past Medical History:  Diagnosis Date   Diabetes mellitus without complication ([facility]  Gout    Hyperlipemia    Hypertension    Neuropathy    Past Surgical History:  Procedure Laterality Date   CYST EXCISION N/A 08/30/2020   Procedure:  EXCISION 1X2 cm LEFT NECK SEBACEOUS CYST EXCISION 2X2 CM RIGHT BACK SEBACEOUS CYST;  Surgeon: Rolm Bookbinder, MD;  Location: [facility];  Service: General;  Laterality: N/A;   THYROID SURGERY     Family History  Problem Relation Age of Onset   Heart attack Mother    Kidney failure Father    Social History   Socioeconomic History   Marital status: Single    Spouse name: Not on file   Number of children: 6   Years of education: 9th grade   Highest education level: Not on file  Occupational History   Occupation: Retired  Tobacco Use    Smoking status: Never   Smokeless tobacco: Never  Vaping Use   Vaping Use: Never used  Substance and Sexual Activity   Alcohol use: Never   Drug use: Never   Sexual activity: Not on file  Other Topics Concern   Not on file  Social History Narrative   Lives with family.   Right-handed.   No daily use of caffeine.   Social Determinants of Health   Financial Resource Strain: Not on file  Food Insecurity: Not on file  Transportation Needs: Not on file  Physical Activity: Not on file  Stress: Not on file  Social Connections: Not on file    Tobacco Counseling Counseling given: Not Answered   Clinical Intake:     Pain : 0-10 Pain Score: 0-No pain     Diabetes: Yes (BG 172m/dl in the morning at home) CBG done?: Yes (subjective) CBG resulted in Enter/ Edit results?: No Did pt. bring in CBG monitor from home?: No  How often do you need to have someone help you when you read instructions, pamphlets, or other written materials from your doctor or pharmacy?: 3 - Sometimes What is the last grade level you completed in school?: 9th grade  Diabetic yes     Comments: None Information entered by :: JFeliberto Gottron APRN, FNP-C   Activities of Daily Living In your present state of health, do you have any difficulty performing the following activities: 12/15/2020 08/30/2020  Hearing? N N  Vision? N N  Difficulty concentrating or making decisions? N N  Walking or climbing stairs? N Y  Dressing or bathing? N N  Doing errands, shopping? N -  Preparing Food and eating ? N -  Using the Toilet? N -  In the past six months, have you accidently leaked urine? N -  Do you have problems with loss of bowel control? N -  Managing your Medications? N -  Managing your Finances? N -  Housekeeping or managing your Housekeeping? N -  Some recent data might be hidden    Patient Care Team: NCharlott Rakes MD as PCP - General (Family Medicine) GMarzetta Board DPM as Consulting  Physician (Podiatry)  Indicate any recent Medical Services you may have received from other than [facility] providers in the past year (date may be approximate).     Assessment:   This is a routine wellness examination for Mariah Mason.  Hearing/Vision screen No results found.  Dietary issues and exercise activities discussed: Current Exercise Habits: The patient does not participate in regular exercise at present, Exercise limited by: None identified   Goals Addressed   None   Depression Screen PHQ 2/9 Scores 12/15/2020 11/23/2020 08/25/2020 02/11/2020 11/11/2019 08/12/2019  PHQ - 2 Score 0 0 0 0 0 0  PHQ- 9 Score 0 0 - 0 0 -    Fall  Risk Fall Risk  12/15/2020 11/11/2019 08/12/2019 06/30/2019 06/23/2019  Falls in the past year? 0 0 0 0 0  Number falls in past yr: 0 - - - -  Injury with Fall? 0 - - - -  Risk for fall due to : - No Fall Risks - - -    FALL RISK PREVENTION PERTAINING TO THE HOME:  Any stairs in or around the home? No  If so, are there any without handrails? No  Home free of loose throw rugs in walkways, pet beds, electrical cords, etc? Yes  Adequate lighting in your home to reduce risk of falls? Yes   ASSISTIVE DEVICES UTILIZED TO PREVENT FALLS:  Life alert? No  Use of a cane, walker or w/c? Yes  Grab bars in the bathroom? No  Shower chair or bench in shower? Yes  Elevated toilet seat or a handicapped toilet? No   TIMED UP AND GO:  Was the test performed? Yes .  Length of time to ambulate 10 feet: less than 5 sec.   Gait steady and fast without use of assistive device  Cognitive Function:     6CIT Screen 12/15/2020  What Year? 0 points  What month? 0 points  What time? 3 points  Count back from 20 0 points  Months in reverse 4 points  Repeat phrase 2 points  Total Score 9    Immunizations Immunization History  Administered Date(s) Administered   Pneumococcal Conjugate-13 08/12/2019    TDAP status: Up to date  Flu Vaccine status: Due, Education has been  provided regarding the importance of this vaccine. Advised may receive this vaccine at local pharmacy or Health Dept. Aware to provide a copy of the vaccination record if obtained from local pharmacy or Health Dept. Verbalized acceptance and understanding.  Pneumococcal vaccine status: Up to date  Covid-19 vaccine status: Information provided on how to obtain vaccines.   Qualifies for Shingles Vaccine? No   Zostavax completed No   Shingrix Completed?: No.    Education has been provided regarding the importance of this vaccine. Patient has been advised to call insurance company to determine out of pocket expense if they have not yet received this vaccine. Advised may also receive vaccine at local pharmacy or Health Dept. Verbalized acceptance and understanding.  Screening Tests Health Maintenance  Topic Date Due   COVID-19 Vaccine (1) Never done   OPHTHALMOLOGY EXAM  Never done   Hepatitis C Screening  Never done   TETANUS/TDAP  Never done   Zoster Vaccines- Shingrix (1 of 2) Never done   PNA vac Low Risk Adult (2 of 2 - PPSV23) 08/11/2020   FOOT EXAM  10/04/2020   INFLUENZA VACCINE  11/28/2020   HEMOGLOBIN A1C  05/26/2021   MAMMOGRAM  01/10/2022   Fecal DNA (Cologuard)  08/24/2022   DEXA SCAN  Completed   HPV VACCINES  Aged Out    Health Maintenance  Health Maintenance Due  Topic Date Due   COVID-19 Vaccine (1) Never done   OPHTHALMOLOGY EXAM  Never done   Hepatitis C Screening  Never done   TETANUS/TDAP  Never done   Zoster Vaccines- Shingrix (1 of 2) Never done   PNA vac Low Risk Adult (2 of 2 - PPSV23) 08/11/2020   FOOT EXAM  10/04/2020   INFLUENZA VACCINE  11/28/2020    Colorectal cancer screening: No longer required.   Mammogram status: Ordered September, 2022. Pt provided with contact info and advised to call to schedule appt.  Bone Density status: Ordered  . Pt provided with contact info and advised to call to schedule appt.  Lung Cancer Screening: (Low Dose CT  Chest recommended if Age 73-80 years, 30 pack-year currently smoking OR have quit w/in 15years.) does not qualify.   Lung Cancer Screening Referral: None  Additional Screening:  Hepatitis C Screening: does not qualify; Completed yes  Vision Screening: Recommended annual ophthalmology exams for early detection of glaucoma and other disorders of the eye. Is the patient up to date with their annual eye exam?  Yes  Who is the provider or what is the name of the office in which the patient attends annual eye exams? yes If pt is not established with a provider, would they like to be referred to a provider to establish care?  Established with a PCP .   Dental Screening: Recommended annual dental exams for proper oral hygiene  Community Resource Referral / Chronic Care Management: CRR required this visit?  No   CCM required this visit?  No      Plan:     I have personally reviewed and noted the following in the patient's chart:   Medical and social history Use of alcohol, tobacco or illicit drugs  Current medications and supplements including opioid prescriptions. Patient is not currently taking opioid prescriptions. Functional ability and status Nutritional status Physical activity Advanced directives List of other physicians Hospitalizations, surgeries, and ER visits in previous 12 months Vitals Screenings to include cognitive, depression, and falls Referrals and appointments  In addition, I have reviewed and discussed with patient certain preventive protocols, quality metrics, and best practice recommendations. A written personalized care plan for preventive services as well as general preventive health recommendations were provided to patient.     Feliberto Gottron, FNP   12/15/2020   Nurse Notes: n/a

## 2020-12-16 NOTE — Therapy (Signed)
[facility] [address] [place], [place], [address] Phone: [phone]   Fax:  [phone]  Physical Therapy Treatment  Patient Details  Name: Mariah Mason MRN: [id number] Date of Birth: 26-Jan-1951 Referring Provider (PT): Dr. Krista Blue   Encounter Date: 12/15/2020  PHYSICAL THERAPY DISCHARGE SUMMARY  Visits from Start of Care: 6  Current functional level related to goals / functional outcomes: Goals met, patient ready for independent management   Remaining deficits: Endurance, stair climbing   Education / Equipment: HEP issued and reviewed  Patient agrees to discharge. Patient goals were met. Patient is being discharged due to being pleased with the current functional level.    PT End of Session - 12/15/20 1706     Visit Number 6    Number of Visits 6    Date for PT Re-Evaluation 12/22/20    Authorization Type medicare/medicaid    PT Start Time 1700    PT Stop Time 1745    PT Time Calculation (min) 45 min    Equipment Utilized During Treatment Gait belt    Activity Tolerance Patient tolerated treatment well    Behavior During Therapy WFL for tasks assessed/performed             Past Medical History:  Diagnosis Date   Diabetes mellitus without complication ([place])    Gout    Hyperlipemia    Hypertension    Neuropathy     Past Surgical History:  Procedure Laterality Date   CYST EXCISION N/A 08/30/2020   Procedure: EXCISION 1X2 cm LEFT NECK SEBACEOUS CYST EXCISION 2X2 CM RIGHT BACK SEBACEOUS CYST;  Surgeon: Rolm Bookbinder, MD;  Location: [facility];  Service: General;  Laterality: N/A;   THYROID SURGERY      There were no vitals filed for this visit.   Subjective Assessment - 12/15/20 1704     Subjective No pain to note, no falls or med changes to date, ready for DC.    Patient is accompained by: Family member    Pertinent History Long history of diabetes, less dependent  sensory changes, evidence of diabetic peripheral neuropathy, lower extremity pitting edema, likely all contributed to her complaints of lower extremity feet pulling sensation,              Gait abnormality are combination of her bilateral knee valgus, flatfeet, diabetic peripheral neuropathy, overweight              EMG nerve conduction study              Referral to physical therapy              Laboratory evaluation to rule out other treatable cause for neuropathy    Limitations Standing;Walking    How long can you sit comfortably? ulimited    How long can you stand comfortably? <10 min    How long can you walk comfortably? <10 min              12/15/20 0001  Transfers  Transfers Sit to Stand;Stand to Sit  Sit to Stand 6: Modified independent (Device/Increase time)  Comments UE support  Berg Balance Test  Sit to Stand 3  Standing Unsupported 4  Sitting with Back Unsupported but Feet Supported on Floor or Stool 4  Stand to Sit 4  Transfers 4  Standing Unsupported with Eyes Closed 4  Standing Unsupported with Feet Together 4  From Standing, Reach Forward with Outstretched Arm 4  From  Standing Position, Pick up Object from Floor 4  From Standing Position, Turn to Look Behind Over each Shoulder 4  Turn 360 Degrees 2  Standing Unsupported, Alternately Place Feet on Step/Stool 3  Standing Unsupported, One Foot in Front 3  Standing on One Leg 2  Total Score 49  Dynamic Gait Index  Level Surface 3  Change in Gait Speed 3  Gait with Horizontal Head Turns 3  Gait with Vertical Head Turns 3  Gait and Pivot Turn 3  Step Over Obstacle 1  Step Around Obstacles 2  Steps 2  Total Score 20     12/15/20 0001  Transfers  Transfers Sit to Stand;Stand to Sit  Sit to Stand 6: Modified independent (Device/Increase time)  Comments UE support                            PT Education - 12/15/20 2876     Education Details Questions and concerns regarding DC  discussed    Person(s) Educated Patient;Child(ren)    Methods Explanation    Comprehension Verbalized understanding              PT Short Term Goals - 12/09/20 0815       PT SHORT TERM GOAL #1   Title Patient to demo I in [place]; 12/09/20 Patient I in initial HEP    Time 2    Period Weeks    Status Achieved    Target Date 12/08/20      PT SHORT TERM GOAL #2   Title patient to negotiate 4 steps with most apropriate pattern    Baseline TBD/ 12/09/20 Able to negotiate 4 seps with single rail, cane and step to pattern    Time 2    Period Weeks    Status Achieved    Target Date 12/08/20               PT Long Term Goals - 12/15/20 1725       PT LONG TERM GOAL #1   Title patient to demo final HEP back to PT w/o need of cuing    Baseline initial HEP isuued; 12/15/20 Patient I in final HEP    Time 4    Period Weeks    Status Achieved      PT LONG TERM GOAL #2   Title Patient to increase DGI score to 20    Baseline Initial DGI 16; 12/15/20 DGI score 20    Time 4    Period Weeks    Status Achieved      PT LONG TERM GOAL #3   Title Patient to ambulate 235f with LRAD across level ground under S    Baseline 1110fin clinic distances with cane and CG assist patient able to ambuate 25039fn clinic with and without cane with S    Time 4    Period Weeks    Status Achieved      PT LONG TERM GOAL #4   Title Increase BERG score to 47    Baseline Initial BERG score 43; 12/15/20 BERG score 49    Time 4    Period Weeks    Status Achieved      PT LONG TERM GOAL #5   Period Weeks    Status New                   Plan - 12/15/20 [id number]  Clinical Impression Statement Todays session consisted of assessing remaining goals and appropriateness for DC.  All goals met and patient ready for independent management    Personal Factors and Comorbidities Age;Comorbidity 2    Comorbidities DM, neuropathy    Examination-Activity Limitations Locomotion  Level    Stability/Clinical Decision Making Stable/Uncomplicated    Rehab Potential Good    PT Frequency 1x / week    PT Duration 8 weeks    PT Treatment/Interventions ADLs/Self Care Home Management;Aquatic Therapy;DME Instruction;Gait training;Stair training;Functional mobility training;Therapeutic activities;Therapeutic exercise;Balance training;Neuromuscular re-education;Patient/family education;Orthotic Fit/Training    PT Next Visit Plan DC to HEP    PT Home Exercise Plan [id number]    Consulted and Agree with Plan of Care Patient             Patient will benefit from skilled therapeutic intervention in order to improve the following deficits and impairments:  Abnormal gait, Decreased endurance, Decreased activity tolerance, Decreased strength, Decreased balance, Decreased mobility, Difficulty walking, Decreased range of motion, Decreased coordination  Visit Diagnosis: Unsteadiness on feet  Muscle weakness (generalized)  DM type 2 with diabetic peripheral neuropathy [facility])     Problem List Patient Active Problem List   Diagnosis Date Noted   Bilateral lumbar radiculopathy 08/22/2020   DM type 2 with diabetic peripheral neuropathy ([place]) 08/02/2020   Gait abnormality 08/02/2020   Gout    Hypertension    Type 2 diabetes mellitus ([place]) 02/18/2019   AKI (acute kidney injury) ([place]) 02/18/2019   Hyperkalemia 46/96/2952   Metabolic acidosis 84/13/2440   Hypoglycemia 02/17/2019   Long term current use of oral hypoglycemic drug 05/23/2017   Presbyopia of both eyes 05/23/2017   Vitreous floaters of both eyes 05/23/2017   Keratoconjunctivitis sicca of both eyes not specified as Sjogren's 10/16/2016   Nuclear sclerotic cataract of both eyes 10/16/2016    Lanice Shirts 12/16/2020, 12:07 PM  [facility] [address] [place], [place], [address] Phone: [phone]   Fax:  [phone]  Name: Mariah Mason MRN:  [id number] Date of Birth: 11/30/1950

## 2020-12-23 ENCOUNTER — Other Ambulatory Visit: Payer: Self-pay | Admitting: Family Medicine

## 2020-12-23 DIAGNOSIS — E1169 Type 2 diabetes mellitus with other specified complication: Secondary | ICD-10-CM

## 2020-12-23 NOTE — Telephone Encounter (Signed)
Requested medication (s) are due for refill today:   No  Requesting refill too soon  Requested medication (s) are on the active medication list:   Yes  Future visit scheduled:   No   Last ordered: 11/23/2020 #30, 3 refills  Returned because pharmacy requesting #90 day supply.  The note about keeping her appt looks like an older note however does Dr. Alvis Lemmings want to see this pt again before refills given in future?   Thanks.   Requested Prescriptions  Pending Prescriptions Disp Refills   ezetimibe (ZETIA) 10 MG tablet [Pharmacy Med Name: EZETIMIBE 10MG  TABLETS] 90 tablet     Sig: TAKE 1 TABLET(10 MG) BY MOUTH DAILY     Cardiovascular:  Antilipid - Sterol Transport Inhibitors Failed - 12/23/2020  7:19 AM      Failed - Total Cholesterol in normal range and within 360 days    Cholesterol, Total  Date Value Ref Range Status  12/09/2020 206 (H) 100 - 199 mg/dL Final          Failed - LDL in normal range and within 360 days    LDL Chol Calc (NIH)  Date Value Ref Range Status  12/09/2020 138 (H) 0 - 99 mg/dL Final          Failed - HDL in normal range and within 360 days    HDL  Date Value Ref Range Status  12/09/2020 33 (L) >39 mg/dL Final          Failed - Triglycerides in normal range and within 360 days    Triglycerides  Date Value Ref Range Status  12/09/2020 195 (H) 0 - 149 mg/dL Final          Passed - Valid encounter within last 12 months    Recent Outpatient Visits           1 month ago Type 2 diabetes mellitus with other neurologic complication, with long-term current use of insulin (HCC)   [facility] [place], [place], MD   7 months ago Neck mass   [facility] [place], [place], MD   10 months ago Type 2 diabetes mellitus with other neurologic complication, with long-term current use of insulin (HCC)   [facility] [place], [place], MD   1 year ago Type 2 diabetes mellitus with  other specified complication, with long-term current use of insulin (HCC)   [facility] [place], [place], MD   1 year ago Type 2 diabetes mellitus with other specified complication, with long-term current use of insulin (HCC)   [facility], MD

## 2020-12-27 ENCOUNTER — Ambulatory Visit: Payer: Medicare Other

## 2021-01-12 LAB — HM MAMMOGRAPHY

## 2021-02-02 ENCOUNTER — Other Ambulatory Visit: Payer: Self-pay | Admitting: Family Medicine

## 2021-02-02 DIAGNOSIS — Z794 Long term (current) use of insulin: Secondary | ICD-10-CM

## 2021-02-07 ENCOUNTER — Other Ambulatory Visit: Payer: Self-pay

## 2021-02-07 ENCOUNTER — Encounter: Payer: Self-pay | Admitting: Podiatry

## 2021-02-07 ENCOUNTER — Ambulatory Visit (INDEPENDENT_AMBULATORY_CARE_PROVIDER_SITE_OTHER): Payer: Medicare Other | Admitting: Podiatry

## 2021-02-07 DIAGNOSIS — M79609 Pain in unspecified limb: Secondary | ICD-10-CM

## 2021-02-07 DIAGNOSIS — E1142 Type 2 diabetes mellitus with diabetic polyneuropathy: Secondary | ICD-10-CM

## 2021-02-07 DIAGNOSIS — B351 Tinea unguium: Secondary | ICD-10-CM

## 2021-02-11 NOTE — Progress Notes (Addendum)
  Subjective:  Patient ID: Mariah Mason, female    DOB: [birth year],  MRN: [id number]  Mariah Mason presents to clinic today for at risk foot care with history of diabetic neuropathy and thick, elongated toenails b/l feet which are tender when wearing enclosed shoe gear.  Patient states her blood glucose was 120 mg/dl.   Patient states she did get her compression hose from [facility] in [place].  Allergies  Allergen Reactions   Erythromycin Other (See Comments)    Does not remember reaction.    Review of Systems: Negative except as noted in the HPI. Objective:   Constitutional Mariah Mason is a pleasant 70 y.o. African American female, in NAD. AAO x 3.   Vascular Capillary refill time to digits immediate b/l. Palpable DP pulse(s) b/l lower extremities Palpable PT pulse(s) b/l lower extremities Pedal hair sparse. Lower extremity skin temperature gradient within normal limits. No pain with calf compression b/l. Trace edema noted b/l lower extremities. No cyanosis or clubbing noted.  Neurologic Normal speech. Oriented to person, place, and time. Pt has subjective symptoms of neuropathy. Protective sensation intact 5/5 intact bilaterally with 10g monofilament b/l. Proprioception intact bilaterally.  Dermatologic Skin warm and supple b/l lower extremities. No open wounds b/l LE. Toenails 1-5 b/l elongated, discolored, dystrophic, thickened, crumbly with subungual debris and tenderness to dorsal palpation.  Orthopedic: Normal muscle strength 5/5 to all lower extremity muscle groups bilaterally. Hammertoe(s) noted to the b/l lower extremities. Utilizes cane for ambulation assistance.   Radiographs: None Assessment:   1. Pain due to onychomycosis of nail   2. Diabetic peripheral neuropathy associated with type 2 diabetes mellitus (HCC)    Plan:  Patient was evaluated and treated and all questions answered. Consent given for treatment as described below: -Examined patient. -She  has obtained her compression hose and wears daily. -Continue diabetic foot care principles: inspect feet daily, monitor glucose as recommended by PCP and/or Endocrinologist, and follow prescribed diet per PCP, Endocrinologist and/or dietician. -Patient to continue soft, supportive shoe gear daily. -Toenails 1-5 b/l were debrided in length and girth with sterile nail nippers and dremel without iatrogenic bleeding.  -Patient to report any pedal injuries to medical professional immediately. -Patient/POA to call should there be question/concern in the interim.  Return in about 3 months (around 05/10/2021).  Freddie Breech, DPM

## 2021-02-24 ENCOUNTER — Other Ambulatory Visit: Payer: Self-pay

## 2021-02-24 ENCOUNTER — Encounter (HOSPITAL_BASED_OUTPATIENT_CLINIC_OR_DEPARTMENT_OTHER): Payer: Self-pay | Admitting: Emergency Medicine

## 2021-02-24 DIAGNOSIS — I10 Essential (primary) hypertension: Secondary | ICD-10-CM | POA: Insufficient documentation

## 2021-02-24 DIAGNOSIS — E119 Type 2 diabetes mellitus without complications: Secondary | ICD-10-CM | POA: Insufficient documentation

## 2021-02-24 DIAGNOSIS — R42 Dizziness and giddiness: Secondary | ICD-10-CM | POA: Insufficient documentation

## 2021-02-24 DIAGNOSIS — I158 Other secondary hypertension: Secondary | ICD-10-CM | POA: Insufficient documentation

## 2021-02-24 DIAGNOSIS — Z79899 Other long term (current) drug therapy: Secondary | ICD-10-CM | POA: Insufficient documentation

## 2021-02-24 NOTE — ED Triage Notes (Signed)
Pt state dizziness and possible hypertension. States took cough meds this morning for congestion.

## 2021-02-25 ENCOUNTER — Emergency Department (HOSPITAL_BASED_OUTPATIENT_CLINIC_OR_DEPARTMENT_OTHER)
Admission: EM | Admit: 2021-02-25 | Discharge: 2021-02-25 | Disposition: A | Discharge status: Home / Self Care | Payer: Medicare Other | Attending: Emergency Medicine | Admitting: Emergency Medicine

## 2021-02-25 DIAGNOSIS — R42 Dizziness and giddiness: Secondary | ICD-10-CM | POA: Diagnosis not present

## 2021-02-25 DIAGNOSIS — I158 Other secondary hypertension: Secondary | ICD-10-CM

## 2021-02-25 NOTE — ED Provider Notes (Signed)
[facility] EMERGENCY DEPARTMENT Provider Note  CSN: [id number] Arrival date & time: 02/24/21 2327  Chief Complaint(s) Dizziness and Hypertension  HPI Mariah Mason is a 70 y.o. female    Dizziness Quality:  Room spinning (last episode was this morning) Severity:  Mild Onset quality:  Sudden Duration: episodes last a few seconds to minutes at a time. Timing:  Sporadic Progression:  Resolved Chronicity:  Recurrent Context: physical activity and standing up   Relieved by:  Being still Worsened by:  Movement Associated symptoms: no chest pain, no diarrhea, no headaches, no hearing loss, no nausea, no palpitations, no shortness of breath, no syncope, no tinnitus, no vision changes, no vomiting and no weakness   Hypertension Chronicity: noted BP of 190 at 10 p. Pertinent negatives include no chest pain, no headaches and no shortness of breath.   Past Medical History Past Medical History:  Diagnosis Date   Diabetes mellitus without complication ([place])    Gout    Hyperlipemia    Hypertension    Neuropathy    Patient Active Problem List   Diagnosis Date Noted   Bilateral lumbar radiculopathy 08/22/2020   DM type 2 with diabetic peripheral neuropathy ([place]) 08/02/2020   Gait abnormality 08/02/2020   Gout    Hypertension    Type 2 diabetes mellitus ([place]) 02/18/2019   AKI (acute kidney injury) ([place]) 02/18/2019   Hyperkalemia 51/05/5850   Metabolic acidosis 77/82/4235   Hypoglycemia 02/17/2019   Long term current use of oral hypoglycemic drug 05/23/2017   Presbyopia of both eyes 05/23/2017   Vitreous floaters of both eyes 05/23/2017   Keratoconjunctivitis sicca of both eyes not specified as Sjogren's 10/16/2016   Nuclear sclerotic cataract of both eyes 10/16/2016   Home Medication(s) Prior to Admission medications   Medication Sig Start Date End Date Taking? Authorizing Provider  allopurinol (ZYLOPRIM) 100 MG tablet TAKE 1 TABLET(100 MG) BY MOUTH DAILY 12/15/20    Feliberto Gottron, FNP  amLODipine (NORVASC) 10 MG tablet TAKE 1 TABLET(10 MG) BY MOUTH DAILY 08/25/20   Mayers, Cari S, PA-C  atorvastatin (LIPITOR) 20 MG tablet Take 1 tablet (20 mg total) by mouth daily. 11/23/20   Charlott Rakes, MD  blood glucose meter kit and supplies KIT Dispense based on patient and insurance preference. Use up to four times daily as directed. (FOR ICD-9 250.00, 250.01). 02/20/19   Donne Hazel, MD  carvedilol (COREG) 25 MG tablet TAKE 1 TABLET(25 MG) BY MOUTH TWICE DAILY WITH A MEAL 11/23/20   Charlott Rakes, MD  cholecalciferol (VITAMIN D3) 25 MCG (1000 UT) tablet Take 1,000 Units by mouth daily.    [provider]  colchicine 0.6 MG tablet Take 2 tablets (1.$RemoveBefo'2mg'hAxDYJWgnBG$ ) by mouth at onset for gout, may repeat 1 tablet 0.$RemoveBe'6mg'FvPZTVJka$  in 2 hours is symptoms persist 12/15/20   Feliberto Gottron, FNP  cycloSPORINE (RESTASIS) 0.05 % ophthalmic emulsion Place 1 drop into both eyes 2 (two) times daily.  12/05/18   [provider]  Elastic Bandages & Supports (MEDICAL COMPRESSION SOCKS) MISC Compression socks for DM peripheral neuropathy and bilateral leg swelling 08/02/20   Marcial Pacas, MD  ezetimibe (ZETIA) 10 MG tablet TAKE 1 TABLET(10 MG) BY MOUTH DAILY 12/27/20   Charlott Rakes, MD  hydrALAZINE (APRESOLINE) 25 MG tablet Take 1 tablet (25 mg total) by mouth 2 (two) times daily. 11/23/20   Charlott Rakes, MD  Misc. Devices [place] [place] Shower chair. Dx- Diabetes mellitus 08/12/19   Charlott Rakes, MD  Multiple Vitamins-Minerals (CENTRUM SILVER ADULT 50+  PO) Take 1 tablet by mouth daily.    [provider]  NONFORMULARY OR COMPOUNDED ITEM Peripheral Neuropathy Cream: Bupivacaine 1%, Doxepin 3%, Gabapentin 6%, Pentoxifylline 3%, Topiramate 1% Order fax[facility] to [facility] 10/05/19   [name], DPM  Omega-3 Fatty Acids (FISH OIL) 1200 MG CAPS Take by mouth.    [provider]  sitaGLIPtin (JANUVIA) 50 MG tablet TAKE 1 TABLET(50 MG) BY MOUTH DAILY 02/02/21    Charlott Rakes, MD                                                                                                                                    Past Surgical History Past Surgical History:  Procedure Laterality Date   CYST EXCISION N/A 08/30/2020   Procedure: EXCISION 1X2 cm LEFT NECK SEBACEOUS CYST EXCISION 2X2 CM RIGHT BACK SEBACEOUS CYST;  Surgeon: Rolm Bookbinder, MD;  Location: [facility];  Service: General;  Laterality: N/A;   THYROID SURGERY     Family History Family History  Problem Relation Age of Onset   Heart attack Mother    Kidney failure Father     Social History Social History   Tobacco Use   Smoking status: Never   Smokeless tobacco: Never  Vaping Use   Vaping Use: Never used  Substance Use Topics   Alcohol use: Never   Drug use: Never   Allergies Erythromycin  Review of Systems Review of Systems  HENT:  Negative for hearing loss and tinnitus.   Respiratory:  Negative for shortness of breath.   Cardiovascular:  Negative for chest pain, palpitations and syncope.  Gastrointestinal:  Negative for diarrhea, nausea and vomiting.  Neurological:  Positive for dizziness. Negative for weakness and headaches.  All other systems are reviewed and are negative for acute change except as noted in the HPI  Physical Exam Vital Signs  I have reviewed the triage vital signs BP (!) 149/78 (BP Location: Left Arm)   Pulse 85   Temp 98.7 F (37.1 C) (Oral)   Resp 20   Ht 5' 2.5" (1.588 m)   Wt 94.3 kg   SpO2 99%   BMI 37.44 kg/m   Physical Exam Vitals reviewed.  Constitutional:      General: She is not in acute distress.    Appearance: She is well-developed. She is not diaphoretic.  HENT:     Head: Normocephalic and atraumatic.     Nose: Nose normal.  Eyes:     General: No scleral icterus.       Right eye: No discharge.        Left eye: No discharge.     Conjunctiva/sclera: Conjunctivae normal.     Pupils: Pupils are equal, round,  and reactive to light.  Cardiovascular:     Rate and Rhythm: Normal rate and regular rhythm.     Heart sounds: No murmur heard.   No friction rub. No  gallop.  Pulmonary:     Effort: Pulmonary effort is normal. No respiratory distress.     Breath sounds: Normal breath sounds. No stridor. No rales.  Abdominal:     General: There is no distension.     Palpations: Abdomen is soft.     Tenderness: There is no abdominal tenderness.  Musculoskeletal:        General: No tenderness.     Cervical back: Normal range of motion and neck supple.  Skin:    General: Skin is warm and dry.     Findings: No erythema or rash.  Neurological:     Mental Status: She is alert and oriented to person, place, and time.     Comments: Mental Status:  Alert and oriented to person, place, and time.  Attention and concentration normal.  Speech clear.  Recent memory is intact  Cranial Nerves:  II Visual Fields: Intact to confrontation. Visual fields intact. III, IV, VI: Pupils equal and reactive to light and near. Full eye movement without nystagmus  V Facial Sensation: Normal. No weakness of masticatory muscles  VII: No facial weakness or asymmetry  VIII Auditory Acuity: Grossly normal  IX/X: The uvula is midline; the palate elevates symmetrically  XI: Normal sternocleidomastoid and trapezius strength  XII: The tongue is midline. No atrophy or fasciculations.   Motor System: Muscle Strength: 5/5 and symmetric in the upper and lower extremities. No pronation or drift.  Muscle Tone: Tone and muscle bulk are normal in the upper and lower extremities.   Coordination: Intact finger-to-nose. No tremor.  Sensation: Intact to light touch, and pinprick. Negative Romberg test.  Gait: Routine gait normal.     ED Results and Treatments Labs (all labs ordered are listed, but only abnormal results are displayed) Labs Reviewed - No data to display                                                                                                                        EKG  EKG Interpretation  Date/Time:  Saturday February 25 2021 00:55:57 EDT Ventricular Rate:  85 PR Interval:  162 QRS Duration: 76 QT Interval:  360 QTC Calculation: 428 R Axis:   39 Text Interpretation: Normal sinus rhythm Normal ECG No acute changes Confirmed by Addison Lank ([id number]) on 02/25/2021 1:02:00 AM       Radiology No results found.  Pertinent labs & imaging results that were available during my care of the patient were reviewed by me and considered in my medical decision making (see MDM for details).  Medications Ordered in ED Medications - No data to display  Procedures Procedures  (including critical care time)  Medical Decision Making / ED Course I have reviewed the nursing notes for this encounter and the patient's prior records (if available in EHR or on provided paperwork).  Mariah Mason was evaluated in Emergency Department on 02/25/2021 for the symptoms described in the history of present illness. She was evaluated in the context of the global COVID-19 pandemic, which necessitated consideration that the patient might be at risk for infection with the SARS-CoV-2 virus that causes COVID-19. Institutional protocols and algorithms that pertain to the evaluation of patients at risk for COVID-19 are in a state of rapid change based on information released by regulatory bodies including the CDC and federal and state organizations. These policies and algorithms were followed during the patient's care in the ED.     Intermittent dizziness this morning. Similar to past episodes. Daughter noted BPs in 190s, prompting her visit to the ED. She is currently asymptomatic. Has not missed BP dose.  Nonfocal neuro exam. EKG reassuring w/o acute changes.  BPs now 140-150. Ambulated w/o  complication. Doubt central process.  Final Clinical Impression(s) / ED Diagnoses Final diagnoses:  Other secondary hypertension  Lightheadedness   The patient appears reasonably screened and/or stabilized for discharge and I doubt any other medical condition or other [facility] requiring further screening, evaluation, or treatment in the ED at this time prior to discharge. Safe for discharge with strict return precautions.  Disposition: Discharge  Condition: Good  I have discussed the results, Dx and Tx plan with the patient/family who expressed understanding and agree(s) with the plan. Discharge instructions discussed at length. The patient/family was given strict return precautions who verbalized understanding of the instructions. No further questions at time of discharge.    ED Discharge Orders     None        Follow Up: [name], [place] [address] [phone]  Call  if symptoms do not improve or  worsen     This chart was dictated using voice recognition software.  Despite best efforts to proofread,  errors can occur which can change the documentation meaning.    Fatima Blank, MD 02/25/21 [phone]

## 2021-02-25 NOTE — ED Notes (Signed)
Patient ambulated to bathroom without distress

## 2021-03-06 ENCOUNTER — Ambulatory Visit (INDEPENDENT_AMBULATORY_CARE_PROVIDER_SITE_OTHER): Payer: Medicare Other | Admitting: Neurology

## 2021-03-06 ENCOUNTER — Encounter: Payer: Self-pay | Admitting: Neurology

## 2021-03-06 VITALS — BP 142/71 | HR 81 | Ht 62.5 in | Wt 205.5 lb

## 2021-03-06 DIAGNOSIS — R269 Unspecified abnormalities of gait and mobility: Secondary | ICD-10-CM | POA: Diagnosis not present

## 2021-03-06 DIAGNOSIS — E1142 Type 2 diabetes mellitus with diabetic polyneuropathy: Secondary | ICD-10-CM | POA: Diagnosis not present

## 2021-03-06 NOTE — Progress Notes (Signed)
Chief Complaint  Patient presents with   Follow-up    Room 14 w/ dgt, Roderic Ovens. Gait has improved somewhat. Occasionally has a bad day. She uses a can to assist with ambulation. Denies any falls. Pt was helpful. She ordered a TENS unit off [place] that provides comfort.      ASSESSMENT AND PLAN  Mariah Mason is a 70 y.o. female   Diabetic peripheral neuropathy Gait abnormality  Long history of diabetes, length dependent sensory changes, evidence of diabetic peripheral neuropathy, A1c was 7.9, no other other treatable etiology for peripheral neuropathy found  EMG nerve conduction study on August 22, 2020 showed evidence of  moderate axonal sensory motor polyneuropathy  MRI of lumbar spine showed moderate stenosis at T11-12, with moderate bilateral foraminal stenosis, multilevel mild degenerative changes,  Her gait normality are multifactorial, this include aging, deconditioning, bilateral valgus knee, flatfeet, peripheral neuropathy,  Currently she has no evidence of lower thoracic myelopathy, in specific, no evidence of bowel and bladder incontinence, denies significant low back pain, continue moderate exercise,  Return to clinic for worsening symptoms, if needed, may consider repeat MRI of thoracic spine,  DIAGNOSTIC DATA (LABS, IMAGING, TESTING) - I reviewed patient records, labs, notes, testing and imaging myself where available.  Laboratory evaluation: Elevated creatinine 1.3, GFR 42, A1c 7.2, October 2020 was 7.6,  Laboratory evaluation in April 2022: Normal C-reactive protein, Y65, RPR, folic acid, CPK, TSH, ANA, CBC, CPK, A1c is elevated 7.9, ESR was mildly elevated 51, CMP showed elevated creatinine 1.7, glucose 176,  HISTORICAL  Mariah Mason, is a 70 year old female, seen in request by her podiatrist Dr. [name], and her primary care physician Dr. Margarita Rana, Mariah Mason, for evaluation of bilateral feet paresthesia, gait abnormality, initial evaluation was on August 02, 2020  I reviewed and summarized the referring note. PMHX. DM for 20 years Gout  HTN  Patient is a poor historian, suffered diabetes for many years, had a gradual onset of bilateral feet numbness tingling, needle prick, gradually getting worse since 2021, paresthesia become more persistent, reporting uncomfortable sensation, also began to involve bilateral fingertips, she denies significant pain,  She also noticed gradual onset worsening gait abnormality, she had a chronic bilateral knee pain, valgus knee, flatfeet, always walk was awkward posture, has much worsened since 2021,   Update August 22, 2020: She return for electrodiagnostic study today, which showed evidence of length dependent moderate axonal sensorimotor polyneuropathy, in addition, there is also evidence of chronic bilateral lumbosacral radiculopathy  She denies significant pain, but continue have gait abnormality, will improve with physical therapy, she denies bowel bladder incontinence  Laboratory evaluation for treatable cause of peripheral neuropathy showed elevated A1c 7.9, rest of the laboratory evaluation showed no significant abnormalities.  Update March 06, 2021: She is accompanied by her daughter at today's visit, went through physical therapy, made significant improvement, ambulate with a cane, she denies significant low back pain, denies bowel or bladder incontinence, occasionally bilateral feet paresthesia  Have personally reviewed MRI of lumbar in May 2022: Multilevel degenerative changes, T11-12, disc bulging, facet and ligamentum flavum hypertrophy with moderate spinal stenosis, bilateral foraminal narrowing, no cord signal abnormality.  Laboratory evaluation August 2022 showed negative HCV, lipid panel, LDL 138, CMP creatinine 1.67, glucose 194, A1c 7.4   REVIEW OF SYSTEMS: Full 14 system review of systems performed and notable only for as above All other review of systems were negative.  PHYSICAL EXAM    Vitals:   03/06/21 1328  BP: Marland Kitchen)  142/71  Pulse: 81  Weight: 205 lb 8 oz (93.2 kg)  Height: 5' 2.5" (1.588 m)   Not recorded     Body mass index is 36.99 kg/m.  PHYSICAL EXAMNIATION:  Gen: NAD, conversant, well nourised, well groomed         NEUROLOGICAL EXAM:  MENTAL STATUS: Speech/cognition: Awake, alert, oriented to history taking and casual conversation  CRANIAL NERVES: CN II: Visual fields are full to confrontation. Pupils are round equal and briskly reactive to light. CN III, IV, VI: extraocular movement are normal. No ptosis. CN V: Facial sensation is intact to light touch CN VII: Face is symmetric with normal eye closure  CN VIII: Hearing is normal to causal conversation. CN IX, X: Phonation is normal. CN XI: Head turning and shoulder shrug are intact  MOTOR: Mild bilateral toe extension, flexion weakness  REFLEXES: Reflexes are 1 and symmetric at the biceps, triceps, 2/2 knees, and absent at ankles. Plantar responses are flexor.  SENSORY: Bilateral lower extremity pitting edema, length dependent decreased vibratory sensation, pinprick to above ankle level  COORDINATION: There is no trunk or limb dysmetria noted.  GAIT/STANCE: She needs push-up to get up from seated position, unsteady, bilateral valgus knee, flatfeet, could not stand up on tiptoe, heel  ALLERGIES: Allergies  Allergen Reactions   Erythromycin Other (See Comments)    Does not remember reaction.    HOME MEDICATIONS: Current Outpatient Medications  Medication Sig Dispense Refill   allopurinol (ZYLOPRIM) 100 MG tablet TAKE 1 TABLET(100 MG) BY MOUTH DAILY 90 tablet 1   amLODipine (NORVASC) 10 MG tablet TAKE 1 TABLET(10 MG) BY MOUTH DAILY 90 tablet 1   atorvastatin (LIPITOR) 20 MG tablet Take 1 tablet (20 mg total) by mouth daily. 90 tablet 1   blood glucose meter kit and supplies KIT Dispense based on patient and insurance preference. Use up to four times daily as directed. (FOR ICD-9  250.00, 250.01). 1 each 0   carvedilol (COREG) 25 MG tablet TAKE 1 TABLET(25 MG) BY MOUTH TWICE DAILY WITH A MEAL 180 tablet 1   cholecalciferol (VITAMIN D3) 25 MCG (1000 UT) tablet Take 1,000 Units by mouth daily.     colchicine 0.6 MG tablet Take 2 tablets (1.84m) by mouth at onset for gout, may repeat 1 tablet 0.684min 2 hours is symptoms persist 30 tablet 1   cycloSPORINE (RESTASIS) 0.05 % ophthalmic emulsion Place 1 drop into both eyes 2 (two) times daily.      Elastic Bandages & Supports (MEDICAL COMPRESSION SOCKS) MISC Compression socks for DM peripheral neuropathy and bilateral leg swelling 2 each 3   Misc. Devices [place] chair. Dx- Diabetes mellitus 1 each 0   Multiple Vitamins-Minerals (CENTRUM SILVER ADULT 50+ PO) Take 1 tablet by mouth daily.     NONFORMULARY OR COMPOUNDED ITEM Peripheral Neuropathy Cream: Bupivacaine 1%, Doxepin 3%, Gabapentin 6%, Pentoxifylline 3%, Topiramate 1% [facility] each 3   Omega-3 Fatty Acids (FISH OIL) 1200 MG CAPS Take by mouth.     sitaGLIPtin (JANUVIA) 50 MG tablet TAKE 1 TABLET(50 MG) BY MOUTH DAILY 30 tablet 0   No current facility-administered medications for this visit.    PAST MEDICAL HISTORY: Past Medical History:  Diagnosis Date   Diabetes mellitus without complication ([place]   Gout    Hyperlipemia    Hypertension    Neuropathy     PAST SURGICAL HISTORY: Past Surgical History:  Procedure Laterality Date   CYST EXCISION N/A 08/30/2020  Procedure: EXCISION 1X2 cm LEFT NECK SEBACEOUS CYST EXCISION 2X2 CM RIGHT BACK SEBACEOUS CYST;  Surgeon: Rolm Bookbinder, MD;  Location: [facility];  Service: General;  Laterality: N/A;   THYROID SURGERY      FAMILY HISTORY: Family History  Problem Relation Age of Onset   Heart attack Mother    Kidney failure Father     SOCIAL HISTORY: Social History   Socioeconomic History   Marital status: Single    Spouse name: Not on file   Number of  children: 6   Years of education: 9th grade   Highest education level: Not on file  Occupational History   Occupation: Retired  Tobacco Use   Smoking status: Never   Smokeless tobacco: Never  Vaping Use   Vaping Use: Never used  Substance and Sexual Activity   Alcohol use: Never   Drug use: Never   Sexual activity: Not on file  Other Topics Concern   Not on file  Social History Narrative   Lives with family.   Right-handed.   No daily use of caffeine.   Social Determinants of Health   Financial Resource Strain: Not on file  Food Insecurity: Not on file  Transportation Needs: Not on file  Physical Activity: Not on file  Stress: Not on file  Social Connections: Not on file  Intimate Partner Violence: Not on file      Marcial Pacas, M.D. Ph.D.  [facility] [address], [place], [place] [address] Ph: [phone] Fax: [phone]  CC:  Charlott Rakes, MD [place],  [place] [address]  [name], [place]

## 2021-03-09 ENCOUNTER — Other Ambulatory Visit: Payer: Self-pay | Admitting: Family Medicine

## 2021-03-09 DIAGNOSIS — E1149 Type 2 diabetes mellitus with other diabetic neurological complication: Secondary | ICD-10-CM

## 2021-03-09 DIAGNOSIS — Z794 Long term (current) use of insulin: Secondary | ICD-10-CM

## 2021-03-09 NOTE — Telephone Encounter (Signed)
Requested medication (s) are due for refill today: Yes  Requested medication (s) are on the active medication list: Yes  Last refill:  02/02/21  Future visit scheduled: No  Notes to clinic:  Is pt. Still seen at the practice?    Requested Prescriptions  Pending Prescriptions Disp Refills   JANUVIA 50 MG tablet [Pharmacy Med Name: JANUVIA 50MG  TABLETS] 30 tablet 0    Sig: TAKE 1 TABLET(50 MG) BY MOUTH DAILY     Endocrinology:  Diabetes - DPP-4 Inhibitors Failed - 03/09/2021 10:06 AM      Failed - Cr in normal range and within 360 days    Creatinine, Ser  Date Value Ref Range Status  12/09/2020 1.67 (H) 0.57 - 1.00 mg/dL Final          Passed - HBA1C is between 0 and 7.9 and within 180 days    HbA1c, POC (controlled diabetic range)  Date Value Ref Range Status  11/23/2020 7.4 (A) 0.0 - 7.0 % Final          Passed - Valid encounter within last 6 months    Recent Outpatient Visits           3 months ago Type 2 diabetes mellitus with other neurologic complication, with long-term current use of insulin (HCC)   [facility] [place], [place], MD   10 months ago Neck mass   [facility] [place], [place], MD   1 year ago Type 2 diabetes mellitus with other neurologic complication, with long-term current use of insulin (HCC)   [facility] [place], [place], MD   1 year ago Type 2 diabetes mellitus with other specified complication, with long-term current use of insulin (HCC)   [facility] [place], [place], MD   1 year ago Type 2 diabetes mellitus with other specified complication, with long-term current use of insulin [facility])   [facility], MD       Future Appointments             In 2 weeks Hoy Register, [place], FNP Allyne Gee at [employer], [facility]

## 2021-03-11 ENCOUNTER — Other Ambulatory Visit: Payer: Self-pay | Admitting: Family Medicine

## 2021-03-11 ENCOUNTER — Other Ambulatory Visit: Payer: Self-pay | Admitting: Physician Assistant

## 2021-03-11 DIAGNOSIS — E1159 Type 2 diabetes mellitus with other circulatory complications: Secondary | ICD-10-CM

## 2021-03-11 DIAGNOSIS — I152 Hypertension secondary to endocrine disorders: Secondary | ICD-10-CM

## 2021-03-11 DIAGNOSIS — I10 Essential (primary) hypertension: Secondary | ICD-10-CM

## 2021-03-11 NOTE — Telephone Encounter (Signed)
Requested Prescriptions  Refused Prescriptions Disp Refills  . hydrALAZINE (APRESOLINE) 25 MG tablet [Pharmacy Med Name: HYDRALAZINE  25MG  TABLETS(ORANGE)] 180 tablet 1    Sig: TAKE 1 TABLET(25 MG) BY MOUTH TWICE DAILY     Cardiovascular:  Vasodilators Failed - 03/11/2021  7:11 AM      Failed - Last BP in normal range    BP Readings from Last 1 Encounters:  03/06/21 (!) 142/71         Passed - HCT in normal range and within 360 days    HCT  Date Value Ref Range Status  08/16/2020 37.0 36.0 - 46.0 % Final   Hematocrit  Date Value Ref Range Status  08/02/2020 36.3 34.0 - 46.6 % Final         Passed - HGB in normal range and within 360 days    Hemoglobin  Date Value Ref Range Status  08/16/2020 12.2 12.0 - 15.0 g/dL Final  08/18/2020 69/79/4801 11.1 - 15.9 g/dL Final         Passed - RBC in normal range and within 360 days    RBC  Date Value Ref Range Status  08/16/2020 4.44 3.87 - 5.11 MIL/uL Final         Passed - WBC in normal range and within 360 days    WBC  Date Value Ref Range Status  08/16/2020 7.8 4.0 - 10.5 K/uL Final         Passed - PLT in normal range and within 360 days    Platelets  Date Value Ref Range Status  08/16/2020 240 150 - 400 K/uL Final  08/02/2020 279 150 - 450 x10E3/uL Final         Passed - Valid encounter within last 12 months    Recent Outpatient Visits          3 months ago Type 2 diabetes mellitus with other neurologic complication, with long-term current use of insulin (HCC)   [facility] [place], [place], MD   10 months ago Neck mass   [facility] [place], [place], MD   1 year ago Type 2 diabetes mellitus with other neurologic complication, with long-term current use of insulin (HCC)   [place] [place], [place], MD   1 year ago Type 2 diabetes mellitus with other specified complication, with long-term current use of insulin (HCC)   [facility] [place], [place], MD   1 year ago Type 2 diabetes mellitus with other specified complication, with long-term current use of insulin [facility])   [place] [facility], MD      Future Appointments            In 2 weeks Hoy Register, [place], FNP Allyne Gee at [employer], [facility]

## 2021-03-27 ENCOUNTER — Other Ambulatory Visit: Payer: Self-pay

## 2021-03-28 ENCOUNTER — Ambulatory Visit (INDEPENDENT_AMBULATORY_CARE_PROVIDER_SITE_OTHER): Payer: Medicare Other | Admitting: Family

## 2021-03-28 ENCOUNTER — Encounter: Payer: Self-pay | Admitting: Family

## 2021-03-28 VITALS — BP 150/60 | HR 87 | Temp 98.0°F | Ht 62.5 in | Wt 206.6 lb

## 2021-03-28 DIAGNOSIS — I152 Hypertension secondary to endocrine disorders: Secondary | ICD-10-CM

## 2021-03-28 DIAGNOSIS — I10 Essential (primary) hypertension: Secondary | ICD-10-CM | POA: Diagnosis not present

## 2021-03-28 DIAGNOSIS — E1149 Type 2 diabetes mellitus with other diabetic neurological complication: Secondary | ICD-10-CM | POA: Diagnosis not present

## 2021-03-28 DIAGNOSIS — M1A00X0 Idiopathic chronic gout, unspecified site, without tophus (tophi): Secondary | ICD-10-CM | POA: Diagnosis not present

## 2021-03-28 DIAGNOSIS — Z23 Encounter for immunization: Secondary | ICD-10-CM

## 2021-03-28 DIAGNOSIS — M79605 Pain in left leg: Secondary | ICD-10-CM

## 2021-03-28 DIAGNOSIS — E1169 Type 2 diabetes mellitus with other specified complication: Secondary | ICD-10-CM

## 2021-03-28 DIAGNOSIS — E538 Deficiency of other specified B group vitamins: Secondary | ICD-10-CM | POA: Diagnosis not present

## 2021-03-28 DIAGNOSIS — Z794 Long term (current) use of insulin: Secondary | ICD-10-CM | POA: Diagnosis not present

## 2021-03-28 DIAGNOSIS — M79604 Pain in right leg: Secondary | ICD-10-CM | POA: Diagnosis not present

## 2021-03-28 DIAGNOSIS — E785 Hyperlipidemia, unspecified: Secondary | ICD-10-CM

## 2021-03-28 DIAGNOSIS — E1159 Type 2 diabetes mellitus with other circulatory complications: Secondary | ICD-10-CM | POA: Diagnosis not present

## 2021-03-28 MED ORDER — AMLODIPINE BESYLATE 10 MG PO TABS: ORAL_TABLET | ORAL | 3 refills | Status: DC

## 2021-03-28 MED ORDER — SITAGLIPTIN PHOSPHATE 50 MG PO TABS: ORAL_TABLET | ORAL | 3 refills | Status: DC

## 2021-03-28 MED ORDER — ALLOPURINOL 100 MG PO TABS: ORAL_TABLET | ORAL | 3 refills | Status: DC

## 2021-03-28 MED ORDER — CARVEDILOL 25 MG PO TABS: ORAL_TABLET | ORAL | 3 refills | Status: DC

## 2021-03-28 MED ORDER — ATORVASTATIN CALCIUM 20 MG PO TABS
20.0000 mg | ORAL_TABLET | Freq: Every day | ORAL | 3 refills | Status: DC
Start: 2021-03-28 — End: 2022-06-04

## 2021-03-28 MED ORDER — BLOOD GLUCOSE MONITOR KIT: PACK | 0 refills | Status: DC

## 2021-03-28 NOTE — Progress Notes (Signed)
Mariah Mason is a 70 y.o. female with the following history as recorded in EpicCare:  Patient Active Problem List   Diagnosis Date Noted   Bilateral lumbar radiculopathy 08/22/2020   DM type 2 with diabetic peripheral neuropathy ([place]) 08/02/2020   Gait abnormality 08/02/2020   Gout    Hypertension    Type 2 diabetes mellitus ([place]) 02/18/2019   AKI (acute kidney injury) ([place]) 02/18/2019   Hyperkalemia 77/93/9030   Metabolic acidosis 01/20/3006   Hypoglycemia 02/17/2019   Long term current use of oral hypoglycemic drug 05/23/2017   Presbyopia of both eyes 05/23/2017   Vitreous floaters of both eyes 05/23/2017   Keratoconjunctivitis sicca of both eyes not specified as Sjogren's 10/16/2016   Nuclear sclerotic cataract of both eyes 10/16/2016    Current Outpatient Medications  Medication Sig Dispense Refill   cholecalciferol (VITAMIN D3) 25 MCG (1000 UT) tablet Take 1,000 Units by mouth daily.     colchicine 0.6 MG tablet Take 2 tablets (1.$RemoveBefo'2mg'YFazQOimfRc$ ) by mouth at onset for gout, may repeat 1 tablet 0.$RemoveBe'6mg'GDIBaFTIn$  in 2 hours is symptoms persist 30 tablet 1   cycloSPORINE (RESTASIS) 0.05 % ophthalmic emulsion Place 1 drop into both eyes 2 (two) times daily.      Elastic Bandages & Supports (MEDICAL COMPRESSION SOCKS) MISC Compression socks for DM peripheral neuropathy and bilateral leg swelling 2 each 3   Misc. Devices [place] Shower chair. Dx- Diabetes mellitus 1 each 0   Multiple Vitamins-Minerals (CENTRUM SILVER ADULT 50+ PO) Take 1 tablet by mouth daily.     NONFORMULARY OR COMPOUNDED ITEM Peripheral Neuropathy Cream: Bupivacaine 1%, Doxepin 3%, Gabapentin 6%, Pentoxifylline 3%, Topiramate 1% Order faxed to [place] 1 each 3   Omega-3 Fatty Acids (FISH OIL) 1200 MG CAPS Take by mouth.     allopurinol (ZYLOPRIM) 100 MG tablet TAKE 1 TABLET(100 MG) BY MOUTH DAILY 90 tablet 3   amLODipine (NORVASC) 10 MG tablet TAKE 1 TABLET(10 MG) BY MOUTH DAILY 90 tablet 3   atorvastatin (LIPITOR) 20 MG  tablet Take 1 tablet (20 mg total) by mouth daily. 90 tablet 3   blood glucose meter kit and supplies KIT Dispense based on patient and insurance preference. Use up to four times daily as directed. (FOR ICD-9 250.00, 250.01). 1 each 0   carvedilol (COREG) 25 MG tablet TAKE 1 TABLET(25 MG) BY MOUTH TWICE DAILY WITH A MEAL 180 tablet 3   sitaGLIPtin (JANUVIA) 50 MG tablet TAKE 1 TABLET(50 MG) BY MOUTH DAILY 90 tablet 3   No current facility-administered medications for this visit.    Allergies: Erythromycin  Past Medical History:  Diagnosis Date   Diabetes mellitus without complication ([place])    Gout    Hyperlipemia    Hypertension    Neuropathy     Past Surgical History:  Procedure Laterality Date   CYST EXCISION N/A 08/30/2020   Procedure: EXCISION 1X2 cm LEFT NECK SEBACEOUS CYST EXCISION 2X2 CM RIGHT BACK SEBACEOUS CYST;  Surgeon: Rolm Bookbinder, MD;  Location: [facility];  Service: General;  Laterality: N/A;   THYROID SURGERY      Family History  Problem Relation Age of Onset   Heart attack Mother    Kidney failure Father     Social History   Tobacco Use   Smoking status: Never   Smokeless tobacco: Never  Substance Use Topics   Alcohol use: Never    Subjective:  Presents today as a new patient; accompanied by her daughter;  History of Type  2 Diabetes, hypertension, hyperlipidemia; careful medication reconciliation is done;   Defers flu shot and Tdap and Shingrix;   Requesting new Rx for glucometer;   Chronic history of neuropathy- has seen neurology in the past month;     Objective:  Vitals:   03/28/21 1426  BP: (!) 150/60  Pulse: 87  Temp: 98 F (36.7 C)  TempSrc: Oral  SpO2: 98%  Weight: 206 lb 9.6 oz (93.7 kg)  Height: 5' 2.5" (1.588 m)    General: Well developed, well nourished, in no acute distress  Skin : Warm and dry.  Head: Normocephalic and atraumatic  Eyes: Sclera and conjunctiva clear; pupils round and reactive to light;  extraocular movements intact  Ears: External normal; canals clear; tympanic membranes normal  Oropharynx: Pink, supple. No suspicious lesions  Neck: Supple without thyromegaly, adenopathy  Lungs: Respirations unlabored; clear to auscultation bilaterally without wheeze, rales, rhonchi  CVS exam: normal rate and regular rhythm.  Abdomen: Soft; nontender; nondistended; normoactive bowel sounds; no masses or hepatosplenomegaly  Musculoskeletal: No deformities; no active joint inflammation  Extremities: No edema, cyanosis, clubbing  Vessels: Symmetric bilaterally  Neurologic: Alert and oriented; speech intact; face symmetrical; moves all extremities well; CNII-XII intact without focal deficit  Assessment:  1. Pain in both lower extremities   2. Idiopathic chronic gout without tophus, unspecified site   3. Primary hypertension   4. Hyperlipidemia associated with type 2 diabetes mellitus ([place])   5. Hypertension complicating diabetes ([place])   6. Type 2 diabetes mellitus with other neurologic complication, with long-term current use of insulin (HCC)   7. Low vitamin B12 level   8. Need for prophylactic vaccination against Streptococcus pneumoniae (pneumococcus)     Plan:  Known history of neuropathy; to consider trial of Gabapentin; Stable; refill updated; Stable; refills updated;  Stable; refill updated; 6.   Check Hgba1c today; 8.   Prevnar 20 given;   Patient defers flu or shingrix today; mammogram is up to date;   This visit occurred during the SARS-CoV-2 public health emergency.  Safety protocols were in place, including screening questions prior to the visit, additional usage of staff PPE, and extensive cleaning of exam room while observing appropriate contact time as indicated for disinfecting solutions.    No follow-ups on file.  Orders Placed This Encounter  Procedures   [facility] MAMMOGRAPHY    This external order was created through the Results Console.   Pneumococcal conjugate  vaccine 20-valent (Prevnar 20)   CBC with Differential/Platelet   Comp Met (CMET)   Lipid panel   Hemoglobin A1c   B12    Requested Prescriptions   Signed Prescriptions Disp Refills   allopurinol (ZYLOPRIM) 100 MG tablet 90 tablet 3    Sig: TAKE 1 TABLET(100 MG) BY MOUTH DAILY   amLODipine (NORVASC) 10 MG tablet 90 tablet 3    Sig: TAKE 1 TABLET(10 MG) BY MOUTH DAILY   atorvastatin (LIPITOR) 20 MG tablet 90 tablet 3    Sig: Take 1 tablet (20 mg total) by mouth daily.   carvedilol (COREG) 25 MG tablet 180 tablet 3    Sig: TAKE 1 TABLET(25 MG) BY MOUTH TWICE DAILY WITH A MEAL   sitaGLIPtin (JANUVIA) 50 MG tablet 90 tablet 3    Sig: TAKE 1 TABLET(50 MG) BY MOUTH DAILY   blood glucose meter kit and supplies KIT 1 each 0    Sig: Dispense based on patient and insurance preference. Use up to four times daily as directed. (FOR ICD-9 250.00,  250.01).     

## 2021-03-29 LAB — COMPREHENSIVE METABOLIC PANEL
ALT: 11 U/L (ref 0–35)
AST: 13 U/L (ref 0–37)
Albumin: 4.3 g/dL (ref 3.5–5.2)
Alkaline Phosphatase: 59 U/L (ref 39–117)
BUN: 42 mg/dL — ABNORMAL HIGH (ref 6–23)
CO2: 25 mEq/L (ref 19–32)
Calcium: 9.6 mg/dL (ref 8.4–10.5)
Chloride: 106 mEq/L (ref 96–112)
Creatinine, Ser: 1.67 mg/dL — ABNORMAL HIGH (ref 0.40–1.20)
GFR: 30.71 mL/min — ABNORMAL LOW (ref >60.00)
Glucose, Bld: 111 mg/dL — ABNORMAL HIGH (ref 70–99)
Potassium: 4.4 mEq/L (ref 3.5–5.1)
Sodium: 141 mEq/L (ref 135–145)
Total Bilirubin: 0.4 mg/dL (ref 0.2–1.2)
Total Protein: 7.3 g/dL (ref 6.0–8.3)

## 2021-03-29 LAB — CBC WITH DIFFERENTIAL/PLATELET
Basophils Absolute: 0.1 10*3/uL (ref 0.0–0.1)
Basophils Relative: 0.9 % (ref 0.0–3.0)
Eosinophils Absolute: 0.2 10*3/uL (ref 0.0–0.7)
Eosinophils Relative: 2.7 % (ref 0.0–5.0)
HCT: 36.5 % (ref 36.0–46.0)
Hemoglobin: 11.8 g/dL — ABNORMAL LOW (ref 12.0–15.0)
Lymphocytes Relative: 29.2 % (ref 12.0–46.0)
Lymphs Abs: 1.9 10*3/uL (ref 0.7–4.0)
MCHC: 32.3 g/dL (ref 30.0–36.0)
MCV: 83.4 fl (ref 78.0–100.0)
Monocytes Absolute: 0.4 10*3/uL (ref 0.1–1.0)
Monocytes Relative: 6 % (ref 3.0–12.0)
Neutro Abs: 4 10*3/uL (ref 1.4–7.7)
Neutrophils Relative %: 61.2 % (ref 43.0–77.0)
Platelets: 241 10*3/uL (ref 150.0–400.0)
RBC: 4.38 Mil/uL (ref 3.87–5.11)
RDW: 13 % (ref 11.5–15.5)
WBC: 6.5 10*3/uL (ref 4.0–10.5)

## 2021-03-29 LAB — LIPID PANEL
Cholesterol: 202 mg/dL — ABNORMAL HIGH (ref 0–200)
HDL: 35.3 mg/dL — ABNORMAL LOW (ref >39.00)
LDL Cholesterol: 132 mg/dL — ABNORMAL HIGH (ref 0–99)
NonHDL: 167.19
Total CHOL/HDL Ratio: 6
Triglycerides: 176 mg/dL — ABNORMAL HIGH (ref 0.0–149.0)
VLDL: 35.2 mg/dL (ref 0.0–40.0)

## 2021-03-29 LAB — HEMOGLOBIN A1C: Hgb A1c MFr Bld: 7.4 % — ABNORMAL HIGH (ref 4.6–6.5)

## 2021-03-29 LAB — VITAMIN B12: Vitamin B-12: 1069 pg/mL — ABNORMAL HIGH (ref 211–911)

## 2021-04-03 ENCOUNTER — Other Ambulatory Visit: Payer: Self-pay

## 2021-04-03 ENCOUNTER — Telehealth: Payer: Self-pay | Admitting: Family

## 2021-04-03 DIAGNOSIS — Z794 Long term (current) use of insulin: Secondary | ICD-10-CM

## 2021-04-03 DIAGNOSIS — E1149 Type 2 diabetes mellitus with other diabetic neurological complication: Secondary | ICD-10-CM

## 2021-04-03 NOTE — Telephone Encounter (Signed)
Medication: Glucose testing strips  Has the patient contacted their pharmacy? Yes.   (If no, request that the patient contact the pharmacy for the refill.) (If yes, when and what did the pharmacy advise?)  Preferred Pharmacy (with phone number or street name):  [id number] - [place], [place] - [address]  [address], [place] [address]  Phone:  [phone]  Fax:  [phone]   Agent: Please be advised that RX refills may take up to 3 business days. We ask that you follow-up with your pharmacy.

## 2021-04-03 NOTE — Telephone Encounter (Signed)
I have called in a verbal for the lancets and the accu chek guide test strips. 100 of each with 1 refill.

## 2021-04-03 NOTE — Telephone Encounter (Signed)
Called in a verbal for accu chek guide lancets with test strips. 100 x 1 refill of each.

## 2021-05-04 ENCOUNTER — Telehealth: Payer: Self-pay | Admitting: Family

## 2021-05-04 NOTE — Telephone Encounter (Signed)
Forms received, completed and faxed back to [facility] once again.

## 2021-05-04 NOTE — Telephone Encounter (Signed)
[facility] pharmacy called to confirm we received a faxed sent over to [place] for the patient's diabetics supplies. Form was found on [employer] under Laura's name. She was informed and they stated they would wait for a response, but to make a note that they cannot refills any of the patient's supply until the form is sent back. Please advice.

## 2021-05-05 ENCOUNTER — Telehealth: Payer: Self-pay | Admitting: Family

## 2021-05-05 NOTE — Telephone Encounter (Signed)
I have called pharmacy and did a verbal for the test strips and the lancets for once daily with 2 refills.   Pharmacy stated understanding.

## 2021-05-05 NOTE — Telephone Encounter (Signed)
Pharmacy states on diabetic detailed order, the testing frequency is listed as once daily. Whereas on medication frequency is listed as 4 times daily. Pharmacy will need clarification before this can be processed. Please advise.

## 2021-05-05 NOTE — Telephone Encounter (Signed)
I have called the pt and relayed that the pt is suppose to test once daily and I did do verbals to the pharmacy 90 days supply with 2 refills.

## 2021-05-19 ENCOUNTER — Ambulatory Visit (INDEPENDENT_AMBULATORY_CARE_PROVIDER_SITE_OTHER): Payer: Medicare Other | Admitting: Podiatry

## 2021-05-19 ENCOUNTER — Other Ambulatory Visit: Payer: Self-pay

## 2021-05-19 ENCOUNTER — Encounter: Payer: Self-pay | Admitting: Podiatry

## 2021-05-19 DIAGNOSIS — E1142 Type 2 diabetes mellitus with diabetic polyneuropathy: Secondary | ICD-10-CM | POA: Diagnosis not present

## 2021-05-19 DIAGNOSIS — M79609 Pain in unspecified limb: Secondary | ICD-10-CM | POA: Diagnosis not present

## 2021-05-19 DIAGNOSIS — B351 Tinea unguium: Secondary | ICD-10-CM | POA: Diagnosis not present

## 2021-05-26 NOTE — Progress Notes (Signed)
Subjective: Mariah Mason is a 71 y.o. female patient seen today for follow up of  at risk foot care with history of diabetic neuropathy and painful thick toenails that are difficult to trim. Pain interferes with ambulation. Aggravating factors include wearing enclosed shoe gear. Pain is relieved with periodic professional debridement..   New problems reported today: None.  Patient states their blood glucose was 120 mg/dl this morning.   PCP is Olive Bass, FNP. Last visit was: 03/28/2021.  Allergies  Allergen Reactions   Erythromycin Other (See Comments)    Does not remember reaction.    Objective: Physical Exam  General: Patient is a pleasant 71 y.o. African American female in NAD. AAO x 3.   Neurovascular Examination: CFT immediate b/l LE. Palpable DP/PT pulses b/l LE. Digital hair sparse b/l. Skin temperature gradient WNL b/l. No pain with calf compression b/l. Trace edema b/l LE. No cyanosis or clubbing noted b/l LE.  Pt has subjective symptoms of neuropathy. Protective sensation intact 5/5 intact bilaterally with 10g monofilament b/l.  Dermatological:  Pedal integument with normal turgor, texture and tone b/l LE. No open wounds b/l. No interdigital macerations b/l. Toenails 1-5 b/l elongated, thickened, discolored with subungual debris. +Tenderness with dorsal palpation of nailplates. No hyperkeratotic or porokeratotic lesions present.  Musculoskeletal:  Muscle strength 5/5 to all lower extremity muscle groups bilaterally. Hammertoe deformity noted 2-5 b/l. Utilizes cane for ambulation assistance.  Assessment: 1. Pain due to onychomycosis of nail   2. Diabetic peripheral neuropathy associated with type 2 diabetes mellitus (HCC)    Plan: Patient was evaluated and treated and all questions answered. Consent given for treatment as described below: -No new findings. No new orders. -Mycotic toenails 1-5 bilaterally were debrided in length and girth with sterile nail  nippers and dremel without incident. -Patient/POA to call should there be question/concern in the interim.  Return in about 3 months (around 08/17/2021).  Freddie Breech, DPM

## 2021-06-09 ENCOUNTER — Other Ambulatory Visit: Payer: Self-pay | Admitting: Family Medicine

## 2021-08-21 ENCOUNTER — Ambulatory Visit: Payer: Medicare Other | Admitting: Podiatry

## 2021-09-01 ENCOUNTER — Encounter: Payer: Self-pay | Admitting: Podiatry

## 2021-09-01 ENCOUNTER — Ambulatory Visit (INDEPENDENT_AMBULATORY_CARE_PROVIDER_SITE_OTHER): Payer: Medicare Other | Admitting: Podiatry

## 2021-09-01 DIAGNOSIS — E1142 Type 2 diabetes mellitus with diabetic polyneuropathy: Secondary | ICD-10-CM

## 2021-09-01 DIAGNOSIS — M79609 Pain in unspecified limb: Secondary | ICD-10-CM

## 2021-09-01 DIAGNOSIS — B351 Tinea unguium: Secondary | ICD-10-CM

## 2021-09-11 ENCOUNTER — Other Ambulatory Visit: Payer: Self-pay | Admitting: Family

## 2021-09-11 DIAGNOSIS — M1A00X0 Idiopathic chronic gout, unspecified site, without tophus (tophi): Secondary | ICD-10-CM

## 2021-09-11 NOTE — Progress Notes (Signed)
?  Subjective:  ?Patient ID: Mariah Mason, female    DOB: 10-Jul-1950,  MRN: [id number] ? ?Mariah Mason presents to clinic today for at risk foot care with history of diabetic neuropathy and painful elongated mycotic toenails 1-5 bilaterally which are tender when wearing enclosed shoe gear. Pain is relieved with periodic professional debridement. ? ?Patient states blood glucose was 110 mg/dl today.   ? ?New problem(s): None.  ? ?PCP is Olive Bass, FNP , and last visit was March 28, 2021. ? ?Allergies  ?Allergen Reactions  ? Erythromycin Other (See Comments)  ?  Does not remember reaction.  ? ? ?Review of Systems: Negative except as noted in the HPI. ? ?Objective: No changes noted in today's physical examination. ? ?Constitutional Mariah Mason is a pleasant 71 y.o. African American female, in NAD. AAO x 3.   ?Vascular Capillary refill time to digits immediate b/l. Palpable DP pulse(s) b/l lower extremities Palpable PT pulse(s) b/l lower extremities Pedal hair sparse. Lower extremity skin temperature gradient within normal limits. No pain with calf compression b/l. Trace edema noted b/l lower extremities. No cyanosis or clubbing noted.  ?Neurologic Normal speech. Oriented to person, place, and time. Pt has subjective symptoms of neuropathy. Protective sensation intact 5/5 intact bilaterally with 10g monofilament b/l. Proprioception intact bilaterally.  ?Dermatologic Skin warm and supple b/l lower extremities. No open wounds b/l LE. Toenails 1-5 b/l elongated, discolored, dystrophic, thickened, crumbly with subungual debris and tenderness to dorsal palpation.  ?Orthopedic: Normal muscle strength 5/5 to all lower extremity muscle groups bilaterally. Hammertoe(s) noted to the b/l lower extremities. Utilizes cane for ambulation assistance.  ? ?Radiographs: None ? ?  Latest Ref Rng & Units 03/28/2021  ?  3:07 PM 11/23/2020  ?  3:55 PM  ?Hemoglobin A1C  ?Hemoglobin-A1c 4.6 - 6.5 % 7.4   7.4     ? ?Assessment/Plan: ?1. Pain due to onychomycosis of nail   ?2. Diabetic peripheral neuropathy associated with type 2 diabetes mellitus (HCC)   ?  ?-Patient was evaluated and treated. All patient's and/or POA's questions/concerns answered on today's visit. ?-Recommended thigh high stockings for edema control. ?-Toenails 1-5 b/l were debrided in length and girth with sterile nail nippers and dremel without iatrogenic bleeding.  ?-Patient/POA to call should there be question/concern in the interim.  ? ?Return in about 3 months (around 12/02/2021). ? ?Freddie Breech, DPM  ?

## 2021-09-26 ENCOUNTER — Ambulatory Visit (INDEPENDENT_AMBULATORY_CARE_PROVIDER_SITE_OTHER): Payer: Medicare Other | Admitting: Family

## 2021-09-26 VITALS — BP 160/72 | HR 74 | Temp 98.7°F | Resp 18 | Ht 62.5 in | Wt 208.4 lb

## 2021-09-26 DIAGNOSIS — I10 Essential (primary) hypertension: Secondary | ICD-10-CM

## 2021-09-26 DIAGNOSIS — Z794 Long term (current) use of insulin: Secondary | ICD-10-CM

## 2021-09-26 DIAGNOSIS — E785 Hyperlipidemia, unspecified: Secondary | ICD-10-CM

## 2021-09-26 DIAGNOSIS — E1149 Type 2 diabetes mellitus with other diabetic neurological complication: Secondary | ICD-10-CM

## 2021-09-26 DIAGNOSIS — E1169 Type 2 diabetes mellitus with other specified complication: Secondary | ICD-10-CM

## 2021-09-26 DIAGNOSIS — M1A00X0 Idiopathic chronic gout, unspecified site, without tophus (tophi): Secondary | ICD-10-CM | POA: Diagnosis not present

## 2021-09-26 LAB — COMPREHENSIVE METABOLIC PANEL
ALT: 10 U/L (ref 0–35)
AST: 12 U/L (ref 0–37)
Albumin: 4.4 g/dL (ref 3.5–5.2)
Alkaline Phosphatase: 61 U/L (ref 39–117)
BUN: 43 mg/dL — ABNORMAL HIGH (ref 6–23)
CO2: 25 mEq/L (ref 19–32)
Calcium: 9.9 mg/dL (ref 8.4–10.5)
Chloride: 106 mEq/L (ref 96–112)
Creatinine, Ser: 1.77 mg/dL — ABNORMAL HIGH (ref 0.40–1.20)
GFR: 28.54 mL/min — ABNORMAL LOW (ref >60.00)
Glucose, Bld: 174 mg/dL — ABNORMAL HIGH (ref 70–99)
Potassium: 4.7 mEq/L (ref 3.5–5.1)
Sodium: 140 mEq/L (ref 135–145)
Total Bilirubin: 0.5 mg/dL (ref 0.2–1.2)
Total Protein: 7.5 g/dL (ref 6.0–8.3)

## 2021-09-26 LAB — CBC WITH DIFFERENTIAL/PLATELET
Basophils Absolute: 0 10*3/uL (ref 0.0–0.1)
Basophils Relative: 0.8 % (ref 0.0–3.0)
Eosinophils Absolute: 0.1 10*3/uL (ref 0.0–0.7)
Eosinophils Relative: 2.8 % (ref 0.0–5.0)
HCT: 34.9 % — ABNORMAL LOW (ref 36.0–46.0)
Hemoglobin: 11.6 g/dL — ABNORMAL LOW (ref 12.0–15.0)
Lymphocytes Relative: 30.9 % (ref 12.0–46.0)
Lymphs Abs: 1.6 10*3/uL (ref 0.7–4.0)
MCHC: 33.2 g/dL (ref 30.0–36.0)
MCV: 82.6 fl (ref 78.0–100.0)
Monocytes Absolute: 0.3 10*3/uL (ref 0.1–1.0)
Monocytes Relative: 6.5 % (ref 3.0–12.0)
Neutro Abs: 3 10*3/uL (ref 1.4–7.7)
Neutrophils Relative %: 59 % (ref 43.0–77.0)
Platelets: 227 10*3/uL (ref 150.0–400.0)
RBC: 4.23 Mil/uL (ref 3.87–5.11)
RDW: 13.5 % (ref 11.5–15.5)
WBC: 5.1 10*3/uL (ref 4.0–10.5)

## 2021-09-26 LAB — HEMOGLOBIN A1C: Hgb A1c MFr Bld: 7.3 % — ABNORMAL HIGH (ref 4.6–6.5)

## 2021-09-26 MED ORDER — COLCHICINE 0.6 MG PO TABS: ORAL_TABLET | ORAL | 1 refills | Status: AC

## 2021-09-26 MED ORDER — ALLOPURINOL 100 MG PO TABS: ORAL_TABLET | ORAL | 3 refills | Status: DC

## 2021-09-26 MED ORDER — VALSARTAN 80 MG PO TABS: 80.0000 mg | ORAL_TABLET | Freq: Every day | ORAL | 1 refills | Status: DC

## 2021-09-26 NOTE — Progress Notes (Signed)
Mariah Mason is a 71 y.o. female with the following history as recorded in EpicCare:  Patient Active Problem List   Diagnosis Date Noted   Bilateral lumbar radiculopathy 08/22/2020   DM type 2 with diabetic peripheral neuropathy ([place]) 08/02/2020   Gait abnormality 08/02/2020   Gout    Hypertension    Type 2 diabetes mellitus ([place]) 02/18/2019   AKI (acute kidney injury) ([place]) 02/18/2019   Hyperkalemia 67/59/1638   Metabolic acidosis 46/65/9935   Hypoglycemia 02/17/2019   Long term current use of oral hypoglycemic drug 05/23/2017   Presbyopia of both eyes 05/23/2017   Vitreous floaters of both eyes 05/23/2017   Keratoconjunctivitis sicca of both eyes not specified as Sjogren's 10/16/2016   Nuclear sclerotic cataract of both eyes 10/16/2016    Current Outpatient Medications  Medication Sig Dispense Refill   ACCU-CHEK GUIDE test strip TEST BLOOD SUGAR ONCE DAILY     Accu-Chek Softclix Lancets lancets 4 (four) times daily.     amLODipine (NORVASC) 10 MG tablet TAKE 1 TABLET(10 MG) BY MOUTH DAILY 90 tablet 3   atorvastatin (LIPITOR) 20 MG tablet Take 1 tablet (20 mg total) by mouth daily. 90 tablet 3   blood glucose meter kit and supplies KIT Dispense based on patient and insurance preference. Use up to four times daily as directed. (FOR ICD-9 250.00, 250.01). 1 each 0   carvedilol (COREG) 25 MG tablet TAKE 1 TABLET(25 MG) BY MOUTH TWICE DAILY WITH A MEAL 180 tablet 3   cholecalciferol (VITAMIN D3) 25 MCG (1000 UT) tablet Take 1,000 Units by mouth daily.     cycloSPORINE (RESTASIS) 0.05 % ophthalmic emulsion Place 1 drop into both eyes 2 (two) times daily.      Elastic Bandages & Supports (MEDICAL COMPRESSION SOCKS) MISC Compression socks for DM peripheral neuropathy and bilateral leg swelling 2 each 3   Misc. Devices [place] Shower chair. Dx- Diabetes mellitus 1 each 0   Multiple Vitamins-Minerals (CENTRUM SILVER ADULT 50+ PO) Take 1 tablet by mouth daily.     NONFORMULARY OR COMPOUNDED  ITEM Peripheral Neuropathy Cream: Bupivacaine 1%, Doxepin 3%, Gabapentin 6%, Pentoxifylline 3%, Topiramate 1% Order faxed to [place] 1 each 3   sitaGLIPtin (JANUVIA) 50 MG tablet TAKE 1 TABLET(50 MG) BY MOUTH DAILY 90 tablet 3   valsartan (DIOVAN) 80 MG tablet Take 1 tablet (80 mg total) by mouth at bedtime. 90 tablet 1   allopurinol (ZYLOPRIM) 100 MG tablet TAKE 1 TABLET(100 MG) BY MOUTH DAILY 90 tablet 3   colchicine 0.6 MG tablet Take 2 tablets (1.11m) by mouth at onset for gout, may repeat 1 tablet 0.693min 2 hours is symptoms persist 30 tablet 1   No current facility-administered medications for this visit.    Allergies: Erythromycin  Past Medical History:  Diagnosis Date   Diabetes mellitus without complication ([place]   Gout    Hyperlipemia    Hypertension    Neuropathy     Past Surgical History:  Procedure Laterality Date   CYST EXCISION N/A 08/30/2020   Procedure: EXCISION 1X2 cm LEFT NECK SEBACEOUS CYST EXCISION 2X2 CM RIGHT BACK SEBACEOUS CYST;  Surgeon: WaRolm BookbinderMD;  [facility] Service: General;  Laterality: N/A;   THYROID SURGERY      Family History  Problem Relation Age of Onset   Heart attack Mother    Kidney failure Father     Social History   Tobacco Use   Smoking status: Never   Smokeless  tobacco: Never  Substance Use Topics   Alcohol use: Never    Subjective:  Here for 6 month follow up on Type 2 Diabetes; last seen in office in November 2022- has only been seen in this office once;  Known history of neuropathy- under care of neurology;  Does check blood pressure and notes that is fluctuates; takes Amlodipine in the am and Coreg bid;  Only taking Atorvastatin for lipids- is not on Zetia;  Is only taking Januvia for her diabetes management;      Objective:  Vitals:   09/26/21 0952  BP: (!) 160/72  Pulse: 74  Resp: 18  Temp: 98.7 F (37.1 C)  TempSrc: Oral  SpO2: 98%  Weight: 208 lb 6.4 oz (94.5  kg)  Height: 5' 2.5" (1.588 m)    General: Well developed, well nourished, in no acute distress  Skin : Warm and dry.  Head: Normocephalic and atraumatic  Eyes: Sclera and conjunctiva clear; pupils round and reactive to light; extraocular movements intact  Ears: External normal; canals clear; tympanic membranes normal  Oropharynx: Pink, supple. No suspicious lesions  Neck: Supple without thyromegaly, adenopathy  Lungs: Respirations unlabored; clear to auscultation bilaterally without wheeze, rales, rhonchi  CVS exam: normal rate and regular rhythm.  Neurologic: Alert and oriented; speech intact; face symmetrical; uses cane for stability;  Assessment:  1. Type 2 diabetes mellitus with other neurologic complication, with long-term current use of insulin ([place])   2. Idiopathic chronic gout without tophus, unspecified site   3. Primary hypertension   4. Hyperlipidemia associated with type 2 diabetes mellitus ([place])     Plan:  Update labs today; Stable; refills updated; Uncontrolled- add Valsartan 80 mg qhs; continue other medications as prescribed; follow up in 6 weeks; Stable- only taking Atorvastatin;   Return in about 6 weeks (around 11/07/2021).  Orders Placed This Encounter  Procedures   CBC with Differential/Platelet   Comp Met (CMET)   Hemoglobin A1c    Requested Prescriptions   Signed Prescriptions Disp Refills   valsartan (DIOVAN) 80 MG tablet 90 tablet 1    Sig: Take 1 tablet (80 mg total) by mouth at bedtime.   allopurinol (ZYLOPRIM) 100 MG tablet 90 tablet 3    Sig: TAKE 1 TABLET(100 MG) BY MOUTH DAILY   colchicine 0.6 MG tablet 30 tablet 1    Sig: Take 2 tablets (1.60m) by mouth at onset for gout, may repeat 1 tablet 0.619min 2 hours is symptoms persist

## 2021-09-28 ENCOUNTER — Telehealth: Payer: Self-pay

## 2021-09-28 NOTE — Telephone Encounter (Signed)
Spoke w/ Pt -discussed labs again wit her- she does need a referral to a nephrologist placed please.

## 2021-09-29 ENCOUNTER — Other Ambulatory Visit: Payer: Self-pay | Admitting: Family

## 2021-09-29 ENCOUNTER — Other Ambulatory Visit: Payer: Self-pay

## 2021-09-29 DIAGNOSIS — R7989 Other specified abnormal findings of blood chemistry: Secondary | ICD-10-CM

## 2021-09-29 MED ORDER — HYDRALAZINE HCL 10 MG PO TABS: 25.0000 mg | ORAL_TABLET | Freq: Two times a day (BID) | ORAL | 1 refills | Status: DC

## 2021-10-02 ENCOUNTER — Other Ambulatory Visit: Payer: Self-pay

## 2021-10-02 ENCOUNTER — Encounter (HOSPITAL_BASED_OUTPATIENT_CLINIC_OR_DEPARTMENT_OTHER): Payer: Self-pay | Admitting: Pediatrics

## 2021-10-02 ENCOUNTER — Emergency Department (HOSPITAL_BASED_OUTPATIENT_CLINIC_OR_DEPARTMENT_OTHER)
Admission: EM | Admit: 2021-10-02 | Discharge: 2021-10-02 | Disposition: A | Discharge status: Home / Self Care | Payer: Medicare Other | Attending: Emergency Medicine | Admitting: Emergency Medicine

## 2021-10-02 ENCOUNTER — Telehealth: Payer: Self-pay | Admitting: Family

## 2021-10-02 ENCOUNTER — Emergency Department (HOSPITAL_BASED_OUTPATIENT_CLINIC_OR_DEPARTMENT_OTHER): Payer: Medicare Other

## 2021-10-02 DIAGNOSIS — N189 Chronic kidney disease, unspecified: Secondary | ICD-10-CM | POA: Diagnosis not present

## 2021-10-02 DIAGNOSIS — I129 Hypertensive chronic kidney disease with stage 1 through stage 4 chronic kidney disease, or unspecified chronic kidney disease: Secondary | ICD-10-CM | POA: Insufficient documentation

## 2021-10-02 DIAGNOSIS — E1140 Type 2 diabetes mellitus with diabetic neuropathy, unspecified: Secondary | ICD-10-CM | POA: Insufficient documentation

## 2021-10-02 DIAGNOSIS — R519 Headache, unspecified: Secondary | ICD-10-CM

## 2021-10-02 DIAGNOSIS — I10 Essential (primary) hypertension: Secondary | ICD-10-CM

## 2021-10-02 LAB — BASIC METABOLIC PANEL
Anion gap: 10 (ref 5–15)
BUN: 46 mg/dL — ABNORMAL HIGH (ref 8–23)
CO2: 21 mmol/L — ABNORMAL LOW (ref 22–32)
Calcium: 9.3 mg/dL (ref 8.9–10.3)
Chloride: 105 mmol/L (ref 98–111)
Creatinine, Ser: 1.66 mg/dL — ABNORMAL HIGH (ref 0.44–1.00)
GFR, Estimated: 33 mL/min — ABNORMAL LOW (ref >60)
Glucose, Bld: 132 mg/dL — ABNORMAL HIGH (ref 70–99)
Potassium: 4.3 mmol/L (ref 3.5–5.1)
Sodium: 136 mmol/L (ref 135–145)

## 2021-10-02 LAB — CBC
HCT: 36.2 % (ref 36.0–46.0)
Hemoglobin: 12.1 g/dL (ref 12.0–15.0)
MCH: 27.2 pg (ref 26.0–34.0)
MCHC: 33.4 g/dL (ref 30.0–36.0)
MCV: 81.3 fL (ref 80.0–100.0)
Platelets: 234 10*3/uL (ref 150–400)
RBC: 4.45 MIL/uL (ref 3.87–5.11)
RDW: 12.8 % (ref 11.5–15.5)
WBC: 6.5 10*3/uL (ref 4.0–10.5)
nRBC: 0 % (ref 0.0–0.2)

## 2021-10-02 LAB — TROPONIN I (HIGH SENSITIVITY): Troponin I (High Sensitivity): 14 ng/L (ref <18)

## 2021-10-02 LAB — HEPATIC FUNCTION PANEL
ALT: 14 U/L (ref 0–44)
AST: 14 U/L — ABNORMAL LOW (ref 15–41)
Albumin: 4.3 g/dL (ref 3.5–5.0)
Alkaline Phosphatase: 59 U/L (ref 38–126)
Bilirubin, Direct: <0.1 mg/dL (ref 0.0–0.2)
Total Bilirubin: 0.6 mg/dL (ref 0.3–1.2)
Total Protein: 8.4 g/dL — ABNORMAL HIGH (ref 6.5–8.1)

## 2021-10-02 LAB — CBG MONITORING, ED
Glucose-Capillary: 138 mg/dL — ABNORMAL HIGH (ref 70–99)
Glucose-Capillary: 133 mg/dL — ABNORMAL HIGH (ref 70–99)

## 2021-10-02 MED ORDER — ACETAMINOPHEN 500 MG PO TABS
1000.0000 mg | ORAL_TABLET | Freq: Once | ORAL | Status: AC
  Administered 2021-10-02: 1000 mg via ORAL
  Filled 2021-10-02: qty 2

## 2021-10-02 NOTE — ED Notes (Signed)
ED Provider at bedside. 

## 2021-10-02 NOTE — ED Triage Notes (Signed)
C/O pressure like headache since yesterday morning along w/ dizziness.

## 2021-10-02 NOTE — ED Provider Notes (Signed)
Emergency Department Provider Note   I have reviewed the triage vital signs and the nursing notes.   HISTORY  Chief Complaint Dizziness and Headache   HPI Mariah Mason is a 71 y.o. female presents to the ED with pressure-like HA and lightheadedness starting yesterday. No sudden onset/max intensity HA. No fever. No numbness/weakness. No CP or palpitations. Has had issues with BP and following with PCP. Daughter notes that she was recently prescribed two new BP meds but was told to stop one due to kidney issues and recalls having lightheadedness with Hydralazine in the past and so has not started that one either.    Past Medical History:  Diagnosis Date   Diabetes mellitus without complication ([place])    Gout    Hyperlipemia    Hypertension    Neuropathy     Review of Systems  Constitutional: No fever/chills Eyes: No visual changes. ENT: No sore throat. Cardiovascular: Denies chest pain. Respiratory: Denies shortness of breath. Gastrointestinal: No abdominal pain.  No nausea, no vomiting.  No diarrhea.  No constipation. Genitourinary: Negative for dysuria. Musculoskeletal: Negative for back pain. Skin: Negative for rash. Neurological: Negative for focal weakness or numbness. Positive HA.   ____________________________________________   PHYSICAL EXAM:  VITAL SIGNS: ED Triage Vitals  Enc Vitals Group     BP 10/02/21 1645 (!) 176/77     Pulse Rate 10/02/21 1645 87     Resp 10/02/21 1645 17     Temp 10/02/21 1645 98.6 F (37 C)     Temp Source 10/02/21 1645 Oral     SpO2 10/02/21 1645 98 %   Constitutional: Alert and oriented. Well appearing and in no acute distress. Eyes: Conjunctivae are normal. PERRL. EOMI. Head: Atraumatic. Nose: No congestion/rhinnorhea. Mouth/Throat: Mucous membranes are moist.   Neck: No stridor.  Cardiovascular: Normal rate, regular rhythm. Good peripheral circulation. Grossly normal heart sounds.   Respiratory: Normal respiratory  effort.  No retractions. Lungs CTAB. Gastrointestinal: Soft and nontender. No distention.  Musculoskeletal: No lower extremity tenderness nor edema. No gross deformities of extremities. Neurologic:  Normal speech and language. No gross focal neurologic deficits are appreciated.  Skin:  Skin is warm, dry and intact. No rash noted.   ____________________________________________   LABS (all labs ordered are listed, but only abnormal results are displayed)  Labs Reviewed  BASIC METABOLIC PANEL - Abnormal; Notable for the following components:      Result Value   CO2 21 (*)    Glucose, Bld 132 (*)    BUN 46 (*)    Creatinine, Ser 1.66 (*)    GFR, Estimated 33 (*)    All other components within normal limits  HEPATIC FUNCTION PANEL - Abnormal; Notable for the following components:   Total Protein 8.4 (*)    AST 14 (*)    All other components within normal limits  CBG MONITORING, ED - Abnormal; Notable for the following components:   Glucose-Capillary 138 (*)    All other components within normal limits  CBG MONITORING, ED - Abnormal; Notable for the following components:   Glucose-Capillary 133 (*)    All other components within normal limits  CBC  TROPONIN I (HIGH SENSITIVITY)  TROPONIN I (HIGH SENSITIVITY)   ____________________________________________  EKG   EKG Interpretation  Date/Time:  Monday October 02 2021 16:50:30 EDT Ventricular Rate:  89 PR Interval:  166 QRS Duration: 80 QT Interval:  352 QTC Calculation: 428 R Axis:   41 Text Interpretation: Sinus rhythm with Premature  supraventricular complexes Otherwise normal ECG When compared with ECG of 25-Feb-2021 00:55, PREVIOUS ECG IS PRESENT Confirmed by Nanda Quinton [phone]) on 10/02/2021 5:04:43 PM        ____________________________________________  RADIOLOGY  CT Head Wo Contrast  Result Date: 10/02/2021 CLINICAL DATA:  Headache, new or worsening. Dizziness. Sudden onset beginning 3 days ago. EXAM: CT HEAD  WITHOUT CONTRAST TECHNIQUE: Contiguous axial images were obtained from the base of the skull through the vertex without intravenous contrast. RADIATION DOSE REDUCTION: This exam was performed according to the departmental dose-optimization program which includes automated exposure control, adjustment of the mA and/or kV according to patient size and/or use of iterative reconstruction technique. COMPARISON:  08/16/2020 FINDINGS: Brain: Chronic small-vessel ischemic changes affect the cerebral hemispheric white matter. No sign of acute infarction, mass lesion, hemorrhage, hydrocephalus or extra-axial collection. Vascular: There is atherosclerotic calcification of the major vessels at the base of the brain. Skull: Negative Sinuses/Orbits: Clear/normal Other: None IMPRESSION: No acute finding. No cause of headache identified. Chronic small-vessel ischemic changes of the cerebral hemispheric white matter. Electronically Signed   By: Nelson Chimes M.D.   On: 10/02/2021 18:10    ____________________________________________   PROCEDURES  Procedure(s) performed:   Procedures  None ____________________________________________   INITIAL IMPRESSION / ASSESSMENT AND PLAN / ED COURSE  Pertinent labs & imaging results that were available during my care of the patient were reviewed by me and considered in my medical decision making (see chart for details).   This patient is Presenting for Evaluation of HA, which does require a range of treatment options, and is a complaint that involves a high risk of morbidity and mortality.  The Differential Diagnoses includes but is not exclusive to subarachnoid hemorrhage, meningitis, encephalitis, previous head trauma, cavernous venous thrombosis, muscle tension headache, glaucoma, temporal arteritis, migraine or migraine equivalent, etc.   I did obtain Additional Historical Information from daughter at bedside.   Clinical Laboratory Tests Ordered, included troponin  WNL. Normal LFTs. CKD noted near her baseline lab values.   Radiologic Tests Ordered, included CT head. I independently interpreted the images and agree with radiology interpretation.   Cardiac Monitor Tracing which shows NSR. No ectopy.    Social Determinants of Health Risk patient is a non-smoker.   Medical Decision Making: Summary:  Presents to the ED with HA and weakness. Labs are reassuring and near baseline. CT head without acute changes. No focal neuro deficits or other findings to suggest acute HTN emergency.   Reevaluation with update and discussion with patient and daughter. Plan for close PCP follow up for BP mgmt and med adjustment.   Considered admission but patient with mild HTN here and no evidence of end-organ damage. Has good PCP follow up plan. Ambulatory here. No neuro deficits.   Disposition: discharge  ____________________________________________  FINAL CLINICAL IMPRESSION(S) / ED DIAGNOSES  Final diagnoses:  Bad headache  Primary hypertension  Chronic kidney disease, unspecified CKD stage   Note:  This document was prepared using Dragon voice recognition software and may include unintentional dictation errors.  Nanda Quinton, MD, [facility] Emergency Medicine    Samil Mecham, [facility], MD 10/03/21 1318

## 2021-10-02 NOTE — Telephone Encounter (Signed)
Rhianna, [place] // [phone]  Patient's daughter called stating that the hydralazine is making her mom dizzy and it has done that before in the past. She would like to know if another medication can be given. Please advise.

## 2021-10-02 NOTE — ED Provider Triage Note (Signed)
Emergency Medicine Provider Triage Evaluation Note  Mariah Mason , a 71 y.o. female  was evaluated in triage.  Pt complains of HA and dizziness. No sudden onset, max intensity HA. No fever. Symptoms ongoing for the last 3 days.   Review of Systems  Positive: HA and dizziness  Negative: CP and abdominal pain   Physical Exam  BP (!) 176/77 (BP Location: Left Arm)   Pulse 87   Temp 98.6 F (37 C) (Oral)   Resp 17   SpO2 98%  Gen:   Awake, no distress, ambulatory with a caine.  Resp:  Normal effort  MSK:   Moves extremities without difficulty  Other:  No focal neuro deficits.   Medical Decision Making  Medically screening exam initiated at 5:18 PM.  Appropriate orders placed.  Mariah Mason was informed that the remainder of the evaluation will be completed by another provider, this initial triage assessment does not replace that evaluation, and the importance of remaining in the ED until their evaluation is complete.   EKG Interpretation  Date/Time:  Monday October 02 2021 16:50:30 EDT Ventricular Rate:  89 PR Interval:  166 QRS Duration: 80 QT Interval:  352 QTC Calculation: 428 R Axis:   41 Text Interpretation: Sinus rhythm with Premature supraventricular complexes Otherwise normal ECG When compared with ECG of 25-Feb-2021 00:55, PREVIOUS ECG IS PRESENT Confirmed by Alona Bene [phone]) on 10/02/2021 5:04:43 PM          Cristella Stiver, Arlyss Repress, MD 10/02/21 1723

## 2021-10-02 NOTE — Telephone Encounter (Signed)
Please advise 

## 2021-10-02 NOTE — Discharge Instructions (Signed)
You have been seen in the Emergency Department (ED) for a headache.  Please use Tylenol as needed for symptoms, but only as written on the box.  As we have discussed, please follow up with your primary care doctor as soon as possible regarding today's Emergency Department (ED) visit and your headache symptoms.    Call your doctor or return to the ED if you have a worsening headache, sudden and severe headache, confusion, slurred speech, facial droop, weakness or numbness in any arm or leg, extreme fatigue, vision problems, or other symptoms that concern you.  

## 2021-10-03 ENCOUNTER — Telehealth: Payer: Self-pay | Admitting: Family

## 2021-10-03 MED ORDER — CLONIDINE HCL 0.1 MG PO TABS: 0.1000 mg | ORAL_TABLET | Freq: Every evening | ORAL | 0 refills | Status: DC

## 2021-10-03 NOTE — Telephone Encounter (Signed)
Pts Daughter is aware is aware of results.

## 2021-10-03 NOTE — Telephone Encounter (Signed)
Mariah Mason (daughter DPR OK) called stating that the pt doesn't feel comfortable taking the clonidine because she is concerned with it being used as an antidepressant/ADHD treatment as well as a BP med.  Mariah Mason also stated that during the pt's visit to the ED on 6.5.23 that the attending Dr was planning on putting her on a different medication for BP (started with an a).  Mariah Mason also wanted to see if the pt could take hydrochlorothiazide for BP.   Please Advise

## 2021-10-03 NOTE — Telephone Encounter (Signed)
I have discontinued the Hydralazine and will list it as an allergy so it is marked that she cannot take this medication. I am going to try her on Clonidine. She will just take this in the evening initially and we will consider increasing to twice a day as we see how she tolerates/ responds. She does stay on the Amlodipine and Carvedilol as previously prescribed.

## 2021-10-03 NOTE — Telephone Encounter (Signed)
Patient's daughter called back to update Vernona Rieger regarding her mom's bp. She states that the pts bp got to 190/80 and they took her to the ER. She would like a new medication asap. Please advise.

## 2021-10-04 NOTE — Telephone Encounter (Signed)
Called daughter and made appointment for 10/05/2021 at 4:15pm. Asked that they bring patient's blood pressure monitor with them.

## 2021-10-05 ENCOUNTER — Ambulatory Visit: Payer: Medicare Other | Admitting: Pharmacist

## 2021-10-05 VITALS — BP 136/62

## 2021-10-05 DIAGNOSIS — R7989 Other specified abnormal findings of blood chemistry: Secondary | ICD-10-CM

## 2021-10-05 DIAGNOSIS — I10 Essential (primary) hypertension: Secondary | ICD-10-CM

## 2021-10-09 ENCOUNTER — Telehealth: Payer: Self-pay | Admitting: Family

## 2021-10-09 DIAGNOSIS — I10 Essential (primary) hypertension: Secondary | ICD-10-CM

## 2021-10-09 DIAGNOSIS — R7989 Other specified abnormal findings of blood chemistry: Secondary | ICD-10-CM

## 2021-10-09 NOTE — Chronic Care Management (AMB) (Addendum)
Care Management   Pharmacy Note  10/09/2021 Name: Mariah Mason MRN: [id number] DOB: 1951/03/27  Subjective: Mariah Mason is a 71 y.o. year old female who is a primary care patient of Mariah Mason, [place]. The Care Management team was consulted for assistance with hypertension management and care coordination needs  Engaged with patient face to face for initial visit in response to provider referral for pharmacy case management and/or care coordination services.   The patient was given information about Care Management services today including:  Care Management services includes personalized support from designated clinical staff supervised by the patient's primary care provider, including individualized plan of care and coordination with other care providers. 24/7 contact phone numbers for assistance for urgent and routine care needs. The patient may stop case management services at any time by phone call to the office staff.  Patient agreed to services and consent obtained.  Patient appearing on report for [employer] [place] [employer]/ Was referred by primary care provider due to uncontrolled hypertension and CKD.   BP Readings from Last 3 Encounters:  10/05/21 136/62  10/02/21 (!) 177/93  09/26/21 (!) 160/72    Current antihypertensives: amlodipine 49m daily each morning and carvedilol 273mtwice a day Patient was prescribed clonidine 0.38m79mach evening but has not started yet due to family's concern with side effects. Also was prescribed hydralazine but patient tried in past and was not able to tolerate - caused dizziness.    Patient has an automated upper arm home BP machine that she brought in today. Home blood pressure monitor blood pressure was 177/105. Office blood pressure was 150/68, 145/70 and 136/62 (blood pressure gradually decreased each time checked in office)   Patient denies hypotensive signs and symptoms  Patient reports hypertensive symptoms including: headache     Assessment:  Review of patient status, including review of consultants reports, laboratory and other test data, was performed as part of comprehensive evaluation and provision of chronic care management services.   SDOH (Social Determinants of Health) assessments and interventions performed:    Objective:  Lab Results  Component Value Date   CREATININE 1.66 (H) 10/02/2021   CREATININE 1.77 (H) 09/26/2021   CREATININE 1.67 (H) 03/28/2021    Lab Results  Component Value Date   HGBA1C 7.3 (H) 09/26/2021       Component Value Date/Time   CHOL 202 (H) 03/28/2021 1507   CHOL 206 (H) 12/09/2020 0908   TRIG 176.0 (H) 03/28/2021 1507   HDL 35.30 (L) 03/28/2021 1507   HDL 33 (L) 12/09/2020 0908   CHOLHDL 6 03/28/2021 1507   VLDL 35.2 03/28/2021 1507   LDLCALC 132 (H) 03/28/2021 1507   LDLCALC 138 (H) 12/09/2020 0908    Other: (TSH, CBC, Vit D, etc.)  Clinical ASCVD: Yes  The 10-year ASCVD risk score (Arnett DK, et al., 2019) is: 28.9%   Values used to calculate the score:     Age: 71 42ars     Sex: Female     Is Non-Hispanic African American: Yes     Diabetic: Yes     Tobacco smoker: No     Systolic Blood Pressure: 136465Hg     Is BP treated: Yes     HDL Cholesterol: 35.3 mg/dL     Total Cholesterol: 202 mg/dL    BP Readings from Last 3 Encounters:  10/05/21 136/62  10/02/21 (!) 177/93  09/26/21 (!) 160/72    Care Plan  Allergies  Allergen Reactions   Hydralazine Other (See  Comments)    Not able to tolerate per patient; caused her to feel dizzy   Erythromycin Other (See Comments)    Does not remember reaction.    Medications Reviewed Today     Reviewed by Mariah Mason, RPH-CPP (Pharmacist) on 10/05/21 at 29  Med List Status: <None>   Medication Order Taking? Sig Documenting Provider Last Dose Status Informant  ACCU-CHEK GUIDE test strip [id number] Yes TEST BLOOD SUGAR ONCE DAILY [provider] Taking Active   Accu-Chek Softclix Lancets  lancets [id number] Yes 4 (four) times daily. [provider] Taking Active   allopurinol (ZYLOPRIM) 100 MG tablet [id number] Yes TAKE 1 TABLET(100 MG) BY MOUTH DAILY Mariah Salvage, FNP Taking Active   amLODipine (NORVASC) 10 MG tablet [id number] Yes TAKE 1 TABLET(10 MG) BY MOUTH DAILY Mariah Salvage, FNP Taking Active   atorvastatin (LIPITOR) 20 MG tablet [id number] Yes Take 1 tablet (20 mg total) by mouth daily. Mariah Salvage, FNP Taking Active   blood glucose meter kit and supplies KIT [id number] Yes Dispense based on patient and insurance preference. Use up to four times daily as directed. (FOR ICD-9 250.00, 250.01). Mariah Salvage, FNP Taking Active   carvedilol (COREG) 25 MG tablet [id number] Yes TAKE 1 TABLET(25 MG) BY MOUTH TWICE DAILY WITH A MEAL Mariah Salvage, FNP Taking Active   cholecalciferol (VITAMIN D3) 25 MCG (1000 UT) tablet [id number] Yes Take 1,000 Units by mouth daily. [provider] Taking Active Child  colchicine 0.6 MG tablet [id number] Yes Take 2 tablets (1.76m) by mouth at onset for gout, may repeat 1 tablet 0.662min 2 hours is symptoms persist MuMarrian SalvageFNP Taking Active   cycloSPORINE (RESTASIS) 0.05 % ophthalmic emulsion [id number] Place 1 drop into both eyes 2 (two) times daily.  [provider] Taking Active Child  Elastic Bandages & Supports (MEDICAL COMPRESSION SOCKS) MISC [id number] Compression socks for DM peripheral neuropathy and bilateral leg swelling YaMarcial PacasMD Taking Active            Med Note ([facility] PsMIElson Clanov 7, 2022  1:26 PM)    Misc. Devices [id number] Shower chair. Dx- Diabetes mellitus NeCharlott RakesMD Taking Active   Multiple Vitamins-Minerals (CENTRUM SILVER ADULT 50+ PO) [id number] Take 1 tablet by mouth daily. [provider] Taking Active Child  NONFORMULARY OR COMPOUNDED ITEM [id number] Peripheral Neuropathy Cream: Bupivacaine  1%, Doxepin 3%, Gabapentin 6%, Pentoxifylline 3%, Topiramate 1% Order faxed to [facility] PoliceDPConnecticutaking Active   sitaGLIPtin (JANUVIA) 50 MG tablet [id number] TAKE 1 TABLET(50 MG) BY MOUTH DAILY MuMarrian SalvageFNP Taking Active             Patient Active Problem List   Diagnosis Date Noted   Bilateral lumbar radiculopathy 08/22/2020   DM type 2 with diabetic peripheral neuropathy ([facility] [place]04/08/2020   Gait abnormality 08/02/2020   Gout    Hypertension    Type 2 diabetes mellitus ([facility]10/21/2020   AKI (acute kidney injury) ([facility]10/21/2020   Hyperkalemia 1069/45/0388 Metabolic acidosis 1082/80/0349 Hypoglycemia 02/17/2019   Long term current use of oral hypoglycemic drug 05/23/2017   Presbyopia of both eyes 05/23/2017   Vitreous floaters of both eyes 05/23/2017   Keratoconjunctivitis sicca of both eyes not specified as Sjogren's 10/16/2016   Nuclear sclerotic cataract of both eyes 10/16/2016      Assessment/Plan: - Currently blood pressure variable -  Reviewed goal blood pressure <130/80 due to CKD - Counseled on long term microvascular and macrovascular complications of uncontrolled hypertension - Reviewed appropriate home BP monitoring technique (avoid caffeine, smoking, and exercise for 30 minutes before checking, rest for at least 5 minutes before taking BP, sit with feet flat on the floor and back against a hard surface, uncross legs, and rest arm on flat surface) Patient to get new blood pressure cuff as current cuff was not accurate compared to in office blood pressure cuff today. - Reviewed to check blood pressure once daily, document, and provide at next provider visit - Patient has referral to nephrology - Possible therapies in future if blood pressure remains above goal - furosemide or torsemide at low dose with close monitoring of BMP / renal function, Farxiga to replace Januvia for blood pressure, edema and diabetes.    Medication Assistance:  None required.  Patient affirms current coverage meets needs.  Follow Up:  Patient agrees to Care Plan and Follow-up.  Plan: Telephone follow up appointment with care management team member scheduled for:  1 week to check home blood pressure readings.   Mariah Mason, PharmD Clinical Pharmacist [facility] [place] [facility]  I have personally reviewed this encounter including the documentation in this note and have collaborated with the care management provider regarding care management and care coordination activities to include development and update of the comprehensive care plan. I am certifying that I agree with the content of this note and encounter as supervising physician.

## 2021-10-09 NOTE — Telephone Encounter (Signed)
Spoke to daughter. Patient's blood pressure was 161/80 over the weekend with new blood pressure cuff. Today was 148/77.  They have not received call for appointment with nephrologist yet.  Will check with PCP about possibly trying low dose loop diuretic like torsemide 8m or furosemide 257mdaily since patient has had some edema. Will need to check BMP in 1 week to assess renal function.  Would avoid hydrochlorothiazide as effectiveness decreases when eGFR is < 30 mL/min (patient's last eGFR was 33)

## 2021-10-09 NOTE — Telephone Encounter (Signed)
Pt's daughter stated her mom's BP spiked over the weekend but is currently back down. Pt would like to try the next rx.

## 2021-10-10 ENCOUNTER — Other Ambulatory Visit: Payer: Self-pay

## 2021-10-10 ENCOUNTER — Other Ambulatory Visit: Payer: Self-pay | Admitting: Family

## 2021-10-10 ENCOUNTER — Emergency Department (HOSPITAL_BASED_OUTPATIENT_CLINIC_OR_DEPARTMENT_OTHER)
Admission: EM | Admit: 2021-10-10 | Discharge: 2021-10-10 | Disposition: A | Discharge status: Home / Self Care | Payer: Medicare Other | Attending: Emergency Medicine | Admitting: Emergency Medicine

## 2021-10-10 ENCOUNTER — Telehealth: Payer: Self-pay | Admitting: Pharmacist

## 2021-10-10 DIAGNOSIS — R519 Headache, unspecified: Secondary | ICD-10-CM | POA: Diagnosis present

## 2021-10-10 DIAGNOSIS — E1122 Type 2 diabetes mellitus with diabetic chronic kidney disease: Secondary | ICD-10-CM | POA: Insufficient documentation

## 2021-10-10 DIAGNOSIS — N1832 Chronic kidney disease, stage 3b: Secondary | ICD-10-CM | POA: Diagnosis not present

## 2021-10-10 DIAGNOSIS — I129 Hypertensive chronic kidney disease with stage 1 through stage 4 chronic kidney disease, or unspecified chronic kidney disease: Secondary | ICD-10-CM | POA: Diagnosis not present

## 2021-10-10 DIAGNOSIS — I10 Essential (primary) hypertension: Secondary | ICD-10-CM

## 2021-10-10 DIAGNOSIS — Z79899 Other long term (current) drug therapy: Secondary | ICD-10-CM | POA: Diagnosis not present

## 2021-10-10 DIAGNOSIS — N183 Chronic kidney disease, stage 3 unspecified: Secondary | ICD-10-CM

## 2021-10-10 LAB — CBC WITH DIFFERENTIAL/PLATELET
Abs Immature Granulocytes: 0.01 10*3/uL (ref 0.00–0.07)
Basophils Absolute: 0 10*3/uL (ref 0.0–0.1)
Basophils Relative: 1 %
Eosinophils Absolute: 0.2 10*3/uL (ref 0.0–0.5)
Eosinophils Relative: 3 %
HCT: 35.8 % — ABNORMAL LOW (ref 36.0–46.0)
Hemoglobin: 12 g/dL (ref 12.0–15.0)
Immature Granulocytes: 0 %
Lymphocytes Relative: 28 %
Lymphs Abs: 1.8 10*3/uL (ref 0.7–4.0)
MCH: 27.6 pg (ref 26.0–34.0)
MCHC: 33.5 g/dL (ref 30.0–36.0)
MCV: 82.3 fL (ref 80.0–100.0)
Monocytes Absolute: 0.5 10*3/uL (ref 0.1–1.0)
Monocytes Relative: 7 %
Neutro Abs: 4.1 10*3/uL (ref 1.7–7.7)
Neutrophils Relative %: 61 %
Platelets: 237 10*3/uL (ref 150–400)
RBC: 4.35 MIL/uL (ref 3.87–5.11)
RDW: 12.8 % (ref 11.5–15.5)
WBC: 6.6 10*3/uL (ref 4.0–10.5)
nRBC: 0 % (ref 0.0–0.2)

## 2021-10-10 LAB — URINALYSIS, MICROSCOPIC (REFLEX)

## 2021-10-10 LAB — BASIC METABOLIC PANEL
Anion gap: 6 (ref 5–15)
BUN: 50 mg/dL — ABNORMAL HIGH (ref 8–23)
CO2: 24 mmol/L (ref 22–32)
Calcium: 9.4 mg/dL (ref 8.9–10.3)
Chloride: 107 mmol/L (ref 98–111)
Creatinine, Ser: 1.79 mg/dL — ABNORMAL HIGH (ref 0.44–1.00)
GFR, Estimated: 30 mL/min — ABNORMAL LOW (ref >60)
Glucose, Bld: 183 mg/dL — ABNORMAL HIGH (ref 70–99)
Potassium: 4.7 mmol/L (ref 3.5–5.1)
Sodium: 137 mmol/L (ref 135–145)

## 2021-10-10 LAB — URINALYSIS, ROUTINE W REFLEX MICROSCOPIC
Bilirubin Urine: NEGATIVE
Glucose, UA: NEGATIVE mg/dL
Ketones, ur: NEGATIVE mg/dL
Nitrite: NEGATIVE
Protein, ur: 100 mg/dL — AB
Specific Gravity, Urine: <=1.005 (ref 1.005–1.030)
pH: 5.5 (ref 5.0–8.0)

## 2021-10-10 MED ORDER — PROCHLORPERAZINE EDISYLATE 10 MG/2ML IJ SOLN
2.5000 mg | Freq: Once | INTRAMUSCULAR | Status: AC
Start: 2021-10-10 — End: 2021-10-10
  Administered 2021-10-10: 2.5 mg via INTRAVENOUS
  Filled 2021-10-10: qty 2

## 2021-10-10 MED ORDER — FUROSEMIDE 20 MG PO TABS: 20.0000 mg | ORAL_TABLET | Freq: Every day | ORAL | 3 refills | Status: DC

## 2021-10-10 NOTE — Addendum Note (Signed)
Addended by: Henrene Pastor B on: 10/10/2021 02:01 PM   Modules accepted: Orders

## 2021-10-10 NOTE — ED Provider Notes (Signed)
[facility] Provider Note   CSN: [id number] Arrival date & time: 10/10/21  0048     History  Chief Complaint  Patient presents with   Hypertension    Mariah Mason is a 71 y.o. female.  The history is provided by the patient and medical records.  Hypertension  Mariah Mason is a 71 y.o. female who presents to the Emergency Department complaining of headache.  She presents to the emergency department for waxing and waning pressure to the back of her head in the occipital region for the last 2 weeks.  It is described as a tightness that comes and goes.  This evening it started while she was watching TV.  No associated vision changes, chest pain, shortness of breath, fever, abdominal pain, nausea, vomiting, leg swelling.  She has been followed by her PCP for elevated blood pressures as well as CKD.  She is scheduled to start a new blood pressure medication tomorrow.  She just started checking her blood pressure at home on Friday and tries to check it 2-3 times daily.  It has been running 1 40-1 50 and at S923 systolic.  She did take 2 Tylenol prior to ED arrival for her headache.  She also has a history of diabetes.  Currently takes amlodipine, carvedilol.      Home Medications Prior to Admission medications   Medication Sig Start Date End Date Taking? Authorizing Provider  ACCU-CHEK GUIDE test strip TEST BLOOD SUGAR ONCE DAILY 05/05/21   [provider]  Accu-Chek Softclix Lancets lancets 4 (four) times daily. 04/27/21   [provider]  allopurinol (ZYLOPRIM) 100 MG tablet TAKE 1 TABLET(100 MG) BY MOUTH DAILY 09/26/21   [name], FNP  amLODipine (NORVASC) 10 MG tablet TAKE 1 TABLET(10 MG) BY MOUTH DAILY 03/28/21   [name], FNP  atorvastatin (LIPITOR) 20 MG tablet Take 1 tablet (20 mg total) by mouth daily. 03/28/21   [name], FNP  blood glucose meter kit and supplies KIT Dispense based on  patient and insurance preference. Use up to four times daily as directed. (FOR ICD-9 250.00, 250.01). 03/28/21   [name], FNP  carvedilol (COREG) 25 MG tablet TAKE 1 TABLET(25 MG) BY MOUTH TWICE DAILY WITH A MEAL 03/28/21   [name], FNP  cholecalciferol (VITAMIN D3) 25 MCG (1000 UT) tablet Take 1,000 Units by mouth daily.    [provider]  colchicine 0.6 MG tablet Take 2 tablets (1.10m) by mouth at onset for gout, may repeat 1 tablet 0.636min 2 hours is symptoms persist 09/26/21   MuMarrian SalvageFNP  cycloSPORINE (RESTASIS) 0.05 % ophthalmic emulsion Place 1 drop into both eyes 2 (two) times daily.  12/05/18   [provider]  Elastic Bandages & Supports (MEDICAL COMPRESSION SOCKS) MISC Compression socks for DM peripheral neuropathy and bilateral leg swelling 08/02/20   YaMarcial PacasMD  Misc. Devices MIBarryhower chair. Dx- Diabetes mellitus 08/12/19   NeCharlott RakesMD  Multiple Vitamins-Minerals (CENTRUM SILVER ADULT 50+ PO) Take 1 tablet by mouth daily.    [provider]  NONFORMULARY OR COMPOUNDED ITEM Peripheral Neuropathy Cream: Bupivacaine 1%, Doxepin 3%, Gabapentin 6%, Pentoxifylline 3%, Topiramate 1% [facility]   GaMarzetta BoardDPM  sitaGLIPtin (JANUVIA) 50 MG tablet TAKE 1 TABLET(50 MG) BY MOUTH DAILY 03/28/21   MuMarrian SalvageFNP      Allergies    Hydralazine and Erythromycin  Review of Systems   Review of Systems  All other systems reviewed and are negative.   Physical Exam Updated Vital Signs BP (!) 162/67   Pulse 70   Temp 98.1 F (36.7 C) (Oral)   Resp 16   Ht 5' 2.5" (1.588 m)   Wt 94.3 kg   SpO2 100%   BMI 37.44 kg/m  Physical Exam Vitals and nursing note reviewed.  Constitutional:      Appearance: She is well-developed.  HENT:     Head: Normocephalic and atraumatic.  Cardiovascular:     Rate and Rhythm: Normal rate and regular rhythm.     Heart  sounds: No murmur heard. Pulmonary:     Effort: Pulmonary effort is normal. No respiratory distress.     Breath sounds: Normal breath sounds.  Abdominal:     Palpations: Abdomen is soft.     Tenderness: There is no abdominal tenderness. There is no guarding or rebound.  Musculoskeletal:        General: No tenderness.     Comments: 2+ DP pulses bilaterally  Skin:    General: Skin is warm and dry.  Neurological:     Mental Status: She is alert and oriented to person, place, and time.     Comments: No asymmetry of facial movements.  Visual fields grossly intact.  5 out of 5 strength in all 4 extremities with sensation to light touch intact in all 4 extremities.  Psychiatric:        Behavior: Behavior normal.     ED Results / Procedures / Treatments   Labs (all labs ordered are listed, but only abnormal results are displayed) Labs Reviewed  BASIC METABOLIC PANEL - Abnormal; Notable for the following components:      Result Value   Glucose, Bld 183 (*)    BUN 50 (*)    Creatinine, Ser 1.79 (*)    GFR, Estimated 30 (*)    All other components within normal limits  CBC WITH DIFFERENTIAL/PLATELET - Abnormal; Notable for the following components:   HCT 35.8 (*)    All other components within normal limits  URINALYSIS, ROUTINE W REFLEX MICROSCOPIC - Abnormal; Notable for the following components:   Hgb urine dipstick TRACE (*)    Protein, ur 100 (*)    Leukocytes,Ua TRACE (*)    All other components within normal limits  URINALYSIS, MICROSCOPIC (REFLEX) - Abnormal; Notable for the following components:   Bacteria, UA FEW (*)    Non Squamous Epithelial PRESENT (*)    All other components within normal limits    EKG None  Radiology No results found.  Procedures Procedures    Medications Ordered in ED Medications  prochlorperazine (COMPAZINE) injection 2.5 mg (2.5 mg Intravenous Given 10/10/21 0202)    ED Course/ Medical Decision Making/ A&P                            Medical Decision Making Amount and/or Complexity of Data Reviewed Labs: ordered.  Risk Prescription drug management.   Patient with history of hypertension and diabetes here for evaluation of occipital headache as well as concern for her elevated blood pressure.  She is nontoxic-appearing on assessment with no focal neurologic deficits.  Recent ED visit reviewed from June 5-Labs and CT head were obtained at that time.  Her headache was treated with Compazine in the emergency department.  Labs with renal function near her baseline.  Do not feel  repeat head imaging is warranted at this time.  After treatment with medications in the department her headache is improved.  Blood pressure remains elevated, but on record review this appears to be near her baseline for multiple PCP visits.  Current clinical picture is not consistent with press, hypertensive urgency, meningitis, CVA.  Discussed with patient, daughter home care for headache as well as PCP follow-up and return precautions.        Final Clinical Impression(s) / ED Diagnoses Final diagnoses:  Bad headache  Primary hypertension  Stage 3 chronic kidney disease, unspecified whether stage 3a or 3b CKD ([place])    Rx / DC Orders ED Discharge Orders     None         Quintella Reichert, MD 10/10/21 0400

## 2021-10-10 NOTE — ED Triage Notes (Signed)
Pt reports elevated BP at home, she reports PCP is going to start her on new BP meds tomorrow.  Also c/o "head pressure in back of my neck." Denies Chest pain, denies shob.

## 2021-10-10 NOTE — ED Notes (Signed)
Pt discharged to home. Discharge instructions have been discussed with patient and/or family members. Pt verbally acknowledges understanding d/c instructions, and endorses comprehension to checkout at registration before leaving.  °

## 2021-10-10 NOTE — Telephone Encounter (Signed)
Opened in error - see phone note from 10/09/2021

## 2021-10-10 NOTE — Telephone Encounter (Signed)
Prescription for furosemide 20mg  daily was sent to pharmacy by PCP. Spoke with patient's daughter and notified of new medications. BMP / lab visit set for 1 week.  Also provided number for [facility] for referral. [phone].

## 2021-10-10 NOTE — Telephone Encounter (Signed)
Patient's daughter called this morning to report that Ms Rolls had gone to ED yesterday evening because her SBP was in the 170's and patient had headache.  Blood pressure in ED was 162/67 and pulse 70. Headache resolved with treatment in ED with compazine as well as acetaminophen taken by patient prior to presenting to ED.  Continue with plan to discuss potential therapies for hypertension (low dose furosemide or torsemide or reconsider adding clonidine - though last week family declined to start clonidine due to side effect concerns). Urinalysis did show proteinuria and BMP elevated BG so SGLT2 is also an option.  Referral to nephrology is pending.

## 2021-10-10 NOTE — ED Notes (Addendum)
Charted on wrong pt.

## 2021-10-10 NOTE — ED Notes (Signed)
ED Provider at bedside. 

## 2021-10-13 ENCOUNTER — Ambulatory Visit: Payer: Medicare Other | Admitting: Pharmacist

## 2021-10-13 DIAGNOSIS — I10 Essential (primary) hypertension: Secondary | ICD-10-CM

## 2021-10-13 NOTE — Chronic Care Management (AMB) (Addendum)
Care Management   Pharmacy Note  10/13/2021 Name: Mariah Mason MRN: [id number] DOB: 09-09-1950  Subjective: Mariah Mason is a 71 y.o. year old female who is a primary care patient of [name], [place]. The Care Management team was consulted for assistance with hypertension management and care coordination needs  Engaged with patient by telephone for follow up visit in response to provider referral for pharmacy case management and/or care coordination services.   The patient was given information about Care Management services today including:  Care Management services includes personalized support from designated clinical staff supervised by the patient's primary care provider, including individualized plan of care and coordination with other care providers. 24/7 contact phone numbers for assistance for urgent and routine care needs. The patient may stop case management services at any time by phone call to the office staff.  Patient agreed to services and consent obtained.  Patient appearing on report for [employer] [place] [employer]/ Was referred by primary care provider due to uncontrolled hypertension and CKD.   BP Readings from Last 3 Encounters:  10/10/21 (!) 162/67  10/05/21 136/62  10/02/21 (!) 177/93    Current antihypertensives: amlodipine 13m daily each morning and carvedilol 246mtwice a day; started furosemide 2031maily on 10/10/2021 Patient was prescribed clonidine 0.1mg54mch evening but did not start due to family's concern with side effects. Also was prescribed hydralazine but patient tried in past and was not able to tolerate - caused dizziness.   Patient has purchase a new automated upper arm home BP machine.  Recent home blood pressure readings since starting furosemide 20mg56m6/13: 148/76 6/14: 157/76 6/15: 150/77 in am and 148/78 in pm 6/16: has not checked yet.   Patient denies hypotensive signs and symptoms  Patient reports hypertensive symptoms  including: headache (earlier this week but not in the last 3 day)  Assessment:  Review of patient status, including review of consultants reports, laboratory and other test data, was performed as part of comprehensive evaluation and provision of chronic care management services.   SDOH (Social Determinants of Health) assessments and interventions performed:    Objective:  Lab Results  Component Value Date   CREATININE 1.79 (H) 10/10/2021   CREATININE 1.66 (H) 10/02/2021   CREATININE 1.77 (H) 09/26/2021    Lab Results  Component Value Date   HGBA1C 7.3 (H) 09/26/2021       Component Value Date/Time   CHOL 202 (H) 03/28/2021 1507   CHOL 206 (H) 12/09/2020 0908   TRIG 176.0 (H) 03/28/2021 1507   HDL 35.30 (L) 03/28/2021 1507   HDL 33 (L) 12/09/2020 0908   CHOLHDL 6 03/28/2021 1507   VLDL 35.2 03/28/2021 1507   LDLCALC 132 (H) 03/28/2021 1507   LDLCALC 138 (H) 12/09/2020 0908   Clinical ASCVD: Yes  The 10-year ASCVD risk score (Arnett DK, et al., 2019) is: 37.7%   Values used to calculate the score:     Age: 57 ye51s     Sex: Female     Is Non-Hispanic African American: Yes     Diabetic: Yes     Tobacco smoker: No     Systolic Blood Pressure: 162 m701     Is BP treated: Yes     HDL Cholesterol: 35.3 mg/dL     Total Cholesterol: 202 mg/dL    BP Readings from Last 3 Encounters:  10/10/21 (!) 162/67  10/05/21 136/62  10/02/21 (!) 177/93    Care Plan  Allergies  Allergen Reactions  Hydralazine Other (See Comments)    Not able to tolerate per patient; caused her to feel dizzy   Erythromycin Other (See Comments)    Does not remember reaction.    Medications Reviewed Today     Reviewed by Cherre Robins, RPH-CPP (Pharmacist) on 10/13/21 at [place] List Status: <None>   Medication Order Taking? Sig Documenting Provider Last Dose Status Informant  ACCU-CHEK GUIDE test strip [id number] Yes TEST BLOOD SUGAR ONCE DAILY [provider] Taking Active    Accu-Chek Softclix Lancets lancets [id number] Yes 4 (four) times daily. [provider] Taking Active   allopurinol (ZYLOPRIM) 100 MG tablet [id number] Yes TAKE 1 TABLET(100 MG) BY MOUTH DAILY [name], FNP Taking Active   amLODipine (NORVASC) 10 MG tablet [id number] Yes TAKE 1 TABLET(10 MG) BY MOUTH DAILY [name], FNP Taking Active   atorvastatin (LIPITOR) 20 MG tablet [id number] Yes Take 1 tablet (20 mg total) by mouth daily. [name], FNP Taking Active   blood glucose meter kit and supplies KIT [id number] Yes Dispense based on patient and insurance preference. Use up to four times daily as directed. (FOR ICD-9 250.00, 250.01). [name], FNP Taking Active   carvedilol (COREG) 25 MG tablet [id number] Yes TAKE 1 TABLET(25 MG) BY MOUTH TWICE DAILY WITH A MEAL [name], FNP Taking Active   cholecalciferol (VITAMIN D3) 25 MCG (1000 UT) tablet [id number] Yes Take 1,000 Units by mouth daily. [provider] Taking Active Child  colchicine 0.6 MG tablet [id number] Yes Take 2 tablets (1.13m) by mouth at onset for gout, may repeat 1 tablet 0.665min 2 hours is symptoms persist MuMarrian SalvageFNP Taking Active   cycloSPORINE (RESTASIS) 0.05 % ophthalmic emulsion [id number] Place 1 drop into both eyes 2 (two) times daily.  [provider] Taking Active Child  Elastic Bandages & Supports (MEDICAL COMPRESSION SOCKS) MISC [id number] Compression socks for DM peripheral neuropathy and bilateral leg swelling YaMarcial PacasMD Taking Active            Med Note (KLebanon [facility] Affairs Medical CenterMIElson Clanov 7, 2022  1:26 PM)    furosemide (LASIX) 20 MG tablet [id number] Take 1 tablet (20 mg total) by mouth daily. MuMarrian SalvageFNP Taking Active   Misc. Devices [id number] Shower chair. Dx- Diabetes mellitus NeCharlott RakesMD Taking Active   Multiple Vitamins-Minerals (CENTRUM SILVER ADULT 50+ PO) [id number]  Take 1 tablet by mouth daily. [provider] Taking Active Child  NONFORMULARY OR COMPOUNDED ITEM [id number] Peripheral Neuropathy Cream: Bupivacaine 1%, Doxepin 3%, Gabapentin 6%, Pentoxifylline 3%, Topiramate 1% Order faxed to [facility]Stephani PoliceDPConnecticutaking Active   sitaGLIPtin (JANUVIA) 50 MG tablet [id number] TAKE 1 TABLET(50 MG) BY MOUTH DAILY MuMarrian SalvageFNP Taking Active             Patient Active Problem List   Diagnosis Date Noted   Bilateral lumbar radiculopathy 08/22/2020   DM type 2 with diabetic peripheral neuropathy (HCGilbert04/08/2020   Gait abnormality 08/02/2020   Gout    Hypertension    Type 2 diabetes mellitus (HCWatertown Town10/21/2020   AKI (acute kidney injury) (HCMiami Heights10/21/2020   Hyperkalemia 1017/49/4496 Metabolic acidosis 1075/91/6384 Hypoglycemia 02/17/2019   Long term current use of oral hypoglycemic drug 05/23/2017   Presbyopia of both eyes 05/23/2017   Vitreous floaters of both eyes 05/23/2017   Keratoconjunctivitis sicca of both  eyes not specified as Sjogren's 10/16/2016   Nuclear sclerotic cataract of both eyes 10/16/2016      Assessment/Plan: - Currently blood pressure variable - Reviewed goal blood pressure <130/80 due to CKD - Counseled on long term microvascular and macrovascular complications of uncontrolled hypertension (addressed at previous visit)  - Reviewed to check blood pressure once daily, document, and provide at next provider visit - Patient has referral to nephrology (sent 09/29/2021) - called [facility] and they asked for last 2 to 4 office noted, 1 year of labs and demographic information be faxed to them for review. Forwarded requested information to our referral department. They will send needed information today.  - patient will have BMP rechecked 10/17/2021 - if blood pressure still elevated could consider increase furosemide, add low dose ARB depending on Scr, clonidine or Farxiga 34m  daily   Medication Assistance:  None required.  Patient affirms current coverage meets needs.  Follow Up:  Patient agrees to Care Plan and Follow-up.  Plan: Telephone follow up appointment with care management team member scheduled for:  1 week to check home blood pressure readings.   TCherre Robins PharmD Clinical Pharmacist LTerryPrimary Care SW MMaricopa ColonyHKind[facility] I have personally reviewed this encounter including the documentation in this note and have collaborated with the care management provider regarding care management and care coordination activities to include development and update of the comprehensive care plan. I am certifying that I agree with the content of this note and encounter as supervising physician.  I have personally reviewed this encounter including the documentation in this note and have collaborated with the care management provider regarding care management and care coordination activities to include development and update of the comprehensive care plan. I am certifying that I agree with the content of this note and encounter as supervising physician.

## 2021-10-17 ENCOUNTER — Telehealth: Payer: Self-pay | Admitting: Family

## 2021-10-17 ENCOUNTER — Other Ambulatory Visit (INDEPENDENT_AMBULATORY_CARE_PROVIDER_SITE_OTHER): Payer: Medicare Other

## 2021-10-17 DIAGNOSIS — R7989 Other specified abnormal findings of blood chemistry: Secondary | ICD-10-CM | POA: Diagnosis not present

## 2021-10-17 DIAGNOSIS — I10 Essential (primary) hypertension: Secondary | ICD-10-CM

## 2021-10-17 LAB — BASIC METABOLIC PANEL
BUN: 63 mg/dL — ABNORMAL HIGH (ref 6–23)
CO2: 24 mEq/L (ref 19–32)
Calcium: 9.8 mg/dL (ref 8.4–10.5)
Chloride: 103 mEq/L (ref 96–112)
Creatinine, Ser: 2.12 mg/dL — ABNORMAL HIGH (ref 0.40–1.20)
GFR: 22.98 mL/min — ABNORMAL LOW (ref >60.00)
Glucose, Bld: 188 mg/dL — ABNORMAL HIGH (ref 70–99)
Potassium: 4.5 mEq/L (ref 3.5–5.1)
Sodium: 138 mEq/L (ref 135–145)

## 2021-10-17 NOTE — Telephone Encounter (Signed)
Spoke to daughter, Luna Kitchens and advised her of results and Melissa's comments. She reports patient has appointment with nephrology in August.

## 2021-10-17 NOTE — Telephone Encounter (Signed)
Mariah Mason- Please advise pt that her kidney function has worsened slightly since it was checked last week.  Make sure that she is not taking any nsaids (aleve, motrin etc.). Be sure to stay well hydrated.  I see that Vernona Rieger placed a referral to nephrology.  Have they contacted her? If not, please ask her to call us if she has not heard back from them about an appointment in 1 week.  She should keep her follow up with Vernona Rieger in July.  Tammy, I know you were working on the nephrology consult coordination.  FYI.

## 2021-10-17 NOTE — Telephone Encounter (Signed)
See previous notes. Continuation.  Hydralazine causes dizziness.  Right now best options are clonidine, maybe low dose hydralazine 10mg  bid and increase as tolerated or beta blocker.

## 2021-10-17 NOTE — Telephone Encounter (Signed)
Called but no answer lvm for patient to call back 

## 2021-10-17 NOTE — Telephone Encounter (Signed)
I spoke with daughter Luna Kitchens. Reports blood pressure has decreased from SBP of 160's to 150 to 157 since furosemide 20mg  daily was started last week.  DBP has been in 70's.  Since Scr has increased - recommended patient hold furosemide. Has appointment with nephrology but not until August.  Patient's usual PCP is out of office until 10/24/2021. Will discuss options with other provider.  Patient was prescribed clonidine 2 weeks ago but daughter was concerned about side effect and did not start. She also has been prescribed hydralazine in past but caused

## 2021-10-18 ENCOUNTER — Ambulatory Visit: Payer: Medicare Other | Admitting: Pharmacist

## 2021-10-18 DIAGNOSIS — I10 Essential (primary) hypertension: Secondary | ICD-10-CM

## 2021-10-18 DIAGNOSIS — R7989 Other specified abnormal findings of blood chemistry: Secondary | ICD-10-CM

## 2021-10-18 MED ORDER — CLONIDINE HCL 0.1 MG PO TABS: 0.1000 mg | ORAL_TABLET | Freq: Every day | ORAL | Status: DC

## 2021-10-18 NOTE — Telephone Encounter (Signed)
Recommend that she start the clonidine 0.1mg  HS and follow up with Vernona Rieger in 1-2 weeks.

## 2021-10-18 NOTE — Chronic Care Management (AMB) (Addendum)
Care Management   Pharmacy Note  10/18/2021 Name: Mariah Mason MRN: [id number] DOB: Jul 08, 1950  Subjective: Mariah Mason is a 71 y.o. year old female who is a primary care patient of [name], [place]. The Care Management team was consulted for assistance with hypertension management and care coordination needs  Engaged with patient by telephone for follow up visit in response to provider referral for pharmacy case management and/or care coordination services.   The patient was given information about Care Management services today including:  Care Management services includes personalized support from designated clinical staff supervised by the patient's primary care provider, including individualized plan of care and coordination with other care providers. 24/7 contact phone numbers for assistance for urgent and routine care needs. The patient may stop case management services at any time by phone call to the office staff.  Patient agreed to services and consent obtained.  Patient appearing on report for [employer] [place] [employer]/ Was referred by primary care provider due to uncontrolled hypertension and CKD.   BP Readings from Last 3 Encounters:  10/10/21 (!) 162/67  10/05/21 136/62  10/02/21 (!) 177/93    Current antihypertensives: amlodipine 79m daily each morning and carvedilol 253mtwice a day; started furosemide 2081maily on 10/10/2021 but BMP checked 10/17/2021 showed increase in serum creatinine. Holding furosemide currently Home blood pressure had improved a little with addition of furosemide. Blood pressure has decreased from SBP of 160's to 150 to 157.  DBP has been in 70's. Patient was prescribed clonidine 0.1mg69mch evening but did not start due to family's concern with side effects. Also was prescribed hydralazine 25mg24m patient tried in past and was not able to tolerate - caused dizziness. Hydralazine 10mg 2mrecommended but patient never started.   Patient  has new automated upper arm home BP machine.  Patient denies hypotensive signs and symptoms  Patient reports hypertensive symptoms including: headache (earlier this week but not in the last 7 days)  Assessment:  Review of patient status, including review of consultants reports, laboratory and other test data, was performed as part of comprehensive evaluation and provision of chronic care management services.   SDOH (Social Determinants of Health) assessments and interventions performed:    Objective:  Lab Results  Component Value Date   CREATININE 2.12 (H) 10/17/2021   CREATININE 1.79 (H) 10/10/2021   CREATININE 1.66 (H) 10/02/2021    Lab Results  Component Value Date   HGBA1C 7.3 (H) 09/26/2021       Component Value Date/Time   CHOL 202 (H) 03/28/2021 1507   CHOL 206 (H) 12/09/2020 0908   TRIG 176.0 (H) 03/28/2021 1507   HDL 35.30 (L) 03/28/2021 1507   HDL 33 (L) 12/09/2020 0908   CHOLHDL 6 03/28/2021 1507   VLDL 35.2 03/28/2021 1507   LDLCALC 132 (H) 03/28/2021 1507   LDLCALC 138 (H) 12/09/2020 0908   Clinical ASCVD: Yes  The 10-year ASCVD risk score (Arnett DK, et al., 2019) is: 37.7%   Values used to calculate the score:     Age: 58 yea66     Sex: Female     Is Non-Hispanic African American: Yes     Diabetic: Yes     Tobacco smoker: No     Systolic Blood Pressure: 162 mm678    Is BP treated: Yes     HDL Cholesterol: 35.3 mg/dL     Total Cholesterol: 202 mg/dL    BP Readings from Last 3 Encounters:  10/10/21 (!)Marland Kitchen  162/67  10/05/21 136/62  10/02/21 (!) 177/93    Care Plan  Allergies  Allergen Reactions   Erythromycin Other (See Comments)    Does not remember reaction.   Hydralazine Other (See Comments)    Not able to tolerate per patient; caused her to feel dizzy    Medications Reviewed Today     Reviewed by Cherre Robins, RPH-CPP (Pharmacist) on 10/13/21 at [place] [facility] Status: <None>   Medication Order Taking? Sig Documenting Provider Last Dose  Status Informant  ACCU-CHEK GUIDE test strip [id number] Yes TEST BLOOD SUGAR ONCE DAILY [provider] Taking Active   Accu-Chek Softclix Lancets lancets [id number] Yes 4 (four) times daily. [provider] Taking Active   allopurinol (ZYLOPRIM) 100 MG tablet [id number] Yes TAKE 1 TABLET(100 MG) BY MOUTH DAILY [name], FNP Taking Active   amLODipine (NORVASC) 10 MG tablet [id number] Yes TAKE 1 TABLET(10 MG) BY MOUTH DAILY [name], FNP Taking Active   atorvastatin (LIPITOR) 20 MG tablet [id number] Yes Take 1 tablet (20 mg total) by mouth daily. [name], FNP Taking Active   blood glucose meter kit and supplies KIT [id number] Yes Dispense based on patient and insurance preference. Use up to four times daily as directed. (FOR ICD-9 250.00, 250.01). [name], FNP Taking Active   carvedilol (COREG) 25 MG tablet [id number] Yes TAKE 1 TABLET(25 MG) BY MOUTH TWICE DAILY WITH A MEAL [name], FNP Taking Active   cholecalciferol (VITAMIN D3) 25 MCG (1000 UT) tablet [id number] Yes Take 1,000 Units by mouth daily. [provider] Taking Active Child  colchicine 0.6 MG tablet [id number] Yes Take 2 tablets (1.15m) by mouth at onset for gout, may repeat 1 tablet 0.642min 2 hours is symptoms persist MuMarrian SalvageFNP Taking Active   cycloSPORINE (RESTASIS) 0.05 % ophthalmic emulsion [id number] Place 1 drop into both eyes 2 (two) times daily.  [provider] Taking Active Child  Elastic Bandages & Supports (MEDICAL COMPRESSION SOCKS) MISC [id number] Compression socks for DM peripheral neuropathy and bilateral leg swelling YaMarcial PacasMD Taking Active            Med Note ([facility] Center[facility] Clanov 7, 2022  1:26 PM)    furosemide (LASIX) 20 MG tablet [id number] Take 1 tablet (20 mg total) by mouth daily. MuMarrian SalvageFNP Taking Active   Misc. Devices MIJoshua [id number] Shower chair. Dx-  Diabetes mellitus NeCharlott RakesMD Taking Active   Multiple Vitamins-Minerals (CENTRUM SILVER ADULT 50+ PO) [id number] Take 1 tablet by mouth daily. [provider] Taking Active Child  NONFORMULARY OR COMPOUNDED ITEM [id number] Peripheral Neuropathy Cream: Bupivacaine 1%, Doxepin 3%, Gabapentin 6%, Pentoxifylline 3%, Topiramate 1% Order faxed to [facility] InstJeStephani PoliceDPConnecticutaking Active   sitaGLIPtin (JANUVIA) 50 MG tablet [id number] TAKE 1 TABLET(50 MG) BY MOUTH DAILY MuMarrian SalvageFNP Taking Active             Patient Active Problem List   Diagnosis Date Noted   Bilateral lumbar radiculopathy 08/22/2020   DM type 2 with diabetic peripheral neuropathy (HC[facility]04/08/2020   Gait abnormality 08/02/2020   Gout    Hypertension    Type 2 diabetes mellitus (HCHixton10/21/2020   AKI (acute kidney injury) (HCOvied[place]10/21/2020   Hyperkalemia 1073/42/8768 Metabolic acidosis 1011/57/2620 Hypoglycemia 02/17/2019   Long term current use of oral hypoglycemic drug 05/23/2017  Presbyopia of both eyes 05/23/2017   Vitreous floaters of both eyes 05/23/2017   Keratoconjunctivitis sicca of both eyes not specified as Sjogren's 10/16/2016   Nuclear sclerotic cataract of both eyes 10/16/2016      Assessment/Plan: - Currently blood pressure variable and not at goal - Start clonidine 0.61m daily at bedtime - Reviewed goal blood pressure <130/80 due to CKD - Continue with [facility] to [facility], nephrolog[facility] - has appointment for August 2023.  - Recommended she continue to check blood pressure once daily, document, and provide at next provider visit - follow up in 5 to 7 days to check home blood glucose readings.   Medication Assistance:  None required.  Patient affirms current coverage meets needs.  Follow Up:  Patient agrees to Care Plan and Follow-up.  Plan: Telephone follow up appointment with care management team member scheduled for:  1 week to  check home blood pressure readings.   TCherre Robins PharmD Clinical Pharmacist LMc[facility]adePrimary Care SW [facility]La[facility]   I have personally reviewed this encounter including the documentation in this note and have collaborated with the care management provider regarding care management and care coordination activities to include development and update of the comprehensive care plan. I am certifying that I agree with the content of this note and encounter as supervising physician.

## 2021-10-18 NOTE — Telephone Encounter (Signed)
See phone visit notes from 10/18/2021

## 2021-10-19 ENCOUNTER — Telehealth: Payer: Self-pay

## 2021-10-19 NOTE — Telephone Encounter (Signed)
Pt's daughter called- requesting latest labs be faxed to ATTN: Heather at [facility] at [phone]. Results have been faxed.

## 2021-10-19 NOTE — Telephone Encounter (Signed)
Patient's daughter wanted a note in the chart to let us know she was disappointed that her mother's kidneys were failing and she had recently stared furosemide. "If she wouldn't mention the furosemide the provider wouldn't have notice. She is grateful to Tammy " for taking the time to look into it and having patient discontinue medication after bringing it up to the provider's attention".

## 2021-10-25 ENCOUNTER — Ambulatory Visit: Payer: Medicare Other | Admitting: Pharmacist

## 2021-10-25 DIAGNOSIS — I10 Essential (primary) hypertension: Secondary | ICD-10-CM

## 2021-10-25 NOTE — Chronic Care Management (AMB) (Cosign Needed)
Care Management   Pharmacy Note  10/25/2021 Name: Mariah Mason MRN: [id number] DOB: 07-11-1950  Subjective: Mariah Mason is a 71 y.o. year old female who is a primary care patient of [name], [place]. The Care Management team was consulted for assistance with hypertension management and care coordination needs  Engaged with patient by telephone for follow up visit in response to provider referral for pharmacy case management and/or care coordination services.   The patient was given information about Care Management services today including:  Care Management services includes personalized support from designated clinical staff supervised by the patient's primary care provider, including individualized plan of care and coordination with other care providers. 24/7 contact phone numbers for assistance for urgent and routine care needs. The patient may stop case management services at any time by phone call to the office staff.  Patient agreed to services and consent obtained.  Patient started clonidine 0.37m at bedtime last week. She did have a little drowsiness and dizziness the first 2 to 3 days but reports improved.  Current antihypertensives: amlodipine 19mdaily each morning, carvedilol 2587mwice a day and clinidine 0.1mg5m bedtime Tried furosemide 20mg49mly on 10/10/2021 but BMP checked 10/17/2021 showed increase in serum creatinine. Holding furosemide currently Prescribed hydralazine 25mg 66mast but patient tried and was not able to tolerate - caused dizziness. Hydralazine 10mg w47mecommended but patient never started.   Home blood pressure has been as follows:   6/21 - 149/77  6/22 - 150/70  6/23 - 147/75  6/24 - 137/72  6/25 - 143/75  6/26 - 139/72  6/27 - 140/70  6/28 - 138/73  Daughter reports patient's anxiety has improved (she was really anxious about elevated blood pressure and kidney function). Patient also reports she is feeling better and has been  more active recently.   BP Readings from Last 3 Encounters:  10/10/21 (!) 162/67  10/05/21 136/62  10/02/21 (!) 177/93     Assessment:  Review of patient status, including review of consultants reports, laboratory and other test data, was performed as part of comprehensive evaluation and provision of chronic care management services.   SDOH (Social Determinants of Health) assessments and interventions performed:    Objective:  Lab Results  Component Value Date   CREATININE 2.12 (H) 10/17/2021   CREATININE 1.79 (H) 10/10/2021   CREATININE 1.66 (H) 10/02/2021    Lab Results  Component Value Date   HGBA1C 7.3 (H) 09/26/2021       Component Value Date/Time   CHOL 202 (H) 03/28/2021 1507   CHOL 206 (H) 12/09/2020 0908   TRIG 176.0 (H) 03/28/2021 1507   HDL 35.30 (L) 03/28/2021 1507   HDL 33 (L) 12/09/2020 0908   CHOLHDL 6 03/28/2021 1507   VLDL 35.2 03/28/2021 1507   LDLCALC 132 (H) 03/28/2021 1507   LDLCALC 138 (H) 12/09/2020 0908   Clinical ASCVD: Yes  The 10-year ASCVD risk score (Arnett DK, et al., 2019) is: 37.7%   Values used to calculate the score:     Age: 36 year69    Sex: Female     Is Non-Hispanic African American: Yes     Diabetic: Yes     Tobacco smoker: No     Systolic Blood Pressure: 162 mmH614   Is BP treated: Yes     HDL Cholesterol: 35.3 mg/dL     Total Cholesterol: 202 mg/dL    BP Readings from Last 3 Encounters:  10/10/21 (!) 162/67  10/05/21 136/62  10/02/21 (!) 177/93    Care Plan  Allergies  Allergen Reactions   Erythromycin Other (See Comments)    Does not remember reaction.   Hydralazine Other (See Comments)    Not able to tolerate per patient; caused her to feel dizzy    Medications Reviewed Today     Reviewed by Cherre Robins, RPH-CPP (Pharmacist) on 10/25/21 at [place] List Status: <None>   Medication Order Taking? Sig Documenting Provider Last Dose Status Informant  ACCU-CHEK GUIDE test strip [id number] Yes TEST BLOOD  SUGAR ONCE DAILY [provider] Taking Active   Accu-Chek Softclix Lancets lancets [id number] Yes 4 (four) times daily. [provider] Taking Active   allopurinol (ZYLOPRIM) 100 MG tablet [id number] Yes TAKE 1 TABLET(100 MG) BY MOUTH DAILY [name], FNP Taking Active   amLODipine (NORVASC) 10 MG tablet [id number] Yes TAKE 1 TABLET(10 MG) BY MOUTH DAILY [name], FNP Taking Active   atorvastatin (LIPITOR) 20 MG tablet [id number] Yes Take 1 tablet (20 mg total) by mouth daily. [name], FNP Taking Active   blood glucose meter kit and supplies KIT [id number] Yes Dispense based on patient and insurance preference. Use up to four times daily as directed. (FOR ICD-9 250.00, 250.01). [name], FNP Taking Active   carvedilol (COREG) 25 MG tablet [id number] Yes TAKE 1 TABLET(25 MG) BY MOUTH TWICE DAILY WITH A MEAL [name], FNP Taking Active   cholecalciferol (VITAMIN D3) 25 MCG (1000 UT) tablet [id number] Yes Take 1,000 Units by mouth daily. [provider] Taking Active Child  cloNIDine (CATAPRES) 0.1 MG tablet [id number] Yes Take 1 tablet (0.1 mg total) by mouth at bedtime. Cherre Robins, RPH-CPP Taking Active   colchicine 0.6 MG tablet [id number] Yes Take 2 tablets (1.76m) by mouth at onset for gout, may repeat 1 tablet 0.635min 2 hours is symptoms persist MuMarrian SalvageFNP Taking Active   cycloSPORINE (RESTASIS) 0.05 % ophthalmic emulsion [id number] Place 1 drop into both eyes 2 (two) times daily.  [provider] Taking Active Child  Elastic Bandages & Supports (MEDICAL COMPRESSION SOCKS) MISC [id number] Compression socks for DM peripheral neuropathy and bilateral leg swelling YaMarcial PacasMD Taking Active            Med Note (KJavon Bea [facility] Rockton AveMIElson Clanov 7, 2022  1:26 PM)    furosemide (LASIX) 20 MG tablet [id number] Take 1 tablet (20 mg total) by mouth daily.  Patient not taking: Reported  on 10/25/2021   MuMarrian SalvageFNSeilingot Taking Active            Med Note (EParrish Medical CenterTAElimun 21, 2023  5:08 PM) holding  Misc. Devices [id number] Shower chair. Dx- Diabetes mellitus NeCharlott RakesMD Taking Active   Multiple Vitamins-Minerals (CENTRUM SILVER ADULT 50+ PO) [id number] Take 1 tablet by mouth daily. [provider] Taking Active Child  NONFORMULARY OR COMPOUNDED ITEM [id number] Peripheral Neuropathy Cream: Bupivacaine 1%, Doxepin 3%, Gabapentin 6%, Pentoxifylline 3%, Topiramate 1% Order faxed to CaNorth[facility] CenterJeStephani PoliceDPConnecticutaking Active   sitaGLIPtin (JANUVIA) 50 MG tablet [id number] TAKE 1 TABLET(50 MG) BY MOUTH DAILY MuMarrian SalvageFNP Taking Active             Patient Active Problem List   Diagnosis Date Noted   Bilateral lumbar radiculopathy 08/22/2020   DM type 2 with diabetic peripheral  neuropathy ([place]) 08/02/2020   Gait abnormality 08/02/2020   Gout    Hypertension    Type 2 diabetes mellitus ([place]) 02/18/2019   AKI (acute kidney injury) ([place]) 02/18/2019   Hyperkalemia 57/50/5183   Metabolic acidosis 35/82/5189   Hypoglycemia 02/17/2019   Long term current use of oral hypoglycemic drug 05/23/2017   Presbyopia of both eyes 05/23/2017   Vitreous floaters of both eyes 05/23/2017   Keratoconjunctivitis sicca of both eyes not specified as Sjogren's 10/16/2016   Nuclear sclerotic cataract of both eyes 10/16/2016      Assessment/Plan: - Currently blood pressure over the last 5 days has improved with 3 of 5 days blood pressure has been <140/90 - Continue current blood pressure regimen - Reviewed goal blood pressure <130/80 due to CKD but will given clonidine a few more days to lower blood pressure and then consider increase in dose. - Continue with plan to see [facility], nephrologist - has appointment for August 2023.  - Recommended she continue to check blood pressure once daily,  document, and provide at next provider visit - follow up in 5 to 7 days to check home blood pressure readings.   Medication Assistance:  None required.  Patient affirms current coverage meets needs.  Follow Up:  Patient agrees to Care Plan and Follow-up.  Plan: Telephone follow up appointment with care management team member scheduled for:  1 week to check home blood pressure readings.   Cherre Robins, PharmD Clinical Pharmacist [facility] SW [facility]  I have personally reviewed this encounter including the documentation in this note and have collaborated with the care management provider regarding care management and care coordination activities to include development and update of the comprehensive care plan. I am certifying that I agree with the content of this note and encounter as supervising physician.

## 2021-11-01 ENCOUNTER — Ambulatory Visit: Payer: Medicare Other | Admitting: Pharmacist

## 2021-11-01 DIAGNOSIS — Z794 Long term (current) use of insulin: Secondary | ICD-10-CM

## 2021-11-01 DIAGNOSIS — I10 Essential (primary) hypertension: Secondary | ICD-10-CM

## 2021-11-01 DIAGNOSIS — R7989 Other specified abnormal findings of blood chemistry: Secondary | ICD-10-CM

## 2021-11-01 MED ORDER — SITAGLIPTIN PHOSPHATE 50 MG PO TABS: 25.0000 mg | ORAL_TABLET | Freq: Every day | ORAL | 0 refills | Status: DC

## 2021-11-01 NOTE — Chronic Care Management (AMB) (Cosign Needed)
Care Management   Pharmacy Note  11/01/2021 Name: Mariah Mason MRN: [id number] DOB: May 27, 1950  Subjective: Mariah Mason is a 71 y.o. year old female who is a primary care patient of [name], [place]. The Care Management team was consulted for assistance with hypertension management and care coordination needs  Engaged with patient by telephone for follow up visit in response to provider referral for pharmacy case management and/or care coordination services.   The patient was given information about Care Management services today including:  Care Management services includes personalized support from designated clinical staff supervised by the patient's primary care provider, including individualized plan of care and coordination with other care providers. 24/7 contact phone numbers for assistance for urgent and routine care needs. The patient may stop case management services at any time by phone call to the office staff.  Patient agreed to services and consent obtained.  Patient started clonidine 0.39m at bedtime 2 weeks ago. She did have a little drowsiness and dizziness the first 2 to 3 days but reports improved.  Current antihypertensives: amlodipine 169mdaily each morning, carvedilol 25100mwice a day and clinidine 0.1mg72m bedtime Tried furosemide 20mg91mly on 10/10/2021 but BMP checked 10/17/2021 showed increase in serum creatinine. Holding furosemide currently Prescribed hydralazine 25mg 76mast but patient tried and was not able to tolerate - caused dizziness. Hydralazine 10mg w24mecommended but patient never started.   Home blood pressure has been as follows:   6/21 - 149/77  6/22 - 150/70  6/23 - 147/75  6/24 - 137/72  6/25 - 143/75  6/26 - 139/72  6/27 - 140/70  6/28 - 138/73 Has not taken since 06/28 - patient states arm was swollen and did not blood pressure for the last 5 days. Patient reports swelling has improved and will check blood pressure today.    Daughter reports patient's anxiety has improved (she was really anxious about elevated blood pressure and kidney function). Patient also reports she is feeling better and has been more active recently.   BP Readings from Last 3 Encounters:  10/10/21 (!) 162/67  10/05/21 136/62  10/02/21 (!) 177/93 979/89 metabolic panel    Assessment:  Review of patient status, including review of consultants reports, laboratory and other test data, was performed as part of comprehensive evaluation and provision of chronic care management services.   SDOH (Social Determinants of Health) assessments and interventions performed:    Objective:  Lab Results  Component Value Date   CREATININE 2.12 (H) 10/17/2021   CREATININE 1.79 (H) 10/10/2021   CREATININE 1.66 (H) 10/02/2021    Lab Results  Component Value Date   HGBA1C 7.3 (H) 09/26/2021       Component Value Date/Time   CHOL 202 (H) 03/28/2021 1507   CHOL 206 (H) 12/09/2020 0908   TRIG 176.0 (H) 03/28/2021 1507   HDL 35.30 (L) 03/28/2021 1507   HDL 33 (L) 12/09/2020 0908   CHOLHDL 6 03/28/2021 1507   VLDL 35.2 03/28/2021 1507   LDLCALC 132 (H) 03/28/2021 1507   LDLCALC 138 (H) 12/09/2020 0908   Clinical ASCVD: Yes  The 10-year ASCVD risk score (Arnett DK, et al., 2019) is: 37.7%   Values used to calculate the score:     Age: 50 year38    Sex: Female     Is Non-Hispanic African American: Yes     Diabetic: Yes     Tobacco smoker: No     Systolic Blood Pressure: 162211  mmHg     Is BP treated: Yes     HDL Cholesterol: 35.3 mg/dL     Total Cholesterol: 202 mg/dL    BP Readings from Last 3 Encounters:  10/10/21 (!) 162/67  10/05/21 136/62  10/02/21 (!) 177/93      Allergies  Allergen Reactions   Erythromycin Other (See Comments)    Does not remember reaction.   Hydralazine Other (See Comments)    Not able to tolerate per patient; caused her to feel dizzy    Medications Reviewed Today     Reviewed by Cherre Robins,  RPH-CPP (Pharmacist) on 10/25/21 at [facility] List Status: <None>   Medication Order Taking? Sig Documenting Provider Last Dose Status Informant  ACCU-CHEK GUIDE test strip [id number] Yes TEST BLOOD SUGAR ONCE DAILY [provider] Taking Active   Accu-Chek Softclix Lancets lancets [id number] Yes 4 (four) times daily. [provider] Taking Active   allopurinol (ZYLOPRIM) 100 MG tablet [id number] Yes TAKE 1 TABLET(100 MG) BY MOUTH DAILY [name], FNP Taking Active   amLODipine (NORVASC) 10 MG tablet [id number] Yes TAKE 1 TABLET(10 MG) BY MOUTH DAILY [name], FNP Taking Active   atorvastatin (LIPITOR) 20 MG tablet [id number] Yes Take 1 tablet (20 mg total) by mouth daily. [name], FNP Taking Active   blood glucose meter kit and supplies KIT [id number] Yes Dispense based on patient and insurance preference. Use up to four times daily as directed. (FOR ICD-9 250.00, 250.01). [name], FNP Taking Active   carvedilol (COREG) 25 MG tablet [id number] Yes TAKE 1 TABLET(25 MG) BY MOUTH TWICE DAILY WITH A MEAL [name], FNP Taking Active   cholecalciferol (VITAMIN D3) 25 MCG (1000 UT) tablet [id number] Yes Take 1,000 Units by mouth daily. [provider] Taking Active Child  cloNIDine (CATAPRES) 0.1 MG tablet [id number] Yes Take 1 tablet (0.1 mg total) by mouth at bedtime. Cherre Robins, RPH-CPP Taking Active   colchicine 0.6 MG tablet [id number] Yes Take 2 tablets (1.64m) by mouth at onset for gout, may repeat 1 tablet 0.634min 2 hours is symptoms persist MuMarrian SalvageFNP Taking Active   cycloSPORINE (RESTASIS) 0.05 % ophthalmic emulsion [id number] Place 1 drop into both eyes 2 (two) times daily.  [provider] Taking Active Child  Elastic Bandages & Supports (MEDICAL COMPRESSION SOCKS) MISC [id number] Compression socks for DM peripheral neuropathy and bilateral leg swelling YaMarcial PacasMD Taking  Active            Med Note ([facility]MIElson Clanov 7, 2022  1:26 PM)    furosemide (LASIX) 20 MG tablet [id number] Take 1 tablet (20 mg total) by mouth daily.  Patient not taking: Reported on 10/25/2021   MuMarrian SalvageFNRenickot Taking Active            Med Note (EN[facility] Hospital BeaufortTABethel Springsun 21, 2023  5:08 PM) holding  Misc. Devices MI[place] [id number] Shower chair. Dx- Diabetes mellitus NeCharlott RakesMD Taking Active   Multiple Vitamins-Minerals (CENTRUM SILVER ADULT 50+ PO) [id number] Take 1 tablet by mouth daily. [provider] Taking Active Child  NONFORMULARY OR COMPOUNDED ITEM [id number] Peripheral Neuropathy Cream: Bupivacaine 1%, Doxepin 3%, Gabapentin 6%, Pentoxifylline 3%, Topiramate 1% Order faxed to [facility]JeStephani PoliceDPConnecticutaking Active   sitaGLIPtin (JANUVIA) 50 MG tablet [id number] TAKE 1 TABLET(50 MG) BY MOUTH DAILY MuMarrian Salvage  FNP Taking Active             Patient Active Problem List   Diagnosis Date Noted   Bilateral lumbar radiculopathy 08/22/2020   DM type 2 with diabetic peripheral neuropathy ([place]) 08/02/2020   Gait abnormality 08/02/2020   Gout    Hypertension    Type 2 diabetes mellitus ([place]) 02/18/2019   AKI (acute kidney injury) ([place]) 02/18/2019   Hyperkalemia 53/00/5110   Metabolic acidosis 21/03/7355   Hypoglycemia 02/17/2019   Long term current use of oral hypoglycemic drug 05/23/2017   Presbyopia of both eyes 05/23/2017   Vitreous floaters of both eyes 05/23/2017   Keratoconjunctivitis sicca of both eyes not specified as Sjogren's 10/16/2016   Nuclear sclerotic cataract of both eyes 10/16/2016      Assessment:  Hypertension - blood pressure improving blood pressure goal < 130/80 (due to CKD CKD - has appointment for initial consult with nephrologist in August 2023. Type 2 DM  - currently taking Januvia 78m daily; A1c goal < 7.0%  Plan: - blood pressure improving. Continue  current blood pressure regimen - Reviewed goal blood pressure <130/80 due to CKD but will give clonidine a few more days to lower blood pressure and then consider increase in dose. - Continue with plan to see CBethel ParkKidney, nephrologist - has appointment for August 2023.  - Recommended she restart checking blood pressure every other day. Discuss arm swelling with PCP at upcoming appointment 11/09/2021 - lower dose of Januvia to 297mdaily due to eGFR of 3055min - patient will take 0.5 tablet of current prescription for Januvia 34m19m Medication Assistance:  None required.  Patient affirms current coverage meets needs.  Follow Up:  Patient agrees to Care Plan and Follow-up.  Plan: Telephone follow up appointment with care management team member scheduled for:  3 to 4 weeks  Will see PCP 11/09/2021  TammCherre RobinsarmD Clinical Pharmacist [facility] SW MedCButterfieldhConcord Hospitalhave personally reviewed this encounter including the documentation in this note and have collaborated with the care management provider regarding care management and care coordination activities to include development and update of the comprehensive care plan. I am certifying that I agree with the content of this note and encounter as supervising physician.

## 2021-11-02 ENCOUNTER — Telehealth: Payer: Self-pay | Admitting: Family

## 2021-11-02 NOTE — Telephone Encounter (Signed)
Great blood pressure reading. Patient will see PCP 11/09/2021.

## 2021-11-02 NOTE — Telephone Encounter (Signed)
Pt daughter is calling to follow up on the appt her mom had with Tammy on 07/06. She stated her mom's bp was 133/73 yesterday eveining.

## 2021-11-06 ENCOUNTER — Telehealth: Payer: Self-pay | Admitting: Pharmacist

## 2021-11-06 NOTE — Telephone Encounter (Signed)
Pt's daughter wanted to make Mariah Mason aware that after taking 1/2 januvia pill blood sugar readings were 163 (fri) and 169 (sat). After taking a full pill blood sugar was 150 Sunday and Monday.

## 2021-11-09 ENCOUNTER — Ambulatory Visit (INDEPENDENT_AMBULATORY_CARE_PROVIDER_SITE_OTHER): Payer: Medicare Other | Admitting: Family

## 2021-11-09 VITALS — BP 142/82 | HR 77 | Temp 98.2°F | Resp 16 | Ht 65.0 in | Wt 199.0 lb

## 2021-11-09 DIAGNOSIS — I10 Essential (primary) hypertension: Secondary | ICD-10-CM

## 2021-11-09 DIAGNOSIS — E1142 Type 2 diabetes mellitus with diabetic polyneuropathy: Secondary | ICD-10-CM

## 2021-11-09 DIAGNOSIS — M24542 Contracture, left hand: Secondary | ICD-10-CM

## 2021-11-09 DIAGNOSIS — R7989 Other specified abnormal findings of blood chemistry: Secondary | ICD-10-CM

## 2021-11-09 LAB — COMPREHENSIVE METABOLIC PANEL WITH GFR
ALT: 10 U/L (ref 0–35)
AST: 12 U/L (ref 0–37)
Albumin: 4.4 g/dL (ref 3.5–5.2)
Alkaline Phosphatase: 59 U/L (ref 39–117)
BUN: 51 mg/dL — ABNORMAL HIGH (ref 6–23)
CO2: 23 meq/L (ref 19–32)
Calcium: 9.8 mg/dL (ref 8.4–10.5)
Chloride: 106 meq/L (ref 96–112)
Creatinine, Ser: 1.81 mg/dL — ABNORMAL HIGH (ref 0.40–1.20)
GFR: 27.77 mL/min — ABNORMAL LOW (ref >60.00)
Glucose, Bld: 169 mg/dL — ABNORMAL HIGH (ref 70–99)
Potassium: 4.6 meq/L (ref 3.5–5.1)
Sodium: 137 meq/L (ref 135–145)
Total Bilirubin: 0.4 mg/dL (ref 0.2–1.2)
Total Protein: 7.6 g/dL (ref 6.0–8.3)

## 2021-11-09 MED ORDER — ACCU-CHEK GUIDE VI STRP: ORAL_STRIP | 3 refills | Status: DC

## 2021-11-09 NOTE — Progress Notes (Signed)
Mariah Mason is a 71 y.o. female with the following history as recorded in EpicCare:  Patient Active Problem List   Diagnosis Date Noted   Bilateral lumbar radiculopathy 08/22/2020   DM type 2 with diabetic peripheral neuropathy ([place]) 08/02/2020   Gait abnormality 08/02/2020   Gout    Hypertension    Type 2 diabetes mellitus ([place]) 02/18/2019   AKI (acute kidney injury) ([place]) 02/18/2019   Hyperkalemia 16/01/9603   Metabolic acidosis 54/12/8117   Hypoglycemia 02/17/2019   Long term current use of oral hypoglycemic drug 05/23/2017   Presbyopia of both eyes 05/23/2017   Vitreous floaters of both eyes 05/23/2017   Keratoconjunctivitis sicca of both eyes not specified as Sjogren's 10/16/2016   Nuclear sclerotic cataract of both eyes 10/16/2016    Current Outpatient Medications  Medication Sig Dispense Refill   Accu-Chek Softclix Lancets lancets 4 (four) times daily.     allopurinol (ZYLOPRIM) 100 MG tablet TAKE 1 TABLET(100 MG) BY MOUTH DAILY 90 tablet 3   amLODipine (NORVASC) 10 MG tablet TAKE 1 TABLET(10 MG) BY MOUTH DAILY 90 tablet 3   atorvastatin (LIPITOR) 20 MG tablet Take 1 tablet (20 mg total) by mouth daily. 90 tablet 3   blood glucose meter kit and supplies KIT Dispense based on patient and insurance preference. Use up to four times daily as directed. (FOR ICD-9 250.00, 250.01). 1 each 0   carvedilol (COREG) 25 MG tablet TAKE 1 TABLET(25 MG) BY MOUTH TWICE DAILY WITH A MEAL 180 tablet 3   cholecalciferol (VITAMIN D3) 25 MCG (1000 UT) tablet Take 1,000 Units by mouth daily.     cloNIDine (CATAPRES) 0.1 MG tablet Take 1 tablet (0.1 mg total) by mouth at bedtime. 90 tablet    colchicine 0.6 MG tablet Take 2 tablets (1.87m) by mouth at onset for gout, may repeat 1 tablet 0.65min 2 hours is symptoms persist 30 tablet 1   cycloSPORINE (RESTASIS) 0.05 % ophthalmic emulsion Place 1 drop into both eyes 2 (two) times daily.      Elastic Bandages & Supports (MEDICAL COMPRESSION SOCKS)  MISC Compression socks for DM peripheral neuropathy and bilateral leg swelling 2 each 3   Misc. Devices MIStarkshower chair. Dx- Diabetes mellitus 1 each 0   Multiple Vitamins-Minerals (CENTRUM SILVER ADULT 50+ PO) Take 1 tablet by mouth daily.     NONFORMULARY OR COMPOUNDED ITEM Peripheral Neuropathy Cream: Bupivacaine 1%, Doxepin 3%, Gabapentin 6%, Pentoxifylline 3%, Topiramate 1% Order faxed to [facility] each 3   sitaGLIPtin (JANUVIA) 50 MG tablet Take 0.5 tablets (25 mg total) by mouth daily. 45 tablet 0   ACCU-CHEK GUIDE test strip TEST BLOOD SUGAR ONCE DAILY 100 each 3   No current facility-administered medications for this visit.    Allergies: Erythromycin and Hydralazine  Past Medical History:  Diagnosis Date   Diabetes mellitus without complication ([place] [place]   Gout    Hyperlipemia    Hypertension    Neuropathy     Past Surgical History:  Procedure Laterality Date   CYST EXCISION N/A 08/30/2020   Procedure: EXCISION 1X2 cm LEFT NECK SEBACEOUS CYST EXCISION 2X2 CM RIGHT BACK SEBACEOUS CYST;  Surgeon: WaRolm BookbinderMD;  [facility] Service: General;  Laterality: N/A;   THYROID SURGERY      Family History  Problem Relation Age of Onset   Heart attack Mother    Kidney failure Father     Social History   Tobacco Use   Smoking  status: Never   Smokeless tobacco: Never  Substance Use Topics   Alcohol use: Never    Subjective:   Follow up on hypertension- tolerating Clonidine well; Will be seeing nephrology early next month; Did have to go back to full dosage of Januvia- blood sugar was not controlled on 1/2 tablet;      Objective:  Vitals:   11/09/21 0906  BP: (!) 142/82  Pulse: 77  Resp: 16  Temp: 98.2 F (36.8 C)  TempSrc: Oral  SpO2: 99%  Weight: 199 lb (90.3 kg)  Height: 5' 5"  (1.651 m)    General: Well developed, well nourished, in no acute distress  Skin : Warm and dry.  Head: Normocephalic and atraumatic   Lungs: Respirations unlabored; clear to auscultation bilaterally without wheeze, rales, rhonchi  CVS exam: normal rate and regular rhythm.  Neurologic: Alert and oriented; speech intact; face symmetrical; moves all extremities well; CNII-XII intact without focal deficit   Assessment:  1. Contracture of left hand   2. Elevated serum creatinine   3. Primary hypertension   4. DM type 2 with diabetic peripheral neuropathy ([place])     Plan:  Refer to orthopedist; Update labs today; already scheduled to see nephrology; Stable; good improvement; continue same medications; Currently taking Januvia 50 mg-  depending on creatinine, to consider changing to [place] or [place].   Time spent 30 minutes  No follow-ups on file.  Orders Placed This Encounter  Procedures   Comp Met (CMET)   Ambulatory referral to Orthopedic Surgery    Referral Priority:   Routine    Referral Type:   Surgical    Referral Reason:   Specialty Services Required    Requested Specialty:   Orthopedic Surgery    Number of Visits Requested:   1    Requested Prescriptions   Signed Prescriptions Disp Refills   ACCU-CHEK GUIDE test strip 100 each 3    Sig: TEST BLOOD SUGAR ONCE DAILY

## 2021-11-09 NOTE — Telephone Encounter (Signed)
Patient to discuss further with PCP

## 2021-11-10 ENCOUNTER — Ambulatory Visit: Payer: Medicare Other | Admitting: Family

## 2021-11-10 ENCOUNTER — Other Ambulatory Visit: Payer: Self-pay | Admitting: Family

## 2021-11-10 MED ORDER — DAPAGLIFLOZIN PROPANEDIOL 5 MG PO TABS: 5.0000 mg | ORAL_TABLET | Freq: Every day | ORAL | 1 refills | Status: DC

## 2021-12-08 ENCOUNTER — Telehealth: Payer: Self-pay | Admitting: Pharmacist

## 2021-12-08 ENCOUNTER — Ambulatory Visit (INDEPENDENT_AMBULATORY_CARE_PROVIDER_SITE_OTHER): Payer: Medicare Other | Admitting: Podiatry

## 2021-12-08 ENCOUNTER — Encounter: Payer: Self-pay | Admitting: Podiatry

## 2021-12-08 DIAGNOSIS — E1142 Type 2 diabetes mellitus with diabetic polyneuropathy: Secondary | ICD-10-CM | POA: Diagnosis not present

## 2021-12-08 DIAGNOSIS — B351 Tinea unguium: Secondary | ICD-10-CM

## 2021-12-08 DIAGNOSIS — M79609 Pain in unspecified limb: Secondary | ICD-10-CM | POA: Diagnosis not present

## 2021-12-08 NOTE — Telephone Encounter (Signed)
Spoke with patient's daughter today. Patient has had appointment with nephrologist. Started valsartan80mg  daily for blood pressure and renal protection. Will recheck BMP in 2 weeks. Patient continues to tolerate [place] 5mg  daily.  Nephrologist considering stopping clonidine in future if blood pressure is at goal.  Home blood pressure readings have been 120 to 135 / 70 to 78.   Congratulated patient and family on improved blood pressure.

## 2021-12-17 NOTE — Progress Notes (Signed)
  Subjective:  Patient ID: Mariah Mason, female    DOB: 1950-06-12,  MRN: [id number]  Danyah Guastella presents to clinic today for at risk foot care with history of diabetic neuropathy and painful thick toenails that are difficult to trim. Pain interferes with ambulation. Aggravating factors include wearing enclosed shoe gear. Pain is relieved with periodic professional debridement.  Patient states blood glucose was 145 mg/dl today. Last known HgA1c was unknown.    New problem(s): None.   Her daughter is present during today's visit.  PCP is Olive Bass, FNP , and last visit was  November 09, 2021  Allergies  Allergen Reactions   Erythromycin Other (See Comments)    Does not remember reaction.   Hydralazine Other (See Comments)    Not able to tolerate per patient; caused her to feel dizzy    Review of Systems: Negative except as noted in the HPI.  Objective: No changes noted in today's physical examination. Objective:   Vascular Examination: Vascular status intact b/l with palpable pedal pulses. CFT immediate b/l. No pain with calf compression b/l. Skin temperature gradient WNL b/l. Pedal hair sparse. Wearing thigh high compression hose. Trace edema noted BLE. No cyanosis or clubbing noted b/l LE.  Neurological Examination: Sensation grossly intact b/l with 10 gram monofilament. Vibratory sensation intact b/l. Pt has subjective symptoms of neuropathy.  Dermatological Examination: Pedal integument with normal turgor, texture and tone BLE. No open wounds b/l LE. No interdigital macerations noted b/l LE. Toenails 1-5 b/l well maintained with adequate length. No erythema, no edema, no drainage, no fluctuance. No hyperkeratotic nor porokeratotic lesions present on today's visit.  Musculoskeletal Examination: Muscle strength 5/5 to all lower extremity muscle groups bilaterally. No pain, crepitus or joint limitation noted with ROM bilateral LE. No gross bony deformities  bilaterally. Hammertoe deformity noted 2-5 b/l. Utilizes cane for ambulation assistance.  Radiographs: None  Last A1c:      Latest Ref Rng & Units 09/26/2021   10:26 AM 03/28/2021    3:07 PM  Hemoglobin A1C  Hemoglobin-A1c 4.6 - 6.5 % 7.3  7.4    Assessment/Plan: 1. Pain due to onychomycosis of nail   2. Diabetic peripheral neuropathy associated with type 2 diabetes mellitus (HCC)   -Examined patient. -No new findings. No new orders. -Mycotic toenails 1-5 bilaterally were debrided in length and girth with sterile nail nippers and dremel without incident. -Patient/POA to call should there be question/concern in the interim.   Return in about 3 months (around 03/10/2022).  Freddie Breech, DPM

## 2021-12-18 ENCOUNTER — Ambulatory Visit (INDEPENDENT_AMBULATORY_CARE_PROVIDER_SITE_OTHER): Payer: Medicare Other

## 2021-12-18 VITALS — Ht 65.0 in | Wt 199.0 lb

## 2021-12-18 DIAGNOSIS — Z0000 Encounter for general adult medical examination without abnormal findings: Secondary | ICD-10-CM

## 2021-12-18 NOTE — Progress Notes (Signed)
Subjective:   Mariah Mason is a 71 y.o. female who presents for Medicare Annual (Subsequent) preventive examination.  I connected with Mariah Mason today by telephone and verified that I am speaking with the correct person using two identifiers. Location patient: home Location provider: work Persons participating in the virtual visit: patient, Marine scientist.    I discussed the limitations, risks, security and privacy concerns of performing an evaluation and management service by telephone and the availability of in person appointments. I also discussed with the patient that there may be a patient responsible charge related to this service. The patient expressed understanding and verbally consented to this telephonic visit.    Interactive audio and video telecommunications were attempted between this provider and patient, however failed, due to patient having technical difficulties OR patient did not have access to video capability.  We continued and completed visit with audio only.  Some vital signs may be absent or patient reported.   Time Spent with patient on telephone encounter: 25 minutes   Review of Systems     Cardiac Risk Factors include: advanced age (>40mn, >>21women);diabetes mellitus;dyslipidemia;hypertension;obesity (BMI >30kg/m2)     Objective:    Today's Vitals   12/18/21 1126  Weight: 199 lb (90.3 kg)  Height: _0  (1.651 m)   Body mass index is 33.12 kg/m.     12/18/2021   11:29 AM 10/10/2021    1:03 AM 10/02/2021    4:47 PM 08/30/2020    9:38 AM 08/23/2020    1:08 PM 08/16/2020    1:47 PM 08/08/2020   12:39 PM  Advanced Directives  Does Patient Have a Medical Advance Directive? _1  No No  Does patient want to make changes to medical advance directive?     No - Patient declined    Would patient like information on creating a medical advance directive?   No - Patient declined No - Patient declined  No - Patient declined No - Patient declined    Current  Medications (verified) Outpatient Encounter Medications as of 12/18/2021  Medication Sig   ACCU-CHEK GUIDE test strip TEST BLOOD SUGAR ONCE DAILY   Accu-Chek Softclix Lancets lancets 4 (four) times daily.   allopurinol (ZYLOPRIM) 100 MG tablet TAKE 1 TABLET(100 MG) BY MOUTH DAILY   amLODipine (NORVASC) 10 MG tablet TAKE 1 TABLET(10 MG) BY MOUTH DAILY   atorvastatin (LIPITOR) 20 MG tablet Take 1 tablet (20 mg total) by mouth daily.   blood glucose meter kit and supplies KIT Dispense based on patient and insurance preference. Use up to four times daily as directed. (FOR ICD-9 250.00, 250.01).   carvedilol (COREG) 25 MG tablet TAKE 1 TABLET(25 MG) BY MOUTH TWICE DAILY WITH A MEAL   cholecalciferol (VITAMIN D3) 25 MCG (1000 UT) tablet Take 1,000 Units by mouth daily.   cloNIDine (CATAPRES) 0.1 MG tablet Take 1 tablet (0.1 mg total) by mouth at bedtime.   colchicine 0.6 MG tablet Take 2 tablets (1.23m by mouth at onset for gout, may repeat 1 tablet 0.19m54mn 2 hours is symptoms persist   cycloSPORINE (RESTASIS) 0.05 % ophthalmic emulsion Place 1 drop into both eyes 2 (two) times daily.    dapagliflozin propanediol (FARXIGA) 5 MG TABS tablet Take 1 tablet (5 mg total) by mouth daily before breakfast.   Elastic Bandages & Supports (MEDICAL COMPRESSION SOCKS) MISC Compression socks for DM peripheral neuropathy and bilateral leg swelling   furosemide (LASIX) 20 MG tablet Take 20 mg by mouth daily.  Misc. Devices [place] chair. Dx- Diabetes mellitus   Multiple Vitamins-Minerals (CENTRUM SILVER ADULT 50+ PO) Take 1 tablet by mouth daily.   NONFORMULARY OR COMPOUNDED ITEM Peripheral Neuropathy Cream: Bupivacaine 1%, Doxepin 3%, Gabapentin 6%, Pentoxifylline 3%, Topiramate 1% Order faxed to [facility]   sitaGLIPtin (JANUVIA) 50 MG tablet Take 0.5 tablets (25 mg total) by mouth daily.   valsartan (DIOVAN) 80 MG tablet Take 80 mg by mouth daily.   No facility-administered encounter medications  on file as of 12/18/2021.    Allergies (verified) Erythromycin and Hydralazine   History: Past Medical History:  Diagnosis Date   Diabetes mellitus without complication (HCC)    Gout    Hyperlipemia    Hypertension    Neuropathy    Past Surgical History:  Procedure Laterality Date   CYST EXCISION N/A 08/30/2020   Procedure: EXCISION 1X2 cm LEFT NECK SEBACEOUS CYST EXCISION 2X2 CM RIGHT BACK SEBACEOUS CYST;  Surgeon: Rolm Bookbinder, MD;  Location: [facility];  Service: General;  Laterality: N/A;   THYROID SURGERY     Family History  Problem Relation Age of Onset   Heart attack Mother    Kidney failure Father    Social History   Socioeconomic History   Marital status: Single    Spouse name: Not on file   Number of children: 6   Years of education: 9th grade   Highest education level: Not on file  Occupational History   Occupation: Retired  Tobacco Use   Smoking status: Never   Smokeless tobacco: Never  Vaping Use   Vaping Use: Never used  Substance and Sexual Activity   Alcohol use: Never   Drug use: Never   Sexual activity: Not on file  Other Topics Concern   Not on file  Social History Narrative   Lives with family.   Right-handed.   No daily use of caffeine.   Social Determinants of Health   Financial Resource Strain: Low Risk  (12/18/2021)   Overall Financial Resource Strain (CARDIA)    Difficulty of Paying Living Expenses: Not hard at all  Food Insecurity: No Food Insecurity (12/18/2021)   Hunger Vital Sign    Worried About Running Out of Food in the Last Year: Never true    Ran Out of Food in the Last Year: Never true  Transportation Needs: No Transportation Needs (12/18/2021)   PRAPARE - Hydrologist (Medical): No    Lack of Transportation (Non-Medical): No  Physical Activity: Sufficiently Active (12/18/2021)   Exercise Vital Sign    Days of Exercise per Week: 6 days    Minutes of Exercise per Session: 30  min  Stress: No Stress Concern Present (12/18/2021)   [place]    Feeling of Stress : Not at all  Social Connections: Not on file    Tobacco Counseling Counseling given: Not Answered   Clinical Intake:  Pre-visit preparation completed: Yes  Pain : No/denies pain     BMI - recorded: 33.12 Nutritional Status: BMI > 30  Obese Nutritional Risks: None Diabetes: Yes CBG done?: No Did pt. bring in CBG monitor from home?: No (phone visit)  How often do you need to have someone help you when you read instructions, pamphlets, or other written materials from your doctor or pharmacy?: 1 - Never  Diabetes:  Is the patient diabetic?  Yes  If diabetic, was a CBG obtained today?  No  Did the patient bring in their glucometer from home?  No phone visit How often do you monitor your CBG's? occassionally.   Financial Strains and Diabetes Management:  Are you having any financial strains with the device, your supplies or your medication? No .  Does the patient want to be seen by Chronic Care Management for management of their diabetes?  No  Would the patient like to be referred to a Nutritionist or for Diabetic Management?  No   Diabetic Exams:  Diabetic Eye Exam: Completed 11/23/2021.   Diabetic Foot Exam: Pt has been advised about the importance in completing this exam. To be completed by PCP.   Interpreter Needed?: No  Information entered by :: Caroleen Hamman LPN   Activities of Daily Living    12/18/2021   11:33 AM  In your present state of health, do you have any difficulty performing the following activities:  Hearing? 0  Vision? 0  Difficulty concentrating or making decisions? 0  Walking or climbing stairs? 1  Comment stairs  Dressing or bathing? 0  Doing errands, shopping? 1  Preparing Food and eating ? N  Using the Toilet? N  In the past six months, have you accidently leaked urine? N  Do you  have problems with loss of bowel control? N  Managing your Medications? N  Managing your Finances? N  Housekeeping or managing your Housekeeping? N    Patient Care Team: [name], [place] as PCP - General (Internal Medicine) [name], DPM as Consulting Physician (Podiatry)  Indicate any recent Medical Services you may have received from other than [facility] providers in the past year (date may be approximate).     Assessment:   This is a routine wellness examination for Mariah Mason.  Hearing/Vision screen Hearing Screening - Comments:: No issues Vision Screening - Comments:: Last eye exam-10/2021-Dr Forsey  Dietary issues and exercise activities discussed: Current Exercise Habits: Home exercise routine, Type of exercise: stretching;strength training/weights, Time (Minutes): 30, Frequency (Times/Week): 6, Weekly Exercise (Minutes/Week): 180, Intensity: Mild   Goals Addressed             This Visit's Progress    Patient Stated       Maintain current health       Depression Screen    12/18/2021   11:33 AM 11/09/2021    9:09 AM 03/28/2021    2:28 PM 12/15/2020    4:27 PM 11/23/2020    3:54 PM 08/25/2020   10:55 AM 02/11/2020   11:20 AM  PHQ 2/9 Scores  PHQ - 2 Score 0 0 0 0 0 0 0  PHQ- 9 Score    0 0  0    Fall Risk    11/09/2021    9:09 AM 03/28/2021    2:28 PM 12/15/2020    4:26 PM 11/11/2019   11:51 AM 08/12/2019    9:58 AM  [place] in the past year? 0 0 0 0 0  Number falls in past yr: 0 0 0    Injury with Fall? 0 0 0    Risk for fall due to :  No Fall Risks  No Fall Risks   Follow up  Falls evaluation completed       FALL RISK PREVENTION PERTAINING TO THE HOME:  Any stairs in or around the home? Yes  If so, are there any without handrails? No  Home free of loose throw rugs in walkways, pet beds, electrical cords, etc? Yes  Adequate lighting in your home to reduce risk of falls? Yes   ASSISTIVE DEVICES UTILIZED TO PREVENT  FALLS:  Life alert? No  Use of a cane, walker or w/c? Yes cane Grab bars in the bathroom? Yes  Shower chair or bench in shower? Yes  Elevated toilet seat or a handicapped toilet? Yes   TIMED UP AND GO:  Was the test performed? No . Phone visit   Cognitive Function:        12/18/2021   11:39 AM 12/15/2020    4:32 PM  6CIT Screen  What Year? 0 points 0 points  What month? 0 points 0 points  What time? 0 points 3 points  Count back from 20 0 points 0 points  Months in reverse 4 points 4 points  Repeat phrase 8 points 2 points  Total Score 12 points 9 points    Immunizations Immunization History  Administered Date(s) Administered   PNEUMOCOCCAL CONJUGATE-20 03/28/2021   Pneumococcal Conjugate-13 08/12/2019    TDAP status: Due, Education has been provided regarding the importance of this vaccine. Advised may receive this vaccine at local pharmacy or Health Dept. Aware to provide a copy of the vaccination record if obtained from local pharmacy or Health Dept. Verbalized acceptance and understanding.  Flu Vaccine status: Up to date  Pneumococcal vaccine status: Up to date  Covid-19 vaccine status: Information provided on how to obtain vaccines.   Qualifies for Shingles Vaccine? Yes   Zostavax completed No   Shingrix Completed?: No.    Education has been provided regarding the importance of this vaccine. Patient has been advised to call insurance company to determine out of pocket expense if they have not yet received this vaccine. Advised may also receive vaccine at local pharmacy or Health Dept. Verbalized acceptance and understanding.  Screening Tests Health Maintenance  Topic Date Due   Hepatitis C Screening  Never done   Zoster Vaccines- Shingrix (1 of 2) Never done   INFLUENZA VACCINE  11/28/2021   FOOT EXAM  03/12/2022 (Originally 10/26/2021)   TETANUS/TDAP  03/28/2022 (Originally 02/11/1970)   HEMOGLOBIN A1C  03/29/2022   Fecal DNA (Cologuard)  08/24/2022    OPHTHALMOLOGY EXAM  11/24/2022   MAMMOGRAM  01/13/2023   Pneumonia Vaccine 31+ Years old  Completed   DEXA SCAN  Completed   HPV VACCINES  Aged Out   COVID-19 Vaccine  Discontinued    Health Maintenance  Health Maintenance Due  Topic Date Due   Hepatitis C Screening  Never done   Zoster Vaccines- Shingrix (1 of 2) Never done   INFLUENZA VACCINE  11/28/2021    Colorectal cancer screening: Type of screening: Cologuard. Completed 08/24/2019. Repeat every 3 years  Mammogram status: Completed bilateral 01/12/2021. Repeat every year  Bone Density status: Due-Patient will call when she is ready to schedule.  Lung Cancer Screening: (Low Dose CT Chest recommended if Age 32-80 years, 30 pack-year currently smoking OR have quit w/in 15years.) does not qualify.     Additional Screening:  Hepatitis C Screening: does qualify; Patient to discuss with PCP at next office visit  Vision Screening: Recommended annual ophthalmology exams for early detection of glaucoma and other disorders of the eye. Is the patient up to date with their annual eye exam?  Yes  Who is the provider or what is the name of the office in which the patient attends annual eye exams? Dr. Trinna Post   Dental Screening: Recommended annual dental exams for proper oral hygiene  Community Resource  Referral / Chronic Care Management: CRR required this visit?  No   CCM required this visit?  No      Plan:     I have personally reviewed and noted the following in the patient's chart:   Medical and social history Use of alcohol, tobacco or illicit drugs  Current medications and supplements including opioid prescriptions. Patient is not currently taking opioid prescriptions. Functional ability and status Nutritional status Physical activity Advanced directives List of other physicians Hospitalizations, surgeries, and ER visits in previous 12 months Vitals Screenings to include cognitive, depression, and falls Referrals  and appointments  In addition, I have reviewed and discussed with patient certain preventive protocols, quality metrics, and best practice recommendations. A written personalized care plan for preventive services as well as general preventive health recommendations were provided to patient.   Due to this being a telephonic visit, the after visit summary with patients personalized plan was offered to patient via mail or my-chart. Patient would like to access on my-chart.   Marta Antu, LPN   11/25/209  Nurse Health Advisor  Nurse Notes: None

## 2021-12-18 NOTE — Patient Instructions (Addendum)
Ms. Moler , Thank you for taking time to complete your Medicare Wellness Visit. I appreciate your ongoing commitment to your health goals. Please review the following plan we discussed and let me know if I can assist you in the future.   Screening recommendations/referrals: Colonoscopy: Completed Cologuard 08/24/2019-Due 08/24/2022 Mammogram: Completed 01/12/2021-Due 01/12/2022-Per our conversation you will call to schedule Bone Density: Due-Per ur conversation, you will let us know if you need [place] to send an order. Recommended yearly ophthalmology/optometry visit for glaucoma screening and checkup Recommended yearly dental visit for hygiene and checkup  Vaccinations: Influenza vaccine: Due 12/2021 Pneumococcal vaccine: up to date Tdap vaccine: Due-May obtain vaccine at your local pharmacy. Shingles vaccine:   Due-May obtain vaccine at your local pharmacy. Covid-19:May obtain vaccine at your local pharmacy.  Advanced directives: May pick up information at your next office visit  Conditions/risks identified: See problem list  Next appointment: Follow up in one year for your annual wellness visit    Preventive Care 65 Years and Older, Female Preventive care refers to lifestyle choices and visits with your health care provider that can promote health and wellness. What does preventive care include? A yearly physical exam. This is also called an annual well check. Dental exams once or twice a year. Routine eye exams. Ask your health care provider how often you should have your eyes checked. Personal lifestyle choices, including: Daily care of your teeth and gums. Regular physical activity. Eating a healthy diet. Avoiding tobacco and drug use. Limiting alcohol use. Practicing safe sex. Taking low-dose aspirin every day. Taking vitamin and mineral supplements as recommended by your health care provider. What happens during an annual well check? The services and screenings done by your  health care provider during your annual well check will depend on your age, overall health, lifestyle risk factors, and family history of disease. Counseling  Your health care provider may ask you questions about your: Alcohol use. Tobacco use. Drug use. Emotional well-being. Home and relationship well-being. Sexual activity. Eating habits. History of falls. Memory and ability to understand (cognition). Work and work Astronomer. Reproductive health. Screening  You may have the following tests or measurements: Height, weight, and BMI. Blood pressure. Lipid and cholesterol levels. These may be checked every 5 years, or more frequently if you are over 49 years old. Skin check. Lung cancer screening. You may have this screening every year starting at age 22 if you have a 30-pack-year history of smoking and currently smoke or have quit within the past 15 years. Fecal occult blood test (FOBT) of the stool. You may have this test every year starting at age 91. Flexible sigmoidoscopy or colonoscopy. You may have a sigmoidoscopy every 5 years or a colonoscopy every 10 years starting at age 4. Hepatitis C blood test. Hepatitis B blood test. Sexually transmitted disease (STD) testing. Diabetes screening. This is done by checking your blood sugar (glucose) after you have not eaten for a while (fasting). You may have this done every 1-3 years. Bone density scan. This is done to screen for osteoporosis. You may have this done starting at age 29. Mammogram. This may be done every 1-2 years. Talk to your health care provider about how often you should have regular mammograms. Talk with your health care provider about your test results, treatment options, and if necessary, the need for more tests. Vaccines  Your health care provider may recommend certain vaccines, such as: Influenza vaccine. This is recommended every year. Tetanus, diphtheria, and acellular  pertussis (Tdap, Td) vaccine. You may  need a Td booster every 10 years. Zoster vaccine. You may need this after age 55. Pneumococcal 13-valent conjugate (PCV13) vaccine. One dose is recommended after age 75. Pneumococcal polysaccharide (PPSV23) vaccine. One dose is recommended after age 17. Talk to your health care provider about which screenings and vaccines you need and how often you need them. This information is not intended to replace advice given to you by your health care provider. Make sure you discuss any questions you have with your health care provider. Document Released: 05/13/2015 Document Revised: 01/04/2016 Document Reviewed: 02/15/2015 Elsevier Interactive Patient Education  2017 [place] Prevention in the Home Falls can cause injuries. They can happen to people of all ages. There are many things you can do to make your home safe and to help prevent falls. What can I do on the outside of my home? Regularly fix the edges of walkways and driveways and fix any cracks. Remove anything that might make you trip as you walk through a door, such as a raised step or threshold. Trim any bushes or trees on the path to your home. Use bright outdoor lighting. Clear any walking paths of anything that might make someone trip, such as rocks or tools. Regularly check to see if handrails are loose or broken. Make sure that both sides of any steps have handrails. Any raised decks and porches should have guardrails on the edges. Have any leaves, snow, or ice cleared regularly. Use sand or salt on walking paths during winter. Clean up any spills in your garage right away. This includes oil or grease spills. What can I do in the bathroom? Use night lights. Install grab bars by the toilet and in the tub and shower. Do not use towel bars as grab bars. Use non-skid mats or decals in the tub or shower. If you need to sit down in the shower, use a plastic, non-slip stool. Keep the floor dry. Clean up any water that spills on  the floor as soon as it happens. Remove soap buildup in the tub or shower regularly. Attach bath mats securely with double-sided non-slip rug tape. Do not have throw rugs and other things on the floor that can make you trip. What can I do in the bedroom? Use night lights. Make sure that you have a light by your bed that is easy to reach. Do not use any sheets or blankets that are too big for your bed. They should not hang down onto the floor. Have a firm chair that has side arms. You can use this for support while you get dressed. Do not have throw rugs and other things on the floor that can make you trip. What can I do in the kitchen? Clean up any spills right away. Avoid walking on wet floors. Keep items that you use a lot in easy-to-reach places. If you need to reach something above you, use a strong step stool that has a grab bar. Keep electrical cords out of the way. Do not use floor polish or wax that makes floors slippery. If you must use wax, use non-skid floor wax. Do not have throw rugs and other things on the floor that can make you trip. What can I do with my stairs? Do not leave any items on the stairs. Make sure that there are handrails on both sides of the stairs and use them. Fix handrails that are broken or loose. Make sure that handrails  are as long as the stairways. Check any carpeting to make sure that it is firmly attached to the stairs. Fix any carpet that is loose or worn. Avoid having throw rugs at the top or bottom of the stairs. If you do have throw rugs, attach them to the floor with carpet tape. Make sure that you have a light switch at the top of the stairs and the bottom of the stairs. If you do not have them, ask someone to add them for you. What else can I do to help prevent falls? Wear shoes that: Do not have high heels. Have rubber bottoms. Are comfortable and fit you well. Are closed at the toe. Do not wear sandals. If you use a stepladder: Make sure  that it is fully opened. Do not climb a closed stepladder. Make sure that both sides of the stepladder are locked into place. Ask someone to hold it for you, if possible. Clearly mark and make sure that you can see: Any grab bars or handrails. First and last steps. Where the edge of each step is. Use tools that help you move around (mobility aids) if they are needed. These include: Canes. Walkers. Scooters. Crutches. Turn on the lights when you go into a dark area. Replace any light bulbs as soon as they burn out. Set up your furniture so you have a clear path. Avoid moving your furniture around. If any of your floors are uneven, fix them. If there are any pets around you, be aware of where they are. Review your medicines with your doctor. Some medicines can make you feel dizzy. This can increase your chance of falling. Ask your doctor what other things that you can do to help prevent falls. This information is not intended to replace advice given to you by your health care provider. Make sure you discuss any questions you have with your health care provider. Document Released: 02/10/2009 Document Revised: 09/22/2015 Document Reviewed: 05/21/2014 Elsevier Interactive Patient Education  2017 [employer].

## 2021-12-22 ENCOUNTER — Telehealth: Payer: Self-pay | Admitting: Family

## 2021-12-22 ENCOUNTER — Other Ambulatory Visit: Payer: Self-pay | Admitting: Family

## 2021-12-22 DIAGNOSIS — M8589 Other specified disorders of bone density and structure, multiple sites: Secondary | ICD-10-CM

## 2021-12-22 DIAGNOSIS — M8580 Other specified disorders of bone density and structure, unspecified site: Secondary | ICD-10-CM

## 2021-12-22 NOTE — Telephone Encounter (Signed)
Mariah Mason (daughter) called staitng pt wanted to have a bone density test done at the same time as her mammogram. Pt is having the MM done at the following facility on 9.18.23 @ 11:45a:  [facility] [address]. [address] [place], [place] [address] P: [phone]

## 2021-12-25 NOTE — Telephone Encounter (Signed)
I have called the pt and spoke to her daughter. I have relayed the message from the provider and she stated understanding.

## 2021-12-27 NOTE — Telephone Encounter (Signed)
Luna Kitchens (daughter) called stating that the imaging center still has not received those orders regarding the bone density test. Office gave her a new number to fax those orders over to to ensure they get them:  [phone]

## 2021-12-28 NOTE — Telephone Encounter (Signed)
I have called the pt and informed her that the order for the dexa scan has been sent on the 12/22/21, however I have also re-faxed the paper order to [phone] per pt daughter request.

## 2021-12-29 ENCOUNTER — Other Ambulatory Visit: Payer: Self-pay | Admitting: Family

## 2022-01-12 ENCOUNTER — Other Ambulatory Visit: Payer: Self-pay

## 2022-01-12 ENCOUNTER — Ambulatory Visit (INDEPENDENT_AMBULATORY_CARE_PROVIDER_SITE_OTHER): Payer: Medicare Other | Admitting: Family

## 2022-01-12 ENCOUNTER — Encounter: Payer: Self-pay | Admitting: Family

## 2022-01-12 VITALS — BP 138/60 | HR 70 | Temp 98.7°F | Ht 64.0 in | Wt 196.6 lb

## 2022-01-12 DIAGNOSIS — E1122 Type 2 diabetes mellitus with diabetic chronic kidney disease: Secondary | ICD-10-CM | POA: Diagnosis not present

## 2022-01-12 DIAGNOSIS — N189 Chronic kidney disease, unspecified: Secondary | ICD-10-CM

## 2022-01-12 DIAGNOSIS — Z23 Encounter for immunization: Secondary | ICD-10-CM

## 2022-01-12 DIAGNOSIS — I10 Essential (primary) hypertension: Secondary | ICD-10-CM

## 2022-01-12 LAB — COMPREHENSIVE METABOLIC PANEL
ALT: 10 U/L (ref 0–35)
AST: 11 U/L (ref 0–37)
Albumin: 4.1 g/dL (ref 3.5–5.2)
Alkaline Phosphatase: 58 U/L (ref 39–117)
BUN: 52 mg/dL — ABNORMAL HIGH (ref 6–23)
CO2: 23 mEq/L (ref 19–32)
Calcium: 9.7 mg/dL (ref 8.4–10.5)
Chloride: 108 mEq/L (ref 96–112)
Creatinine, Ser: 1.93 mg/dL — ABNORMAL HIGH (ref 0.40–1.20)
GFR: 25.68 mL/min — ABNORMAL LOW (ref >60.00)
Glucose, Bld: 142 mg/dL — ABNORMAL HIGH (ref 70–99)
Potassium: 4.7 mEq/L (ref 3.5–5.1)
Sodium: 140 mEq/L (ref 135–145)
Total Bilirubin: 0.4 mg/dL (ref 0.2–1.2)
Total Protein: 7.5 g/dL (ref 6.0–8.3)

## 2022-01-12 LAB — HEMOGLOBIN A1C: Hgb A1c MFr Bld: 6.9 % — ABNORMAL HIGH (ref 4.6–6.5)

## 2022-01-12 MED ORDER — VALSARTAN 40 MG PO TABS: 40.0000 mg | ORAL_TABLET | Freq: Every day | ORAL | 1 refills | Status: DC

## 2022-01-12 NOTE — Progress Notes (Signed)
Mariah Mason is a 71 y.o. female with the following history as recorded in EpicCare:  Patient Active Problem List   Diagnosis Date Noted   Bilateral lumbar radiculopathy 08/22/2020   DM type 2 with diabetic peripheral neuropathy ([place]) 08/02/2020   Gait abnormality 08/02/2020   Gout    Hypertension    Type 2 diabetes mellitus ([place]) 02/18/2019   AKI (acute kidney injury) ([place]) 02/18/2019   Hyperkalemia 60/45/4098   Metabolic acidosis 11/91/4782   Hypoglycemia 02/17/2019   Long term current use of oral hypoglycemic drug 05/23/2017   Presbyopia of both eyes 05/23/2017   Vitreous floaters of both eyes 05/23/2017   Keratoconjunctivitis sicca of both eyes not specified as Sjogren's 10/16/2016   Nuclear sclerotic cataract of both eyes 10/16/2016    Current Outpatient Medications  Medication Sig Dispense Refill   ACCU-CHEK GUIDE test strip TEST BLOOD SUGAR ONCE DAILY 100 each 3   Accu-Chek Softclix Lancets lancets 4 (four) times daily.     allopurinol (ZYLOPRIM) 100 MG tablet TAKE 1 TABLET(100 MG) BY MOUTH DAILY 90 tablet 3   amLODipine (NORVASC) 10 MG tablet TAKE 1 TABLET(10 MG) BY MOUTH DAILY 90 tablet 3   atorvastatin (LIPITOR) 20 MG tablet Take 1 tablet (20 mg total) by mouth daily. 90 tablet 3   blood glucose meter kit and supplies KIT Dispense based on patient and insurance preference. Use up to four times daily as directed. (FOR ICD-9 250.00, 250.01). 1 each 0   carvedilol (COREG) 25 MG tablet TAKE 1 TABLET(25 MG) BY MOUTH TWICE DAILY WITH A MEAL 180 tablet 3   cholecalciferol (VITAMIN D3) 25 MCG (1000 UT) tablet Take 1,000 Units by mouth daily.     cloNIDine (CATAPRES) 0.1 MG tablet TAKE 1 TABLET(0.1 MG) BY MOUTH EVERY EVENING 90 tablet 1   colchicine 0.6 MG tablet Take 2 tablets (1.82m) by mouth at onset for gout, may repeat 1 tablet 0.622min 2 hours is symptoms persist 30 tablet 1   cycloSPORINE (RESTASIS) 0.05 % ophthalmic emulsion Place 1 drop into both eyes 2 (two) times daily.       Elastic Bandages & Supports (MEDICAL COMPRESSION SOCKS) MISC Compression socks for DM peripheral neuropathy and bilateral leg swelling 2 each 3   FARXIGA 5 MG TABS tablet TAKE 1 TABLET(5 MG) BY MOUTH DAILY BEFORE BREAKFAST 30 tablet 1   Misc. Devices MICrawfordhower chair. Dx- Diabetes mellitus 1 each 0   Multiple Vitamins-Minerals (CENTRUM SILVER ADULT 50+ PO) Take 1 tablet by mouth daily.     sitaGLIPtin (JANUVIA) 50 MG tablet Take 0.5 tablets (25 mg total) by mouth daily. 45 tablet 0   NONFORMULARY OR COMPOUNDED ITEM Peripheral Neuropathy Cream: Bupivacaine 1%, Doxepin 3%, Gabapentin 6%, Pentoxifylline 3%, Topiramate 1% [place]: Reported on 01/12/2022) 1 each 3   valsartan (DIOVAN) 40 MG tablet Take 1 tablet (40 mg total) by mouth daily. 90 tablet 1   No current facility-administered medications for this visit.    Allergies: Erythromycin and Hydralazine  Past Medical History:  Diagnosis Date   Diabetes mellitus without complication ([place]   Gout    Hyperlipemia    Hypertension    Neuropathy     Past Surgical History:  Procedure Laterality Date   CYST EXCISION N/A 08/30/2020   Procedure: EXCISION 1X2 cm LEFT NECK SEBACEOUS CYST EXCISION 2X2 CM RIGHT BACK SEBACEOUS CYST;  Surgeon: WaRolm BookbinderMD;  [place];  Laterality:  N/A;   THYROID SURGERY      Family History  Problem Relation Age of Onset   Heart attack Mother    Kidney failure Father     Social History   Tobacco Use   Smoking status: Never   Smokeless tobacco: Never  Substance Use Topics   Alcohol use: Never    Subjective:  Accompanied by daughter; Follow up on Type 2 Diabetes, CKD and HTN; since last OV here, has seen nephrology; notes she was very pleased with that visit and is "feeling really good." Scheduled to go back to nephrology in November- planning q 3 months visits for now;  Denies any chest pain, shortness of  breath, blurred vision or headache.    Objective:  Vitals:   01/12/22 0905  BP: 138/60  Pulse: 70  Temp: 98.7 F (37.1 C)  TempSrc: Oral  SpO2: 98%  Weight: 196 lb 9.6 oz (89.2 kg)  Height: 5' 4"  (1.626 m)    General: Well developed, well nourished, in no acute distress  Skin : Warm and dry.  Head: Normocephalic and atraumatic  Lungs: Respirations unlabored; clear to auscultation bilaterally without wheeze, rales, rhonchi  CVS exam: normal rate and regular rhythm.  Neurologic: Alert and oriented; speech intact; face symmetrical; moves all extremities well; CNII-XII intact without focal deficit   Assessment:  1. Type 2 diabetes mellitus with chronic kidney disease, without long-term current use of insulin, unspecified CKD stage ([place])   2. Need for immunization against influenza   3. Primary hypertension   4. Chronic kidney disease, unspecified CKD stage     Plan:  Responding well to medication changes; will continue working with nephrology as scheduled; update labs today; high dose flu shot updated; follow up in 6 months, sooner prn.   No follow-ups on file.  Orders Placed This Encounter  Procedures   Flu Vaccine QUAD High Dose(Fluad)   Comp Met (CMET)   Hemoglobin A1c    Requested Prescriptions   Signed Prescriptions Disp Refills   valsartan (DIOVAN) 40 MG tablet 90 tablet 1    Sig: Take 1 tablet (40 mg total) by mouth daily.

## 2022-01-15 LAB — HM MAMMOGRAPHY

## 2022-01-16 ENCOUNTER — Encounter: Payer: Self-pay | Admitting: Family

## 2022-01-22 ENCOUNTER — Encounter: Payer: Self-pay | Admitting: Family

## 2022-01-29 LAB — HM DEXA SCAN: HM Dexa Scan: NORMAL

## 2022-01-30 ENCOUNTER — Telehealth: Payer: Self-pay | Admitting: Family

## 2022-01-30 NOTE — Telephone Encounter (Signed)
I have called daughter back and relayed the results and she stated understanding.

## 2022-01-30 NOTE — Telephone Encounter (Signed)
Pt's daughter called back about results, she stated she has not been advised.

## 2022-01-30 NOTE — Telephone Encounter (Signed)
Her bone density is normal; will plan to re-check in 2-3 years;  Stay on daily Vitamin D and Centrum Silver.

## 2022-01-30 NOTE — Telephone Encounter (Signed)
I called the pt and gave her the resultss. She stated understanding.

## 2022-02-12 ENCOUNTER — Telehealth: Payer: Self-pay | Admitting: Family

## 2022-02-12 MED ORDER — ACCU-CHEK GUIDE VI STRP: ORAL_STRIP | 3 refills | Status: DC

## 2022-02-12 NOTE — Telephone Encounter (Signed)
Rx sent to pharmacy   

## 2022-02-12 NOTE — Telephone Encounter (Signed)
Medication: ACCU-CHEK GUIDE test strip   Has the patient contacted their pharmacy? Yes.    Preferred Pharmacy (with phone number or street name):  [id number] - [place], [address] [place], [place] [address] Phone: [phone]  Fax: [phone]

## 2022-02-16 ENCOUNTER — Telehealth: Payer: Self-pay | Admitting: Family

## 2022-02-16 DIAGNOSIS — E1122 Type 2 diabetes mellitus with diabetic chronic kidney disease: Secondary | ICD-10-CM

## 2022-02-16 NOTE — Telephone Encounter (Signed)
I have called the pharmacy to gather more information. They stated that a CMN form needed to be completed in order for them to fill the rx for the test strips. I stated understanding and confirmed they got the correct fax number. I have not seen the form yet but I will be awaiting it.

## 2022-02-16 NOTE — Telephone Encounter (Signed)
I have received the forms from [facility] and completed it. Placed in sign folder for provider sig and I can fax it back.

## 2022-02-16 NOTE — Telephone Encounter (Signed)
Tried to call the pt's daughter back and there was no answer and unable to leave message since the mail box was full.

## 2022-02-16 NOTE — Telephone Encounter (Signed)
Patient's daughter called to advise that the pharmacy faxed over some forms for PCP to complete and send back to them so they can get insurance to cover her test strips. Please call to advise when form sent back to pharmacy.

## 2022-02-20 MED ORDER — ACCU-CHEK GUIDE VI STRP: ORAL_STRIP | 3 refills | Status: DC

## 2022-02-20 NOTE — Telephone Encounter (Signed)
I have called the pharmacy and I have informed them that we have changed the script to match the form. They stated they have both and will call medicaid so it can be filled. If there is any additional concerns then they will call us back.

## 2022-02-20 NOTE — Addendum Note (Signed)
Addended by: Kittie Plater, Afton Lavalle HUA on: 02/20/2022 02:27 PM   Modules accepted: Orders

## 2022-02-20 NOTE — Telephone Encounter (Signed)
[facility] called to advise that the prescription for ACCU-CHEK GUIDE test strip says once daily but the CMN that was sent in says 2x/day. Prescription and CMN need to match. Please update prescription to reflect twice daily and [facility] will fax a new CMN to be signed by provider.

## 2022-02-22 ENCOUNTER — Other Ambulatory Visit: Payer: Self-pay | Admitting: Family

## 2022-03-05 ENCOUNTER — Other Ambulatory Visit: Payer: Self-pay | Admitting: Family

## 2022-03-05 DIAGNOSIS — I10 Essential (primary) hypertension: Secondary | ICD-10-CM

## 2022-03-05 LAB — PROTEIN / CREATININE RATIO, URINE
Creatinine, Urine: 59.2
PROTEIN,TOTAL,URINE: 28.8
Protein/Creat Ratio: 486

## 2022-03-06 ENCOUNTER — Other Ambulatory Visit: Payer: Self-pay | Admitting: Family

## 2022-03-06 DIAGNOSIS — E1149 Type 2 diabetes mellitus with other diabetic neurological complication: Secondary | ICD-10-CM

## 2022-03-12 ENCOUNTER — Encounter: Payer: Self-pay | Admitting: Family

## 2022-03-12 NOTE — Progress Notes (Signed)
Labs abstracted 

## 2022-03-28 ENCOUNTER — Ambulatory Visit (INDEPENDENT_AMBULATORY_CARE_PROVIDER_SITE_OTHER): Payer: Medicare Other | Admitting: Podiatry

## 2022-03-28 ENCOUNTER — Encounter: Payer: Self-pay | Admitting: Podiatry

## 2022-03-28 DIAGNOSIS — M79676 Pain in unspecified toe(s): Secondary | ICD-10-CM | POA: Diagnosis not present

## 2022-03-28 DIAGNOSIS — L853 Xerosis cutis: Secondary | ICD-10-CM

## 2022-03-28 DIAGNOSIS — M2011 Hallux valgus (acquired), right foot: Secondary | ICD-10-CM | POA: Diagnosis not present

## 2022-03-28 DIAGNOSIS — M2041 Other hammer toe(s) (acquired), right foot: Secondary | ICD-10-CM

## 2022-03-28 DIAGNOSIS — M2012 Hallux valgus (acquired), left foot: Secondary | ICD-10-CM

## 2022-03-28 DIAGNOSIS — B351 Tinea unguium: Secondary | ICD-10-CM

## 2022-03-28 DIAGNOSIS — M79609 Pain in unspecified limb: Secondary | ICD-10-CM | POA: Diagnosis not present

## 2022-03-28 DIAGNOSIS — M2042 Other hammer toe(s) (acquired), left foot: Secondary | ICD-10-CM

## 2022-03-28 DIAGNOSIS — E1142 Type 2 diabetes mellitus with diabetic polyneuropathy: Secondary | ICD-10-CM

## 2022-03-28 DIAGNOSIS — E119 Type 2 diabetes mellitus without complications: Secondary | ICD-10-CM

## 2022-03-28 MED ORDER — AMMONIUM LACTATE 12 % EX LOTN: 1.0000 | TOPICAL_LOTION | CUTANEOUS | 5 refills | Status: DC | PRN

## 2022-03-28 NOTE — Patient Instructions (Signed)
Apply Vick's Vapor Rub to toenails once daily with cotton tipped applicator.

## 2022-03-28 NOTE — Progress Notes (Signed)
ANNUAL DIABETIC FOOT EXAM  Subjective: Mariah Mason presents today for annual diabetic foot examination.  Chief Complaint  Patient presents with   Nail Problem    Diabetic foot care BS-120 A1C-6.9 PCP-Laura Murrary PCP VST-12/2021   Patient confirms h/o diabetes.  Patient denies any h/o foot wounds.  Risk factors: diabetes, neuropathy, HTN, hyperlipemia.  Mariah Salvage, FNP is patient's PCP. Last visit was January 29, 2022.  Past Medical History:  Diagnosis Date   Diabetes mellitus without complication ([place])    Gout    Hyperlipemia    Hypertension    Neuropathy    Patient Active Problem List   Diagnosis Date Noted   Bilateral lumbar radiculopathy 08/22/2020   DM type 2 with diabetic peripheral neuropathy ([place]) 08/02/2020   Gait abnormality 08/02/2020   Gout    Hypertension    Type 2 diabetes mellitus ([place]) 02/18/2019   AKI (acute kidney injury) ([place]) 02/18/2019   Hyperkalemia 67/61/9509   Metabolic acidosis 32/67/1245   Hypoglycemia 02/17/2019   Long term current use of oral hypoglycemic drug 05/23/2017   Presbyopia of both eyes 05/23/2017   Vitreous floaters of both eyes 05/23/2017   Keratoconjunctivitis sicca of both eyes not specified as Sjogren's 10/16/2016   Nuclear sclerotic cataract of both eyes 10/16/2016   Past Surgical History:  Procedure Laterality Date   CYST EXCISION N/A 08/30/2020   Procedure: EXCISION 1X2 cm LEFT NECK SEBACEOUS CYST EXCISION 2X2 CM RIGHT BACK SEBACEOUS CYST;  Surgeon: Rolm Bookbinder, MD;  Location: [facility];  Service: General;  Laterality: N/A;   THYROID SURGERY     Current Outpatient Medications on File Prior to Visit  Medication Sig Dispense Refill   ACCU-CHEK GUIDE test strip TEST BLOOD SUGAR ONCE DAILY TEST BLOOD SUGAR up to TWICE DAILY 100 each 3   Accu-Chek Softclix Lancets lancets 4 (four) times daily.     allopurinol (ZYLOPRIM) 100 MG tablet TAKE 1 TABLET(100 MG) BY MOUTH DAILY 90 tablet 3    amLODipine (NORVASC) 10 MG tablet TAKE 1 TABLET(10 MG) BY MOUTH DAILY 90 tablet 3   atorvastatin (LIPITOR) 20 MG tablet Take 1 tablet (20 mg total) by mouth daily. 90 tablet 3   blood glucose meter kit and supplies KIT Dispense based on patient and insurance preference. Use up to four times daily as directed. (FOR ICD-9 250.00, 250.01). 1 each 0   carvedilol (COREG) 25 MG tablet TAKE 1 TABLET(25 MG) BY MOUTH TWICE DAILY WITH A MEAL 180 tablet 3   cholecalciferol (VITAMIN D3) 25 MCG (1000 UT) tablet Take 1,000 Units by mouth daily.     cloNIDine (CATAPRES) 0.1 MG tablet TAKE 1 TABLET(0.1 MG) BY MOUTH EVERY EVENING 90 tablet 1   colchicine 0.6 MG tablet Take 2 tablets (1.27m) by mouth at onset for gout, may repeat 1 tablet 0.612min 2 hours is symptoms persist 30 tablet 1   cycloSPORINE (RESTASIS) 0.05 % ophthalmic emulsion Place 1 drop into both eyes 2 (two) times daily.      Elastic Bandages & Supports (MEDICAL COMPRESSION SOCKS) MISC Compression socks for DM peripheral neuropathy and bilateral leg swelling 2 each 3   FARXIGA 5 MG TABS tablet TAKE 1 TABLET(5 MG) BY MOUTH DAILY BEFORE BREAKFAST 30 tablet 4   Misc. Devices MITuskahomahower chair. Dx- Diabetes mellitus 1 each 0   Multiple Vitamins-Minerals (CENTRUM SILVER ADULT 50+ PO) Take 1 tablet by mouth daily.     NONFORMULARY OR COMPOUNDED ITEM Peripheral Neuropathy Cream: Bupivacaine 1%, Doxepin  3%, Gabapentin 6%, Pentoxifylline 3%, Topiramate 1% Order faxed to [place] (Patient not taking: Reported on 01/12/2022) 1 each 3   sitaGLIPtin (JANUVIA) 50 MG tablet TAKE 1 TABLET(50 MG) BY MOUTH DAILY 90 tablet 1   valsartan (DIOVAN) 40 MG tablet Take 1 tablet (40 mg total) by mouth daily. 90 tablet 1   No current facility-administered medications on file prior to visit.    Allergies  Allergen Reactions   Hydrochlorothiazide     Other Reaction(s): dizziness   Erythromycin Other (See Comments)    Does not remember reaction.   Hydralazine  Other (See Comments)    Not able to tolerate per patient; caused her to feel dizzy   Social History   Occupational History   Occupation: Retired  Tobacco Use   Smoking status: Never   Smokeless tobacco: Never  Vaping Use   Vaping Use: Never used  Substance and Sexual Activity   Alcohol use: Never   Drug use: Never   Sexual activity: Not on file   Family History  Problem Relation Age of Onset   Heart attack Mother    Kidney failure Father    Immunization History  Administered Date(s) Administered   Fluad Quad(high Dose 65+) 01/12/2022   PNEUMOCOCCAL CONJUGATE-20 03/28/2021   Pneumococcal Conjugate-13 08/12/2019    Review of Systems: Negative except as noted in the HPI.   Objective: There were no vitals filed for this visit.  Mariah Mason is a pleasant 71 y.o. female in NAD. AAO X 3.  Vascular Examination: CFT <3 seconds b/l. DP/PT pulses faintly palpable b/l. Skin temperature gradient warm to warm b/l. No pain with calf compression. No ischemia or gangrene. No cyanosis or clubbing noted b/l. Pedal hair absent.   Neurological Examination: Pt has subjective symptoms of neuropathy. Vibratory sensation intact b/l. Sensation grossly intact b/l with 10 gram monofilament. Vibratory sensation intact b/l.   Dermatological Examination: Pedal skin warm and supple b/l. Toenails 1-5 b/l thick, discolored, elongated with subungual debris and pain on dorsal palpation.  No open wounds b/l LE. No interdigital macerations noted b/l LE. Pedal skin noted to be dry b/l lower extremities.  Musculoskeletal Examination: Muscle strength 5/5 to b/l LE. HAV with bunion bilaterally and hammertoes 2-5 b/l. Utilizes cane for ambulation assistance.  Radiographs: None  Last A1c:      Latest Ref Rng & Units 01/12/2022    9:48 AM 09/26/2021   10:26 AM  Hemoglobin A1C  Hemoglobin-A1c 4.6 - 6.5 % 6.9  7.3    Footwear Assessment: Does the patient wear appropriate shoes? Yes. Does the patient need  inserts/orthotics? No.  ADA Risk Categorization: Low Risk :  Patient has all of the following: Intact protective sensation No prior foot ulcer  No severe deformity Pedal pulses present  Assessment: 1. Pain due to onychomycosis of nail   2. Xerosis cutis   3. Hallux valgus, acquired, bilateral   4. Acquired hammertoes of both feet   5. Diabetic peripheral neuropathy associated with type 2 diabetes mellitus ([place])   6. Encounter for diabetic foot exam ([place])     Plan: -Consent given for treatment as described below: -Diabetic foot examination performed today. -Continue foot and shoe inspections daily. Monitor blood glucose per PCP/Endocrinologist's recommendations. -Continue supportive shoe gear daily. -Discussed treatment options for onychomycosis. Patient opted for topical OTC therapy. Patient is to apply Vick's Vapor rub with cotton tipped applicator to affected toenail(s) once daily. -Toenails 1-5 b/l were debrided in length and girth with sterile nail  nippers and dremel without iatrogenic bleeding.  -Patient/POA to call should there be question/concern in the interim. Return in about 3 months (around 06/28/2022).  [name], DPM

## 2022-04-07 ENCOUNTER — Emergency Department (HOSPITAL_BASED_OUTPATIENT_CLINIC_OR_DEPARTMENT_OTHER): Payer: Medicare Other

## 2022-04-07 ENCOUNTER — Encounter (HOSPITAL_BASED_OUTPATIENT_CLINIC_OR_DEPARTMENT_OTHER): Payer: Self-pay | Admitting: Emergency Medicine

## 2022-04-07 ENCOUNTER — Other Ambulatory Visit: Payer: Self-pay

## 2022-04-07 ENCOUNTER — Emergency Department (HOSPITAL_BASED_OUTPATIENT_CLINIC_OR_DEPARTMENT_OTHER)
Admission: EM | Admit: 2022-04-07 | Discharge: 2022-04-08 | Disposition: A | Discharge status: Home / Self Care | Payer: Medicare Other | Attending: Emergency Medicine | Admitting: Emergency Medicine

## 2022-04-07 DIAGNOSIS — E119 Type 2 diabetes mellitus without complications: Secondary | ICD-10-CM | POA: Diagnosis not present

## 2022-04-07 DIAGNOSIS — M5410 Radiculopathy, site unspecified: Secondary | ICD-10-CM

## 2022-04-07 DIAGNOSIS — R29898 Other symptoms and signs involving the musculoskeletal system: Secondary | ICD-10-CM

## 2022-04-07 DIAGNOSIS — Z79899 Other long term (current) drug therapy: Secondary | ICD-10-CM | POA: Insufficient documentation

## 2022-04-07 DIAGNOSIS — R531 Weakness: Secondary | ICD-10-CM | POA: Insufficient documentation

## 2022-04-07 DIAGNOSIS — N390 Urinary tract infection, site not specified: Secondary | ICD-10-CM | POA: Diagnosis not present

## 2022-04-07 DIAGNOSIS — I10 Essential (primary) hypertension: Secondary | ICD-10-CM | POA: Insufficient documentation

## 2022-04-07 DIAGNOSIS — M4802 Spinal stenosis, cervical region: Secondary | ICD-10-CM | POA: Diagnosis not present

## 2022-04-07 DIAGNOSIS — R209 Unspecified disturbances of skin sensation: Secondary | ICD-10-CM | POA: Insufficient documentation

## 2022-04-07 LAB — COMPREHENSIVE METABOLIC PANEL
ALT: 12 U/L (ref 0–44)
AST: 12 U/L — ABNORMAL LOW (ref 15–41)
Albumin: 3.8 g/dL (ref 3.5–5.0)
Alkaline Phosphatase: 59 U/L (ref 38–126)
Anion gap: 7 (ref 5–15)
BUN: 57 mg/dL — ABNORMAL HIGH (ref 8–23)
CO2: 20 mmol/L — ABNORMAL LOW (ref 22–32)
Calcium: 9 mg/dL (ref 8.9–10.3)
Chloride: 109 mmol/L (ref 98–111)
Creatinine, Ser: 1.97 mg/dL — ABNORMAL HIGH (ref 0.44–1.00)
GFR, Estimated: 27 mL/min — ABNORMAL LOW (ref >60)
Glucose, Bld: 130 mg/dL — ABNORMAL HIGH (ref 70–99)
Potassium: 4.4 mmol/L (ref 3.5–5.1)
Sodium: 136 mmol/L (ref 135–145)
Total Bilirubin: 0.4 mg/dL (ref 0.3–1.2)
Total Protein: 7.9 g/dL (ref 6.5–8.1)

## 2022-04-07 LAB — CBC WITH DIFFERENTIAL/PLATELET
Abs Immature Granulocytes: 0.02 10*3/uL (ref 0.00–0.07)
Basophils Absolute: 0 10*3/uL (ref 0.0–0.1)
Basophils Relative: 0 %
Eosinophils Absolute: 0.1 10*3/uL (ref 0.0–0.5)
Eosinophils Relative: 2 %
HCT: 33.9 % — ABNORMAL LOW (ref 36.0–46.0)
Hemoglobin: 10.9 g/dL — ABNORMAL LOW (ref 12.0–15.0)
Immature Granulocytes: 0 %
Lymphocytes Relative: 30 %
Lymphs Abs: 1.9 10*3/uL (ref 0.7–4.0)
MCH: 27.1 pg (ref 26.0–34.0)
MCHC: 32.2 g/dL (ref 30.0–36.0)
MCV: 84.3 fL (ref 80.0–100.0)
Monocytes Absolute: 0.4 10*3/uL (ref 0.1–1.0)
Monocytes Relative: 6 %
Neutro Abs: 3.9 10*3/uL (ref 1.7–7.7)
Neutrophils Relative %: 62 %
Platelets: 244 10*3/uL (ref 150–400)
RBC: 4.02 MIL/uL (ref 3.87–5.11)
RDW: 12.7 % (ref 11.5–15.5)
WBC: 6.4 10*3/uL (ref 4.0–10.5)
nRBC: 0 % (ref 0.0–0.2)

## 2022-04-07 LAB — URINALYSIS, ROUTINE W REFLEX MICROSCOPIC
Bilirubin Urine: NEGATIVE
Glucose, UA: >=500 mg/dL — AB
Hgb urine dipstick: NEGATIVE
Ketones, ur: NEGATIVE mg/dL
Nitrite: NEGATIVE
Protein, ur: NEGATIVE mg/dL
Specific Gravity, Urine: <=1.005 (ref 1.005–1.030)
pH: 5 (ref 5.0–8.0)

## 2022-04-07 LAB — MAGNESIUM: Magnesium: 2.3 mg/dL (ref 1.7–2.4)

## 2022-04-07 LAB — URINALYSIS, MICROSCOPIC (REFLEX): RBC / HPF: NONE SEEN RBC/hpf (ref 0–5)

## 2022-04-07 LAB — CBG MONITORING, ED: Glucose-Capillary: 151 mg/dL — ABNORMAL HIGH (ref 70–99)

## 2022-04-07 MED ORDER — LACTATED RINGERS IV BOLUS
1000.0000 mL | Freq: Once | INTRAVENOUS | Status: AC
  Administered 2022-04-07: 1000 mL via INTRAVENOUS

## 2022-04-07 MED ORDER — CEPHALEXIN 500 MG PO CAPS: 500.0000 mg | ORAL_CAPSULE | Freq: Two times a day (BID) | ORAL | 0 refills | Status: AC

## 2022-04-07 MED ORDER — GABAPENTIN 100 MG PO CAPS: 100.0000 mg | ORAL_CAPSULE | Freq: Two times a day (BID) | ORAL | 0 refills | Status: DC | PRN

## 2022-04-07 NOTE — ED Triage Notes (Signed)
Left arm weakness, numbness and tingling x 3 days , bilateral feet neuropathy pain . Hx HTn  Pt denies headache or vision changes , ambulatory with steady gait . Alert and oriented x 4 .

## 2022-04-07 NOTE — Discharge Instructions (Addendum)
You were seen for left arm weakness and numbness and tingling and pain.  We suspect this is due to a nerve issue.  You can take Tylenol as needed for pain and if pain is still not controlled you can try gabapentin which we have prescribed.  Make sure to follow-up with your doctor and get involved with physical therapy to help with your issues with your arm.  I have given you referral to neurology.  Call to make an appointment with neurology and the spine doctors as this could be likely due to pinched nerves in your neck.  Your urine suggested possible urinary tract infection.  Take the antibiotics Keflex as prescribed.  Get your kidney function rechecked with your home doctor.  Come back if any signs of stroke such as facial droop, slurred speech, confusion, worsening weakness, leg weakness, or any other symptoms concerning to you.

## 2022-04-07 NOTE — ED Provider Notes (Signed)
[facility] EMERGENCY DEPARTMENT Provider Note   CSN: [id number] Arrival date & time: 04/07/22  1623     History  Chief Complaint  Patient presents with   Extremity Weakness    Mariah Mason is a 71 y.o. female.  With PMH of DM2, HTN, HLD, neuropathy, known left dupuytrens contracture who presents with intermittent left upper extremity weakness numbness and tingling for the past 3 days.  Patient denies any headache, chest pain, injury, slurred speech, visual change, facial droop, leg weakness or any other symptoms.  She does note intermittent numbness and tingling and pain in her left shoulder region that somewhat goes down to her arm.  She has known Dupuytren's contracture that supposed to be operated on by orthopedics and has chronic hand contracture.  Intermittently her arm is too weak to be lifting it or making food like she typically does.  She is denying any pain or weakness currently.  She is not requesting any medications for pain.  Takes Tylenol intermittently which helps with her pain.  She has no neuropathy in both feet which is unchanged.   Extremity Weakness       Home Medications Prior to Admission medications   Medication Sig Start Date End Date Taking? Authorizing Provider  cephALEXin (KEFLEX) 500 MG capsule Take 1 capsule (500 mg total) by mouth 2 (two) times daily for 5 days. 04/07/22 04/12/22 Yes Elgie Congo, MD  gabapentin (NEURONTIN) 100 MG capsule Take 1 capsule (100 mg total) by mouth 2 (two) times daily as needed (Pain not improved with Tylenol). 04/07/22  Yes Elgie Congo, MD  ACCU-CHEK GUIDE test strip TEST BLOOD SUGAR ONCE DAILY TEST BLOOD SUGAR up to TWICE DAILY 02/20/22   [name], FNP  Accu-Chek Softclix Lancets lancets 4 (four) times daily. 04/27/21   [provider]  allopurinol (ZYLOPRIM) 100 MG tablet TAKE 1 TABLET(100 MG) BY MOUTH DAILY 09/26/21   [name], FNP  amLODipine (NORVASC) 10 MG  tablet TAKE 1 TABLET(10 MG) BY MOUTH DAILY 03/05/22   [name], FNP  ammonium lactate (LAC-HYDRIN) 12 % lotion Apply 1 Application topically as needed for dry skin. 03/28/22   [name], DPM  atorvastatin (LIPITOR) 20 MG tablet Take 1 tablet (20 mg total) by mouth daily. 03/28/21   [name], FNP  blood glucose meter kit and supplies KIT Dispense based on patient and insurance preference. Use up to four times daily as directed. (FOR ICD-9 250.00, 250.01). 03/28/21   [name], FNP  carvedilol (COREG) 25 MG tablet TAKE 1 TABLET(25 MG) BY MOUTH TWICE DAILY WITH A MEAL 03/28/21   [name], FNP  cholecalciferol (VITAMIN D3) 25 MCG (1000 UT) tablet Take 1,000 Units by mouth daily.    [provider]  cloNIDine (CATAPRES) 0.1 MG tablet TAKE 1 TABLET(0.1 MG) BY MOUTH EVERY EVENING 12/29/21   [name], FNP  colchicine 0.6 MG tablet Take 2 tablets (1.39m) by mouth at onset for gout, may repeat 1 tablet 0.630min 2 hours is symptoms persist 09/26/21   MuMarrian SalvageFNP  cycloSPORINE (RESTASIS) 0.05 % ophthalmic emulsion Place 1 drop into both eyes 2 (two) times daily.  12/05/18   [provider]  Elastic Bandages & Supports (MEDICAL COMPRESSION SOCKS) MISC Compression socks for DM peripheral neuropathy and bilateral leg swelling 08/02/20   YaMarcial PacasMD  FARXIGA 5 MG TABS tablet TAKE 1 TABLET(5 MG) BY MOUTH DAILY BEFORE BREAKFAST 02/23/22  [name], [place]. Devices [place] Shower chair. Dx- Diabetes mellitus 08/12/19   Charlott Rakes, MD  Multiple Vitamins-Minerals (CENTRUM SILVER ADULT 50+ PO) Take 1 tablet by mouth daily.    [provider]  NONFORMULARY OR COMPOUNDED ITEM Peripheral Neuropathy Cream: Bupivacaine 1%, Doxepin 3%, Gabapentin 6%, Pentoxifylline 3%, Topiramate 1% Order fax[facility] to [facility] Patient not taking: Reported on 01/12/2022 10/05/19   [name], DPM   sitaGLIPtin (JANUVIA) 50 MG tablet TAKE 1 TABLET(50 MG) BY MOUTH DAILY 03/06/22   [name], FNP  valsartan (DIOVAN) 40 MG tablet Take 1 tablet (40 mg total) by mouth daily. 01/12/22   [name], FNP      Allergies    Hydrochlorothiazide, Erythromycin, and Hydralazine    Review of Systems   Review of Systems  Musculoskeletal:  Positive for extremity weakness.    Physical Exam Updated Vital Signs BP 134/73   Pulse 69   Temp 98.2 F (36.8 C)   Resp 15   Wt 90.7 kg   SpO2 99%   BMI 34.33 kg/m  Physical Exam Constitutional: Alert and oriented. Well appearing and in no distress. Eyes: Conjunctivae are normal. ENT      Head: Normocephalic and atraumatic.      Nose: No congestion.      Mouth/Throat: Mucous membranes are moist.      Neck: No stridor.  No midline tenderness step-offs or deformities. Cardiovascular: S1, S2, equal palpable radial pulses warm and well perfused. Respiratory: Normal respiratory effort. Breath sounds are normal.  O2 sat 99 on RA Gastrointestinal: Soft and nondistended  musculoskeletal: Reporting tenderness of the left upper shoulder region but full strength bilateral upper extremities, 5 out of 5 shrug strength, 5 out of 5 elbow flexion and extension, grip strength intact although obvious left hand dupuytrens contracture of palm. Sensation grossly intact throughout arm and fingers of LUE. Cap refill <2 seconds warm and well perfused. Neurologic: Normal speech and language.  CN II through XII grossly intact.  PERRL.  EOMI.  No facial droop.  Equal shrug strength bilaterally and elbow strength and limited full grip strength of left hand but due to known Dupuytren's contracture.  Full 5 out of 5 strength bilateral lower extremities.  Sensation grossly intact throughout bilateral upper and lower extremities.   Skin: Skin is warm, dry and intact. No rash noted. Psychiatric: Mood and affect are normal. Speech and behavior are normal.  ED  Results / Procedures / Treatments   Labs (all labs ordered are listed, but only abnormal results are displayed) Labs Reviewed  URINALYSIS, ROUTINE W REFLEX MICROSCOPIC - Abnormal; Notable for the following components:      Result Value   Color, Urine STRAW (*)    Glucose, UA >=500 (*)    Leukocytes,Ua SMALL (*)    All other components within normal limits  URINALYSIS, MICROSCOPIC (REFLEX) - Abnormal; Notable for the following components:   Bacteria, UA RARE (*)    All other components within normal limits  COMPREHENSIVE METABOLIC PANEL - Abnormal; Notable for the following components:   CO2 20 (*)    Glucose, Bld 130 (*)    BUN 57 (*)    Creatinine, Ser 1.97 (*)    AST 12 (*)    GFR, Estimated 27 (*)    All other components within normal limits  CBC WITH DIFFERENTIAL/PLATELET - Abnormal; Notable for the following components:   Hemoglobin 10.9 (*)    HCT 33.9 (*)  All other components within normal limits  CBG MONITORING, ED - Abnormal; Notable for the following components:   Glucose-Capillary 151 (*)    All other components within normal limits  MAGNESIUM    EKG EKG Interpretation  Date/Time:  Saturday April 07 2022 17:50:34 EST Ventricular Rate:  79 PR Interval:  168 QRS Duration: 76 QT Interval:  374 QTC Calculation: 428 R Axis:   31 Text Interpretation: Normal sinus rhythm Normal ECG When compared with ECG of 02-Oct-2021 16:50, PREVIOUS ECG IS PRESENT Confirmed by Georgina Snell [phone]) on 04/07/2022 7:21:21 PM  Radiology CT Head Wo Contrast  Result Date: 04/07/2022 CLINICAL DATA:  Localized left upper extremity weakness and paresthesias. Left arm weakness. EXAM: CT HEAD WITHOUT CONTRAST CT CERVICAL SPINE WITHOUT CONTRAST TECHNIQUE: Multidetector CT imaging of the head and cervical spine was performed following the standard protocol without intravenous contrast. Multiplanar CT image reconstructions of the cervical spine were also generated. RADIATION DOSE  REDUCTION: This exam was performed according to the departmental dose-optimization program which includes automated exposure control, adjustment of the mA and/or kV according to patient size and/or use of iterative reconstruction technique. COMPARISON:  CT examination dated October 02, 2021 FINDINGS: CT HEAD FINDINGS Brain: No evidence of acute infarction, hemorrhage, hydrocephalus, extra-axial collection or mass lesion/mass effect. Patchy area of low-attenuation of the periventricular and subcortical white matter presumed chronic microvascular ischemic changes. Vascular: No hyperdense vessel or unexpected calcification. Skull: Normal. Negative for fracture or focal lesion. Sinuses/Orbits: No acute finding. Other: None. CT CERVICAL SPINE FINDINGS Alignment: Straightening of the cervical spine. Skull base and vertebrae: No acute fracture. No primary bone lesion or focal pathologic process. Soft tissues and spinal canal: Ossification of the posterior longitudinal ligament with spinal canal stenosis up to C3. Disc levels: C2-C3: Spinal canal stenosis secondary to calcification of the posterior longitudinal ligament. Uncovertebral joint arthropathy without significant neural foraminal stenosis. C3-C4: Uncovertebral joint arthropathy with mild bilateral neural foraminal stenosis. C4-C5: Disc height loss and uncovertebral joint arthropathy. Mild bilateral facet joint arthropathy. No significant neural foraminal stenosis. C5-C6: Disc height loss and uncovertebral joint arthropathy with mild right neural foraminal stenosis. C6-C7: Disc height loss and uncovertebral joint arthropathy with mild bilateral neural foraminal stenosis. C7-T1:  No significant findings. Upper chest: Negative. Other: None IMPRESSION: CT HEAD: 1. No acute intracranial abnormality. 2. Chronic microvascular ischemic changes of the white matter. CT CERVICAL SPINE: 1. No acute fracture or subluxation. 2. Multilevel degenerative disc disease and uncovertebral  joint arthropathy with mild neural foraminal stenosis at C3-C4, C5-C6 and C6-C7. 3. Ossification of the posterior longitudinal ligament with spinal canal stenosis up to C3. Electronically Signed   By: Keane Police D.O.   On: 04/07/2022 22:18   CT Cervical Spine Wo Contrast  Result Date: 04/07/2022 CLINICAL DATA:  Localized left upper extremity weakness and paresthesias. Left arm weakness. EXAM: CT HEAD WITHOUT CONTRAST CT CERVICAL SPINE WITHOUT CONTRAST TECHNIQUE: Multidetector CT imaging of the head and cervical spine was performed following the standard protocol without intravenous contrast. Multiplanar CT image reconstructions of the cervical spine were also generated. RADIATION DOSE REDUCTION: This exam was performed according to the departmental dose-optimization program which includes automated exposure control, adjustment of the mA and/or kV according to patient size and/or use of iterative reconstruction technique. COMPARISON:  CT examination dated October 02, 2021 FINDINGS: CT HEAD FINDINGS Brain: No evidence of acute infarction, hemorrhage, hydrocephalus, extra-axial collection or mass lesion/mass effect. Patchy area of low-attenuation of the periventricular and subcortical white matter presumed  chronic microvascular ischemic changes. Vascular: No hyperdense vessel or unexpected calcification. Skull: Normal. Negative for fracture or focal lesion. Sinuses/Orbits: No acute finding. Other: None. CT CERVICAL SPINE FINDINGS Alignment: Straightening of the cervical spine. Skull base and vertebrae: No acute fracture. No primary bone lesion or focal pathologic process. Soft tissues and spinal canal: Ossification of the posterior longitudinal ligament with spinal canal stenosis up to C3. Disc levels: C2-C3: Spinal canal stenosis secondary to calcification of the posterior longitudinal ligament. Uncovertebral joint arthropathy without significant neural foraminal stenosis. C3-C4: Uncovertebral joint arthropathy with  mild bilateral neural foraminal stenosis. C4-C5: Disc height loss and uncovertebral joint arthropathy. Mild bilateral facet joint arthropathy. No significant neural foraminal stenosis. C5-C6: Disc height loss and uncovertebral joint arthropathy with mild right neural foraminal stenosis. C6-C7: Disc height loss and uncovertebral joint arthropathy with mild bilateral neural foraminal stenosis. C7-T1:  No significant findings. Upper chest: Negative. Other: None IMPRESSION: CT HEAD: 1. No acute intracranial abnormality. 2. Chronic microvascular ischemic changes of the white matter. CT CERVICAL SPINE: 1. No acute fracture or subluxation. 2. Multilevel degenerative disc disease and uncovertebral joint arthropathy with mild neural foraminal stenosis at C3-C4, C5-C6 and C6-C7. 3. Ossification of the posterior longitudinal ligament with spinal canal stenosis up to C3. Electronically Signed   By: Keane Police D.O.   On: 04/07/2022 22:18   DG Chest Portable 1 View  Result Date: 04/07/2022 CLINICAL DATA:  Peripheral swelling EXAM: PORTABLE CHEST 1 VIEW COMPARISON:  None Available. FINDINGS: The heart size and mediastinal contours are within normal limits. Both lungs are clear. No pneumothorax or pleural effusion the visualized skeletal structures are unremarkable. IMPRESSION: No active disease. Electronically Signed   By: Sammie Bench M.D.   On: 04/07/2022 20:51    Procedures Procedures    Medications Ordered in ED Medications  lactated ringers bolus 1,000 mL (0 mLs Intravenous Stopped 04/07/22 2255)    ED Course/ Medical Decision Making/ A&P                           Medical Decision Making  Karyssa Amaral is a 71 y.o. female.  With PMH of DM2, HTN, HLD, neuropathy, known left dupuytrens contracture who presents with intermittent left upper extremity weakness numbness and tingling for the past 3 days.    EKG reviewed normal sinus rhythm with no acute ischemic changes.  Denying any chest pain or other  cardiopulmonary symptoms, no concern for ACS.  Patient's presentation seems most consistent with a mononeuropathy and possible cervical radiculopathy.  She has slightly decreased grip strength of her left hand but this is limited in the setting of known Dupuytren's contracture otherwise her neurologic exam is quite normal and there are no other focal deficits and no other symptoms suggestive of CVA.  I suspect CVA is highly unlikely based off reassuring exam.  Also consider possible electrolyte abnormalities or vitamin deficiencies among other possible etiogies.  Labs reviewed.  She has chronic elevated creatinine 1.97 with elevated BUN today 57 no complaints of bleeding.  Given IV fluids 1 L.  Mild anemia hemoglobin 10.9.  No other acute findings.  UA suggestive of possible UTI small leukocyte esterase 11-20 WBCs and rare bacteria.  CT head obtained with no acute intracranial abnormalities I personally reviewed this.  CT C-spine with evidence of degenerative disc disease and neuroforaminal stenosis from C3-C7.  Suspect she has likely associated radicular pain and mononeuropathy as discussed in my MDM.  Since she is  asymptomatic here, discussed stroke return precautions and made referral to spine center and neurology.  Prescribed Keflex for possible UTI and gabapentin for suspected radiculopathy pain. Patient and family in agreement with plan and safe for discharge,  Amount and/or Complexity of Data Reviewed Labs: ordered. Radiology: ordered.  Risk Prescription drug management.     Final Clinical Impression(s) / ED Diagnoses Final diagnoses:  LUE weakness  Radiculopathy affecting upper extremity  Cervical stenosis of spinal canal  Urinary tract infection without hematuria, site unspecified    Rx / DC Orders ED Discharge Orders          Ordered    Ambulatory referral to Neurology       Comments: An appointment is requested in approximately: 1 week   04/07/22 2231    gabapentin  (NEURONTIN) 100 MG capsule  2 times daily PRN        04/07/22 2245    cephALEXin (KEFLEX) 500 MG capsule  2 times daily        04/07/22 2245              Elgie Congo, MD 04/08/22 0000

## 2022-04-07 NOTE — ED Notes (Signed)
Pt care taken, awaiting md evaluation.

## 2022-06-04 ENCOUNTER — Other Ambulatory Visit: Payer: Self-pay | Admitting: Family

## 2022-06-04 DIAGNOSIS — E1169 Type 2 diabetes mellitus with other specified complication: Secondary | ICD-10-CM

## 2022-06-04 DIAGNOSIS — I152 Hypertension secondary to endocrine disorders: Secondary | ICD-10-CM

## 2022-06-22 IMAGING — CT CT HEAD W/O CM
4 series · 15 of 47 positions shown, 17 images · non-contrast
Comparison: 08/16/2020

CLINICAL DATA: Headache, new or worsening. Dizziness. Sudden onset
beginning 3 days ago.



[Series 2: head wo · axial · 0.46mm/px · z∈[+1183,+1308]mm · 7 of 35 slices shown, 9 images]
[im 5/35  brain]
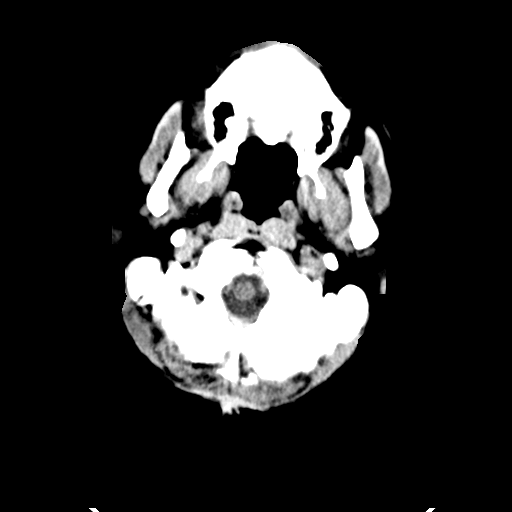
[im 5/35  bone]
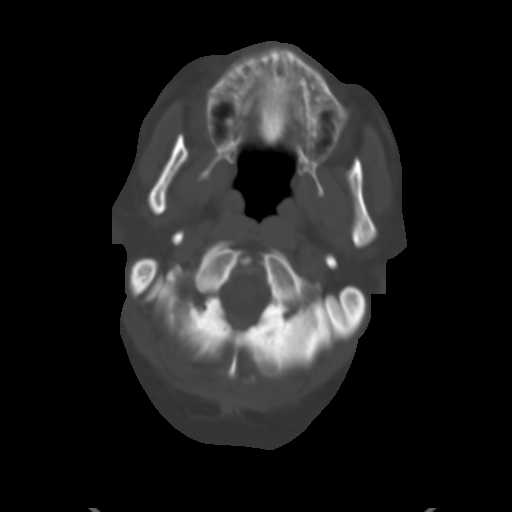
[im 9/35  brain]
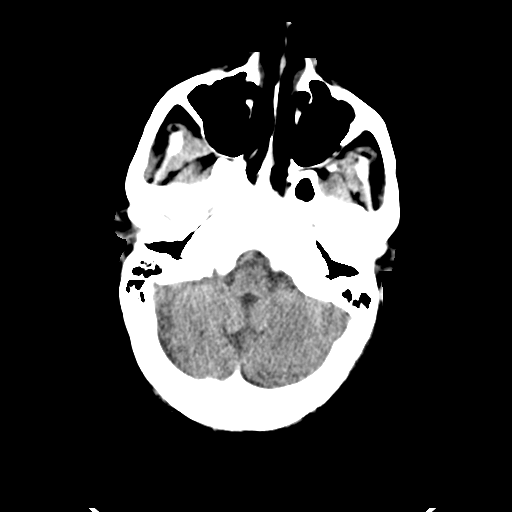
[im 13/35  brain]
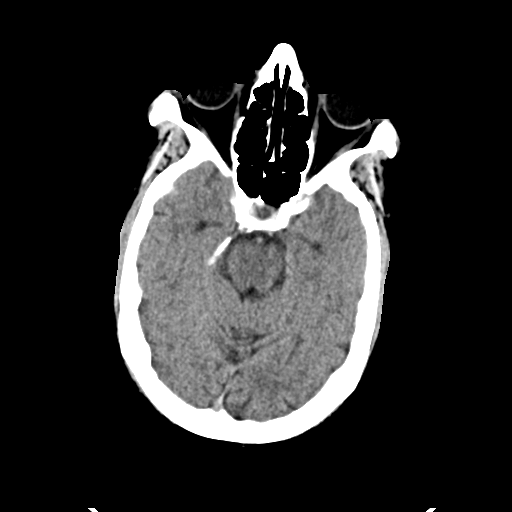
[im 18/35  brain]
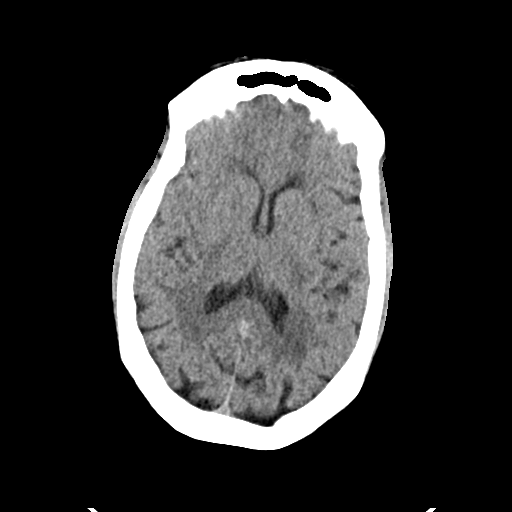
[im 22/35  brain]
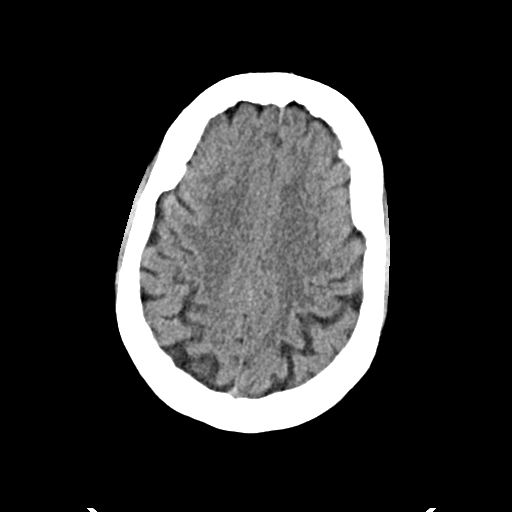
[im 22/35  bone]
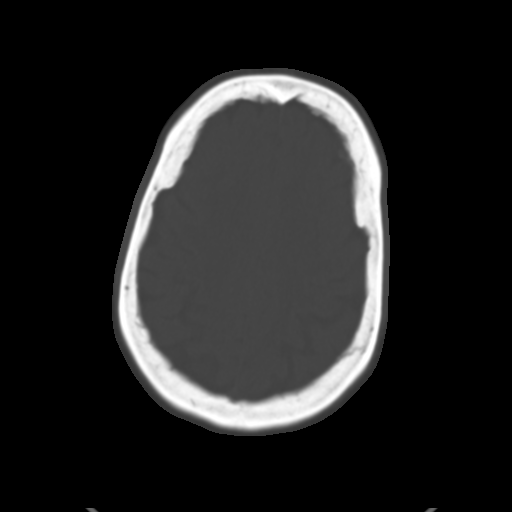
[im 26/35  brain]
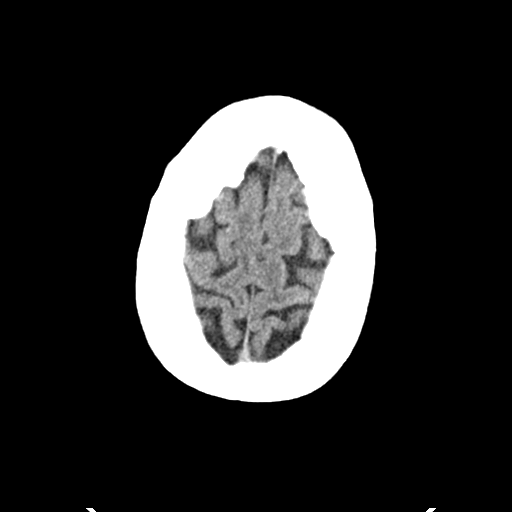
[im 30/35  brain]
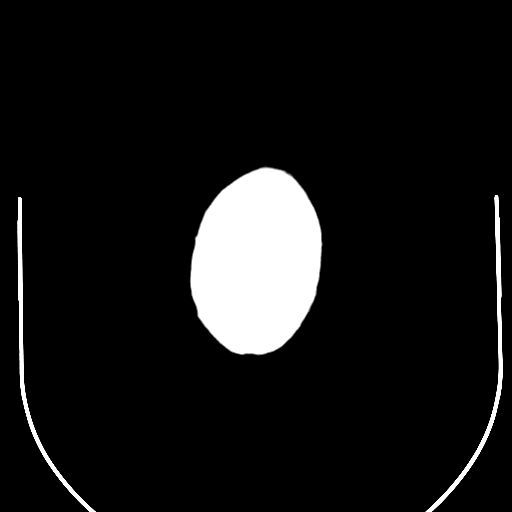

[Series 3: head bone · axial · 0.46mm/px · z∈[+1179,+1197]mm · 2 of 87 slices shown]
[im 9/87  bone]
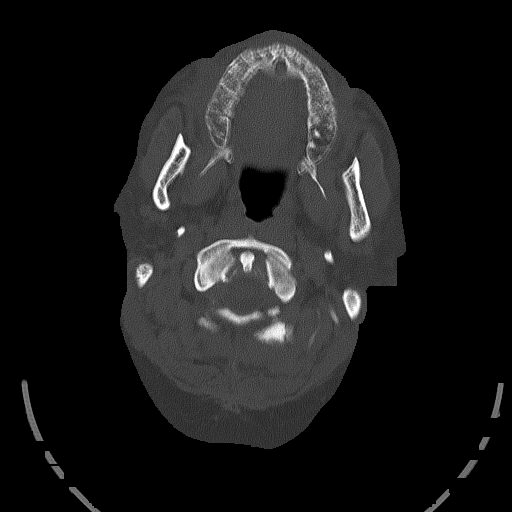
[im 18/87  bone]
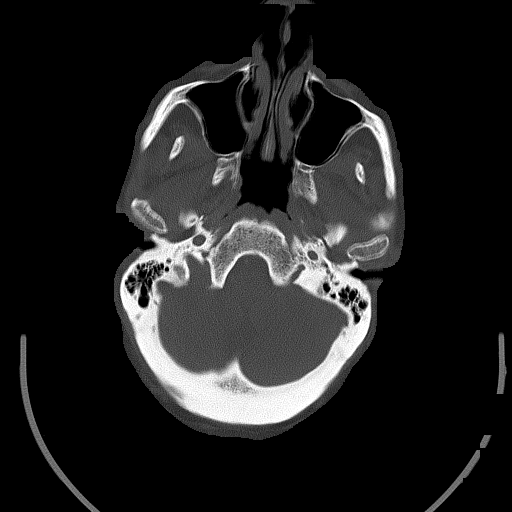

[Series 4: coronal soft · coronal · 0.34mm/px · 3 of 67 slices shown]
[im 23/67  brain]
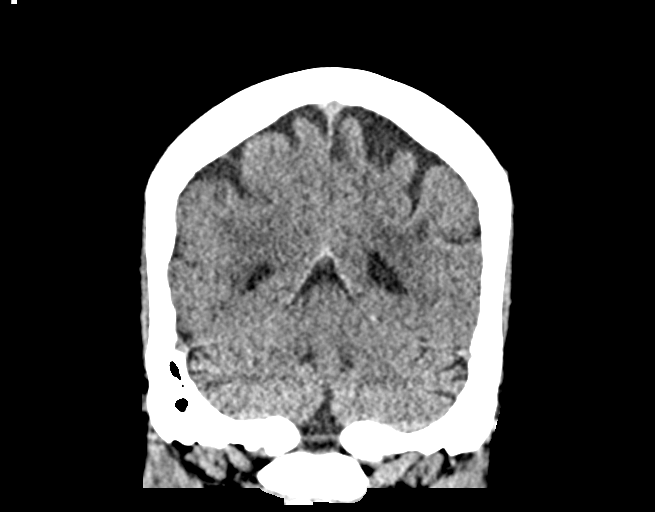
[im 30/67  brain]
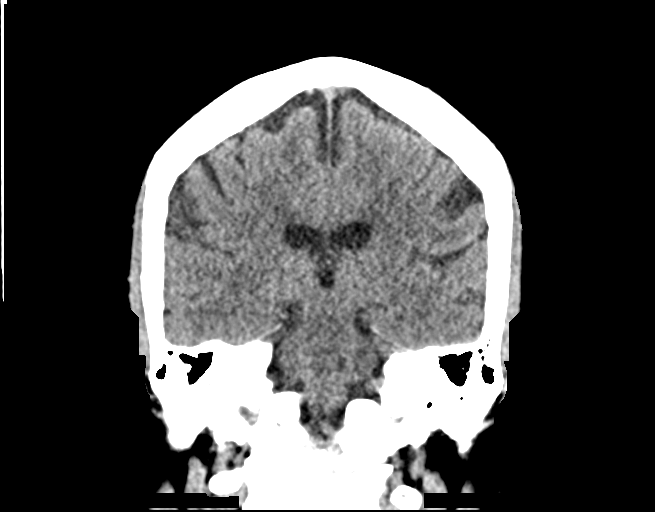
[im 37/67  brain]
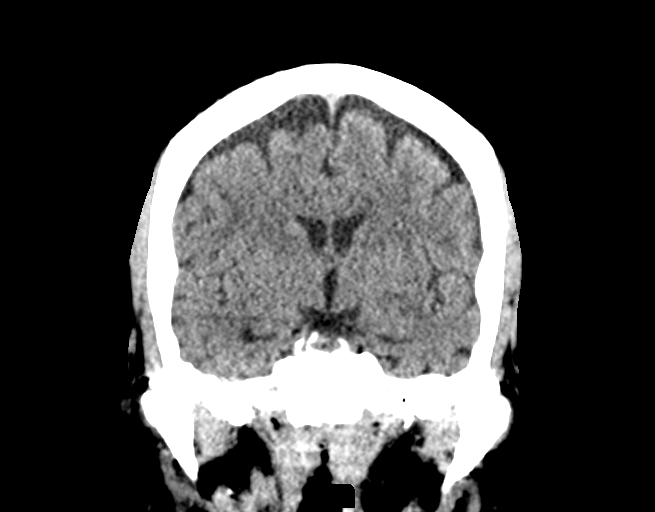

[Series 5: sag soft · sagittal · 0.34mm/px · 3 of 67 slices shown]
[im 23/67  brain]
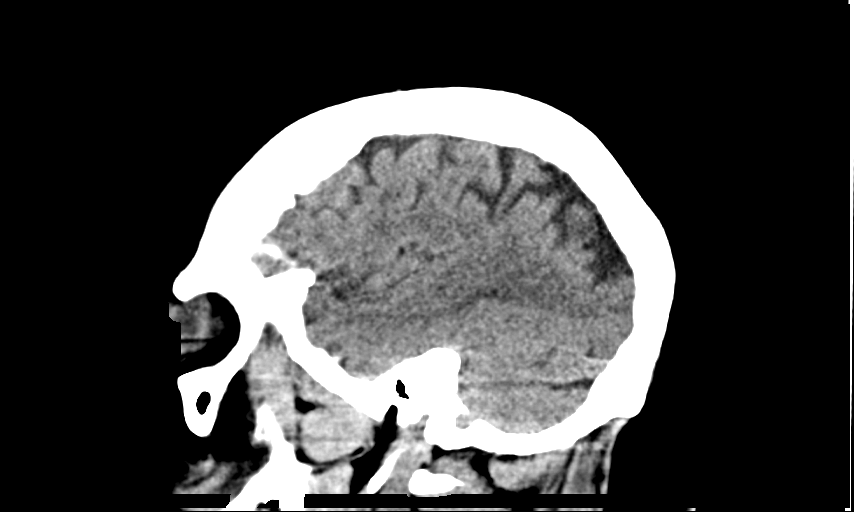
[im 34/67  brain]
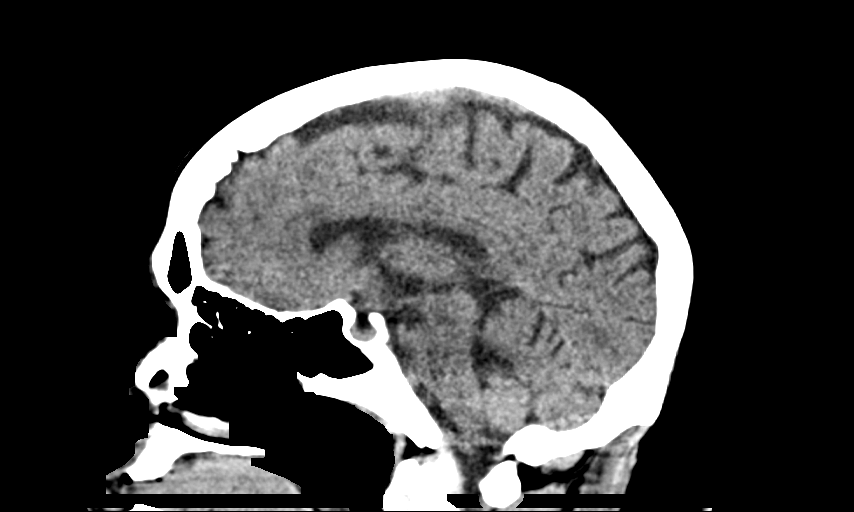
[im 45/67  brain]
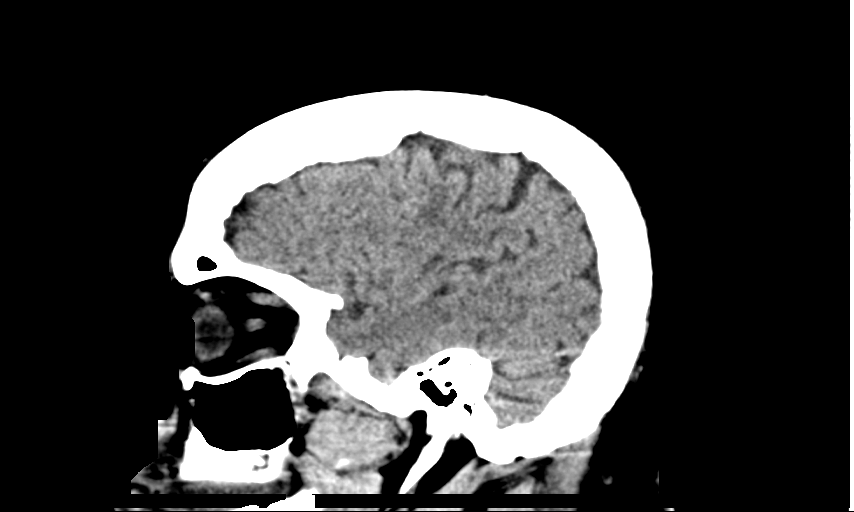

[15 of 47 positions shown; findings below may reference images not displayed]

FINDINGS: Brain: Chronic small-vessel ischemic changes affect the cerebral
hemispheric white matter. No sign of acute infarction, mass lesion,
hemorrhage, hydrocephalus or extra-axial collection.

Vascular: There is atherosclerotic calcification of the major
vessels at the base of the brain.

Skull: Negative

Sinuses/Orbits: Clear/normal

Other: None
IMPRESSION: No acute finding. No cause of headache identified. Chronic
small-vessel ischemic changes of the cerebral hemispheric white
matter.

## 2022-07-13 ENCOUNTER — Ambulatory Visit: Payer: Medicare Other | Admitting: Family

## 2022-07-17 ENCOUNTER — Ambulatory Visit (INDEPENDENT_AMBULATORY_CARE_PROVIDER_SITE_OTHER): Payer: Medicare Other | Admitting: Family

## 2022-07-17 ENCOUNTER — Encounter: Payer: Self-pay | Admitting: Family

## 2022-07-17 VITALS — BP 120/68 | Resp 18 | Ht 64.0 in | Wt 196.2 lb

## 2022-07-17 DIAGNOSIS — E1149 Type 2 diabetes mellitus with other diabetic neurological complication: Secondary | ICD-10-CM | POA: Diagnosis not present

## 2022-07-17 DIAGNOSIS — Z1211 Encounter for screening for malignant neoplasm of colon: Secondary | ICD-10-CM

## 2022-07-17 DIAGNOSIS — Z794 Long term (current) use of insulin: Secondary | ICD-10-CM | POA: Diagnosis not present

## 2022-07-17 DIAGNOSIS — N189 Chronic kidney disease, unspecified: Secondary | ICD-10-CM

## 2022-07-17 LAB — COMPREHENSIVE METABOLIC PANEL
ALT: 9 U/L (ref 0–35)
AST: 11 U/L (ref 0–37)
Albumin: 4.2 g/dL (ref 3.5–5.2)
Alkaline Phosphatase: 61 U/L (ref 39–117)
BUN: 42 mg/dL — ABNORMAL HIGH (ref 6–23)
CO2: 21 mEq/L (ref 19–32)
Calcium: 9.5 mg/dL (ref 8.4–10.5)
Chloride: 109 mEq/L (ref 96–112)
Creatinine, Ser: 1.59 mg/dL — ABNORMAL HIGH (ref 0.40–1.20)
GFR: 32.28 mL/min — ABNORMAL LOW (ref >60.00)
Glucose, Bld: 151 mg/dL — ABNORMAL HIGH (ref 70–99)
Potassium: 4.4 mEq/L (ref 3.5–5.1)
Sodium: 140 mEq/L (ref 135–145)
Total Bilirubin: 0.4 mg/dL (ref 0.2–1.2)
Total Protein: 7.5 g/dL (ref 6.0–8.3)

## 2022-07-17 LAB — HEMOGLOBIN A1C: Hgb A1c MFr Bld: 6.7 % — ABNORMAL HIGH (ref 4.6–6.5)

## 2022-07-17 NOTE — Progress Notes (Signed)
Mariah Mason is a 72 y.o. female with the following history as recorded in EpicCare:  Patient Active Problem List   Diagnosis Date Noted   Bilateral lumbar radiculopathy 08/22/2020   DM type 2 with diabetic peripheral neuropathy ([place]) 08/02/2020   Gait abnormality 08/02/2020   Gout    Hypertension    Type 2 diabetes mellitus ([place]) 02/18/2019   AKI (acute kidney injury) ([place]) 02/18/2019   Hyperkalemia [id number]   Metabolic acidosis [id number]   Hypoglycemia 02/17/2019   Long term current use of oral hypoglycemic drug 05/23/2017   Presbyopia of both eyes 05/23/2017   Vitreous floaters of both eyes 05/23/2017   Keratoconjunctivitis sicca of both eyes not specified as Sjogren's 10/16/2016   Nuclear sclerotic cataract of both eyes 10/16/2016    Current Outpatient Medications  Medication Sig Dispense Refill   ACCU-CHEK GUIDE test strip TEST BLOOD SUGAR ONCE DAILY TEST BLOOD SUGAR up to TWICE DAILY 100 each 3   Accu-Chek Softclix Lancets lancets 4 (four) times daily.     allopurinol (ZYLOPRIM) 100 MG tablet TAKE 1 TABLET(100 MG) BY MOUTH DAILY 90 tablet 3   amLODipine (NORVASC) 10 MG tablet TAKE 1 TABLET(10 MG) BY MOUTH DAILY 90 tablet 3   ammonium lactate (LAC-HYDRIN) 12 % lotion Apply 1 Application topically as needed for dry skin. 400 g 5   atorvastatin (LIPITOR) 20 MG tablet TAKE 1 TABLET(20 MG) BY MOUTH DAILY 90 tablet 3   blood glucose meter kit and supplies KIT Dispense based on patient and insurance preference. Use up to four times daily as directed. (FOR ICD-9 250.00, 250.01). 1 each 0   carvedilol (COREG) 25 MG tablet TAKE 1 TABLET(25 MG) BY MOUTH TWICE DAILY WITH A MEAL 180 tablet 3   cholecalciferol (VITAMIN D3) 25 MCG (1000 UT) tablet Take 1,000 Units by mouth daily.     cloNIDine (CATAPRES) 0.1 MG tablet TAKE 1 TABLET(0.1 MG) BY MOUTH EVERY EVENING 90 tablet 1   colchicine 0.6 MG tablet Take 2 tablets (1.2mg ) by mouth at onset for gout, may repeat 1 tablet 0.6mg  in 2 hours  is symptoms persist 30 tablet 1   cycloSPORINE (RESTASIS) 0.05 % ophthalmic emulsion Place 1 drop into both eyes 2 (two) times daily.      Elastic Bandages & Supports (MEDICAL COMPRESSION SOCKS) MISC Compression socks for DM peripheral neuropathy and bilateral leg swelling 2 each 3   FARXIGA 5 MG TABS tablet TAKE 1 TABLET(5 MG) BY MOUTH DAILY BEFORE BREAKFAST 30 tablet 4   Misc. Devices [place] Shower chair. Dx- Diabetes mellitus 1 each 0   Multiple Vitamins-Minerals (CENTRUM SILVER ADULT 50+ PO) Take 1 tablet by mouth daily.     NONFORMULARY OR COMPOUNDED ITEM Peripheral Neuropathy Cream: Bupivacaine 1%, Doxepin 3%, Gabapentin 6%, Pentoxifylline 3%, Topiramate 1% Order faxed to [place] 1 each 3   sitaGLIPtin (JANUVIA) 50 MG tablet TAKE 1 TABLET(50 MG) BY MOUTH DAILY 90 tablet 1   valsartan (DIOVAN) 40 MG tablet Take 1 tablet (40 mg total) by mouth daily. 90 tablet 1   No current facility-administered medications for this visit.    Allergies: Hydrochlorothiazide, Erythromycin, and Hydralazine  Past Medical History:  Diagnosis Date   Diabetes mellitus without complication ([place])    Gout    Hyperlipemia    Hypertension    Neuropathy     Past Surgical History:  Procedure Laterality Date   CYST EXCISION N/A 08/30/2020   Procedure: EXCISION 1X2 cm LEFT NECK SEBACEOUS CYST EXCISION 2X2 CM RIGHT BACK  SEBACEOUS CYST;  Surgeon: Rolm Bookbinder, MD;  Location: [facility];  Service: General;  Laterality: N/A;   THYROID SURGERY      Family History  Problem Relation Age of Onset   Heart attack Mother    Kidney failure Father     Social History   Tobacco Use   Smoking status: Never   Smokeless tobacco: Never  Substance Use Topics   Alcohol use: Never    Subjective:   Accompanied by her daughter; 6 month follow up on chronic care needs; continuing to work with nephrology and "feeling very good." According to patient and daughter, kidney functions have improved  with changes and will be seeing nephrology every 4 months for continued care;    Objective:  Vitals:   07/17/22 0908  BP: 120/68  Resp: 18  Weight: 196 lb 3.2 oz (89 kg)  Height: 5\' 4"  (1.626 m)    General: Well developed, well nourished, in no acute distress  Skin : Warm and dry.  Head: Normocephalic and atraumatic  Eyes: Sclera and conjunctiva clear; pupils round and reactive to light; extraocular movements intact  Ears: External normal; canals clear; tympanic membranes normal  Oropharynx: Pink, supple. No suspicious lesions  Neck: Supple without thyromegaly, adenopathy  Lungs: Respirations unlabored; clear to auscultation bilaterally without wheeze, rales, rhonchi  CVS exam: normal rate and regular rhythm.  Neurologic: Alert and oriented; speech intact; face symmetrical; uses cane;  Assessment:  1. Type 2 diabetes mellitus with other neurologic complication, with long-term current use of insulin ([place])   2. Colon cancer screening   3. Chronic kidney disease, unspecified CKD stage     Plan:  Update labs today; patient will continue to see her nephrologist every 4 months;  Order updated for Cologuard as well; Follow up in 9 months, sooner prn.   No follow-ups on file.  Orders Placed This Encounter  Procedures   Cologuard   Comp Met (CMET)   Hemoglobin A1c    Requested Prescriptions    No prescriptions requested or ordered in this encounter

## 2022-07-18 ENCOUNTER — Ambulatory Visit: Payer: Medicare Other | Admitting: Podiatry

## 2022-07-22 ENCOUNTER — Other Ambulatory Visit: Payer: Self-pay | Admitting: Family

## 2022-08-06 ENCOUNTER — Telehealth: Payer: Self-pay | Admitting: Family

## 2022-08-06 NOTE — Telephone Encounter (Signed)
Prescription Request  08/06/2022  Is this a "Controlled Substance" medicine? No  LOV: 07/17/2022  What is the name of the medication or equipment?   valsartan (DIOVAN) 40 MG tablet [[id number]]   Pt is wanting Rx written specifically for the one manufactured by [employer]  Have you contacted your pharmacy to request a refill? No   Which pharmacy would you like this sent to?   [id number] - [place], [place] - [address] [place] [address] Phone: [phone] Fax: [phone]  Patient notified that their request is being sent to the clinical staff for review and that they should receive a response within 2 business days.   Please advise at Mobile [phone] (mobile)

## 2022-08-07 MED ORDER — VALSARTAN 40 MG PO TABS: 40.0000 mg | ORAL_TABLET | Freq: Every day | ORAL | 1 refills | Status: DC

## 2022-08-07 NOTE — Telephone Encounter (Addendum)
Pt daughter notified and she stated that they usually get Materials engineer.  I put a note to pharmacy.

## 2022-08-07 NOTE — Addendum Note (Signed)
Addended by: Thelma Barge D on: 08/07/2022 09:55 AM   Modules accepted: Orders

## 2022-08-23 ENCOUNTER — Telehealth: Payer: Self-pay | Admitting: Family

## 2022-08-23 NOTE — Telephone Encounter (Signed)
Patient's daughter called requesting to speak to Tammy about a supplement her mom wants to take. She is requesting a call back whenever possible. Please advise.

## 2022-08-24 NOTE — Telephone Encounter (Signed)
Tried to call patient's daughter. LM on VM with CB# [phone] or [phone]

## 2022-08-28 ENCOUNTER — Other Ambulatory Visit: Payer: Medicare Other | Admitting: Pharmacist

## 2022-08-28 NOTE — Telephone Encounter (Signed)
Patient's daughter wanted to ask about [facility] Healthy Feet and Nerves.  Reviewed ingredients below Chromium and zinc:  Alpha-lipoic acid:  B-vitamins:  Boswellia Serrata:   None of these ingredients should interact with her current medications or affect current medication conditions.  I did recommend monitoring for any GI symptoms as some patients that take Boswellia can have GI issues - increase in gastritis, stomach pain, reflux.

## 2022-08-28 NOTE — Progress Notes (Signed)
08/28/2022 Name: Mariah Mason MRN: [id number] DOB: 09-01-1950  Chief Complaint  Patient presents with   Medication Management    question    Mariah Mason is a 72 y.o. year old female who presented for a telephone visit.   They were referred to the pharmacist by their PCP for assistance in managing  / discussing supplement questions .   Subjective:  Patient's daughter has question about Mariah Mason Healthy Feet and Nerves and if it would be OK for her mother to try for neuropathy. Mariah Mason has tried gabapentin in past but stopped because is made her very sleepy.    Reviewed ingredients of Mariah Mason natural healthy feet and nerves.   Chromium and zinc:  Alpha-lipoic acid:  B-vitamins:  Boswellia Serrata:          Medication Access/Adherence  Current Pharmacy:  [facility] [phone] - [place] - [address] [place] [address] Phone: [phone] Fax: [phone]  [facility] [address] [place] [address] Phone: [phone] Fax: [phone]  [id number] - [place], [place] - [address] AT [facility] OF [address] [place] [address] Phone: [phone] Fax: [phone]  [facility] - [place], [place] - One [facility] AT Portal to Registered [facility] Sites [address] [place] [address] Phone: [phone] Fax: [phone]    Objective:  Lab Results  Component Value Date   HGBA1C 6.7 (H) 07/17/2022    Lab Results  Component Value Date   CREATININE 1.59 (H) 07/17/2022   BUN 42 (H) 07/17/2022   NA 140 07/17/2022   K 4.4 07/17/2022   CL 109 07/17/2022   CO2 21 07/17/2022    Lab Results  Component Value Date   CHOL 202 (H) 03/28/2021   HDL 35.30 (L) 03/28/2021   LDLCALC 132 (H) 03/28/2021   TRIG 176.0 (H) 03/28/2021   CHOLHDL 6 03/28/2021    Medications  Reviewed Today     Reviewed by Henrene Pastor, RPH-CPP (Pharmacist) on 08/28/22 at 1444  Med List Status: <None>   Medication Order Taking? Sig Documenting Provider Last Dose Status Informant  ACCU-CHEK GUIDE test strip [id number] No TEST BLOOD SUGAR ONCE DAILY TEST BLOOD SUGAR up to TWICE DAILY Olive Bass, FNP Taking Active   Accu-Chek Softclix Lancets lancets [id number] No 4 (four) times daily. [provider] Taking Active   allopurinol (ZYLOPRIM) 100 MG tablet [id number] No TAKE 1 TABLET(100 MG) BY MOUTH DAILY Olive Bass, FNP Taking Active   amLODipine (NORVASC) 10 MG tablet [id number] No TAKE 1 TABLET(10 MG) BY MOUTH DAILY Olive Bass, FNP Taking Active   ammonium lactate (LAC-HYDRIN) 12 % lotion [id number] No Apply 1 Application topically as needed for dry skin. Freddie Breech, DPM Taking Active   atorvastatin (LIPITOR) 20 MG tablet [id number] No TAKE 1 TABLET(20 MG) BY MOUTH DAILY Olive Bass, FNP Taking Active   blood glucose meter kit and supplies KIT [id number] No Dispense based on patient and insurance preference. Use up to four times daily as directed. (FOR ICD-9 250.00, 250.01). Olive Bass, FNP Taking Active   carvedilol (COREG) 25 MG tablet [id number] No TAKE 1 TABLET(25 MG) BY MOUTH TWICE DAILY WITH A MEAL Olive Bass, FNP Taking Active   cholecalciferol (VITAMIN D3) 25 MCG (1000 UT) tablet [id number] No Take  1,000 Units by mouth daily. [provider] Taking Active Child  cloNIDine (CATAPRES) 0.1 MG tablet [id number] No TAKE 1 TABLET(0.1 MG) BY MOUTH EVERY EVENING [place] [employer] Allyne Gee, FNP Taking Active   colchicine 0.6 MG tablet [id number] No Take 2 tablets (1.2mg ) by mouth at onset for gout, may repeat 1 tablet 0.6mg  in 2 hours is symptoms persist Olive Bass, FNP Taking Active   cycloSPORINE (RESTASIS) 0.05 % ophthalmic emulsion [id number] No Place 1 drop into both eyes 2 (two) times daily.   [provider] Taking Active Child  dapagliflozin propanediol (FARXIGA) 5 MG TABS tablet [id number]  Take 1 tablet (5 mg total) by mouth daily. Olive Bass, FNP  Active   Elastic Bandages & Supports (MEDICAL COMPRESSION SOCKS) MISC [id number] No Compression socks for DM peripheral neuropathy and bilateral leg swelling Levert Feinstein, MD Taking Active            Med Note [facility], Doristine Johns Mar 06, 2021  1:26 PM)    Misc. Devices MISC [id number] No Shower chair. Dx- Diabetes mellitus Hoy Register, MD Taking Active   Multiple Vitamins-Minerals (CENTRUM SILVER ADULT 50+ PO) [id number] No Take 1 tablet by mouth daily. [provider] Taking Active Child  NONFORMULARY OR COMPOUNDED ITEM [id number] No Peripheral Neuropathy Cream: Bupivacaine 1%, Doxepin 3%, Gabapentin 6%, Pentoxifylline 3%, Topiramate 1% Order faxed to [facility], [place]ise [place], [place] Taking Active   sitaGLIPtin (JANUVIA) 50 MG tablet [id number] No TAKE 1 TABLET(50 MG) BY MOUTH DAILY Olive Bass, FNP Taking Active   valsartan (DIOVAN) 40 MG tablet [id number]  Take 1 tablet (40 mg total) by mouth daily. PT PREFERS [facility] MANUFACTURER Olive Bass, FNP  Active               Assessment/Plan:   Medication Management: - reviewed med list and refill history.   - reviewed ingredients to [employer] and nerve Supplement.  None of ingredients should interact with her current medications or affect current medication conditions.  Recommend monitoring for any GI symptoms as some patients that take Boswellia can have GI issues - increase in gastritis, stomach pain, reflux.   Henrene Pastor, PharmD Clinical Pharmacist [facility]

## 2022-09-02 ENCOUNTER — Other Ambulatory Visit: Payer: Self-pay | Admitting: Family

## 2022-09-02 DIAGNOSIS — E1149 Type 2 diabetes mellitus with other diabetic neurological complication: Secondary | ICD-10-CM

## 2022-10-03 LAB — COLOGUARD: COLOGUARD: NEGATIVE

## 2022-11-14 ENCOUNTER — Ambulatory Visit (INDEPENDENT_AMBULATORY_CARE_PROVIDER_SITE_OTHER): Payer: Medicare Other | Admitting: Podiatry

## 2022-11-14 ENCOUNTER — Encounter: Payer: Self-pay | Admitting: Podiatry

## 2022-11-14 VITALS — BP 154/70

## 2022-11-14 DIAGNOSIS — B351 Tinea unguium: Secondary | ICD-10-CM

## 2022-11-14 DIAGNOSIS — M79609 Pain in unspecified limb: Secondary | ICD-10-CM | POA: Diagnosis not present

## 2022-11-14 DIAGNOSIS — E1142 Type 2 diabetes mellitus with diabetic polyneuropathy: Secondary | ICD-10-CM

## 2022-11-14 NOTE — Progress Notes (Signed)
  Subjective:  Patient ID: Mariah Mason, female    DOB: Nov 26, 1950,  MRN: [id number]  Uriana Bueche presents to clinic today for at risk foot care with history of diabetic neuropathy and painful thick toenails that are difficult to trim. Pain interferes with ambulation. Aggravating factors include wearing enclosed shoe gear. Pain is relieved with periodic professional debridement. Patient states she is dealing with knee OA R>L and trigger finger of her left hand.  Chief Complaint  Patient presents with   Nail Problem    DFC:6.7-A1C,Referring Provider Olive Bass, FNP,lov:03/24      New problem(s): None.   PCP is Olive Bass, FNP.  Allergies  Allergen Reactions   Hydrochlorothiazide     Other Reaction(s): dizziness   Erythromycin Other (See Comments)    Does not remember reaction.   Hydralazine Other (See Comments)    Not able to tolerate per patient; caused her to feel dizzy    Review of Systems: Negative except as noted in the HPI.  Objective: No changes noted in today's physical examination. Vitals:   11/14/22 1014 11/14/22 1015  BP: (!) 166/72 (!) 154/70   Othelia Mi is a pleasant 72 y.o. female WD, WN in NAD. AAO x 3.  Vascular Examination: CFT <3 seconds b/l. DP/PT pulses faintly palpable b/l. Skin temperature gradient warm to warm b/l. No pain with calf compression. No ischemia or gangrene. No cyanosis or clubbing noted b/l. Pedal hair absent.   Neurological Examination: Sensation grossly intact b/l with 10 gram monofilament. Vibratory sensation intact b/l.   Dermatological Examination: Pedal skin warm and supple b/l.   No open wounds. No interdigital macerations.  Toenails 1-5 b/l thick, discolored, elongated with subungual debris and pain on dorsal palpation.    No corns, calluses nor porokeratotic lesions noted.  Musculoskeletal Examination: Muscle strength 5/5 to b/l LE. HAV with bunion deformity noted b/l LE. Hammertoe deformity  noted 2-5 b/l. Utilizes cane for ambulation assistance.  Radiographs: None  Last A1c:      Latest Ref Rng & Units 07/17/2022    9:35 AM 01/12/2022    9:48 AM  Hemoglobin A1C  Hemoglobin-A1c 4.6 - 6.5 % 6.7  6.9    Assessment/Plan: 1. Pain due to onychomycosis of nail   2. Diabetic peripheral neuropathy associated with type 2 diabetes mellitus (HCC)    -Patient was evaluated and treated. All patient's and/or POA's questions/concerns answered on today's visit. -Continue foot and shoe inspections daily. Monitor blood glucose per PCP/Endocrinologist's recommendations. -Continue supportive shoe gear daily. -Mycotic toenails 1-5 bilaterally were debrided in length and girth with sterile nail nippers and dremel without incident. -Patient/POA to call should there be question/concern in the interim.   Return in about 3 months (around 02/14/2023).  Freddie Breech, DPM

## 2022-11-23 ENCOUNTER — Ambulatory Visit (INDEPENDENT_AMBULATORY_CARE_PROVIDER_SITE_OTHER): Payer: Medicare Other | Admitting: Family

## 2022-11-23 ENCOUNTER — Encounter: Payer: Self-pay | Admitting: Family

## 2022-11-23 VITALS — BP 138/76 | HR 76 | Ht 64.0 in | Wt 205.2 lb

## 2022-11-23 DIAGNOSIS — M25561 Pain in right knee: Secondary | ICD-10-CM | POA: Diagnosis not present

## 2022-11-23 DIAGNOSIS — M25562 Pain in left knee: Secondary | ICD-10-CM | POA: Diagnosis not present

## 2022-11-23 DIAGNOSIS — G8929 Other chronic pain: Secondary | ICD-10-CM

## 2022-11-23 MED ORDER — PREDNISONE 20 MG PO TABS: 20.0000 mg | ORAL_TABLET | Freq: Every day | ORAL | 0 refills | Status: DC

## 2022-11-23 NOTE — Progress Notes (Signed)
Mariah Mason is a 72 y.o. female with the following history as recorded in EpicCare:  Patient Active Problem List   Diagnosis Date Noted   Bilateral lumbar radiculopathy 08/22/2020   DM type 2 with diabetic peripheral neuropathy (HCC) 08/02/2020   Gait abnormality 08/02/2020   Gout    Hypertension    Type 2 diabetes mellitus (HCC) 02/18/2019   AKI (acute kidney injury) (HCC) 02/18/2019   Hyperkalemia 02/18/2019   Metabolic acidosis 02/18/2019   Hypoglycemia 02/17/2019   Long term current use of oral hypoglycemic drug 05/23/2017   Presbyopia of both eyes 05/23/2017   Vitreous floaters of both eyes 05/23/2017   Keratoconjunctivitis sicca of both eyes not specified as Sjogren's 10/16/2016   Nuclear sclerotic cataract of both eyes 10/16/2016    Current Outpatient Medications  Medication Sig Dispense Refill   ACCU-CHEK GUIDE test strip TEST BLOOD SUGAR ONCE DAILY TEST BLOOD SUGAR up to TWICE DAILY 100 each 3   Accu-Chek Softclix Lancets lancets 4 (four) times daily.     allopurinol (ZYLOPRIM) 100 MG tablet TAKE 1 TABLET(100 MG) BY MOUTH DAILY 90 tablet 3   amLODipine (NORVASC) 10 MG tablet TAKE 1 TABLET(10 MG) BY MOUTH DAILY 90 tablet 3   ammonium lactate (LAC-HYDRIN) 12 % lotion Apply 1 Application topically as needed for dry skin. 400 g 5   atorvastatin (LIPITOR) 20 MG tablet TAKE 1 TABLET(20 MG) BY MOUTH DAILY 90 tablet 3   blood glucose meter kit and supplies KIT Dispense based on patient and insurance preference. Use up to four times daily as directed. (FOR ICD-9 250.00, 250.01). 1 each 0   carvedilol (COREG) 25 MG tablet TAKE 1 TABLET(25 MG) BY MOUTH TWICE DAILY WITH A MEAL 180 tablet 3   cholecalciferol (VITAMIN D3) 25 MCG (1000 UT) tablet Take 1,000 Units by mouth daily.     cloNIDine (CATAPRES) 0.1 MG tablet TAKE 1 TABLET(0.1 MG) BY MOUTH EVERY EVENING 90 tablet 1   colchicine 0.6 MG tablet Take 2 tablets (1.2mg ) by mouth at onset for gout, may repeat 1 tablet 0.6mg  in 2 hours  is symptoms persist 30 tablet 1   cycloSPORINE (RESTASIS) 0.05 % ophthalmic emulsion Place 1 drop into both eyes 2 (two) times daily.      dapagliflozin propanediol (FARXIGA) 5 MG TABS tablet Take 1 tablet (5 mg total) by mouth daily. 90 tablet 1   Elastic Bandages & Supports (MEDICAL COMPRESSION SOCKS) MISC Compression socks for DM peripheral neuropathy and bilateral leg swelling 2 each 3   JANUVIA 50 MG tablet TAKE 1 TABLET(50 MG) BY MOUTH DAILY 90 tablet 1   Misc. Devices MISC Shower chair. Dx- Diabetes mellitus 1 each 0   Multiple Vitamins-Minerals (CENTRUM SILVER ADULT 50+ PO) Take 1 tablet by mouth daily.     NONFORMULARY OR COMPOUNDED ITEM Peripheral Neuropathy Cream: Bupivacaine 1%, Doxepin 3%, Gabapentin 6%, Pentoxifylline 3%, Topiramate 1% Order faxed to [facility] 1 each 3   predniSONE (DELTASONE) 20 MG tablet Take 1 tablet (20 mg total) by mouth daily with breakfast. 5 tablet 0   valsartan (DIOVAN) 40 MG tablet Take 1 tablet (40 mg total) by mouth daily. PT PREFERS [facility] MANUFACTURER 90 tablet 1   No current facility-administered medications for this visit.    Allergies: Hydrochlorothiazide, Erythromycin, and Hydralazine  Past Medical History:  Diagnosis Date   Diabetes mellitus without complication (HCC)    Gout    Hyperlipemia    Hypertension    Neuropathy     Past Surgical  History:  Procedure Laterality Date   CYST EXCISION N/A 08/30/2020   Procedure: EXCISION 1X2 cm LEFT NECK SEBACEOUS CYST EXCISION 2X2 CM RIGHT BACK SEBACEOUS CYST;  Surgeon: Emelia Loron, MD;  Location: [facility];  Service: General;  Laterality: N/A;   THYROID SURGERY      Family History  Problem Relation Age of Onset   Heart attack Mother    Kidney failure Father     Social History   Tobacco Use   Smoking status: Never   Smokeless tobacco: Never  Substance Use Topics   Alcohol use: Never    Subjective:   Concerned about bilateral knee pain x 2 months; cannot  take NSAIDs due to CKD; limited benefit with Tylenol; notes that pain more noticeable after sitting for extended periods of time;   Objective:  Vitals:   11/23/22 0819  BP: 138/76  Pulse: 76  SpO2: 99%  Weight: 205 lb 3.2 oz (93.1 kg)  Height: 5\' 4"  (1.626 m)    General: Well developed, well nourished, in no acute distress  Skin : Warm and dry.  Head: Normocephalic and atraumatic  Lungs: Respirations unlabored; clear to auscultation bilaterally without wheeze, rales, rhonchi  Musculoskeletal: No deformities; crepitus noted bilaterally;   Extremities: No edema, cyanosis, clubbing  Vessels: Symmetric bilaterally  Neurologic: Alert and oriented; speech intact; face symmetrical; moves all extremities well; uses cane for stability;  Assessment:  1. Chronic pain of both knees     Plan:  Referral to orthopedics; trial of prednisone 20 mg every day x 5 days;   No follow-ups on file.  Orders Placed This Encounter  Procedures   Ambulatory referral to Orthopedics    Referral Priority:   Routine    Referral Type:   Consultation    Number of Visits Requested:   1    Requested Prescriptions   Signed Prescriptions Disp Refills   predniSONE (DELTASONE) 20 MG tablet 5 tablet 0    Sig: Take 1 tablet (20 mg total) by mouth daily with breakfast.

## 2022-11-28 LAB — HM DIABETES EYE EXAM

## 2022-12-04 ENCOUNTER — Ambulatory Visit (INDEPENDENT_AMBULATORY_CARE_PROVIDER_SITE_OTHER): Payer: Medicare Other | Admitting: Physician Assistant

## 2022-12-04 ENCOUNTER — Other Ambulatory Visit (INDEPENDENT_AMBULATORY_CARE_PROVIDER_SITE_OTHER): Payer: Medicare Other

## 2022-12-04 ENCOUNTER — Encounter: Payer: Self-pay | Admitting: Physician Assistant

## 2022-12-04 DIAGNOSIS — M1712 Unilateral primary osteoarthritis, left knee: Secondary | ICD-10-CM

## 2022-12-04 DIAGNOSIS — M25561 Pain in right knee: Secondary | ICD-10-CM

## 2022-12-04 DIAGNOSIS — M170 Bilateral primary osteoarthritis of knee: Secondary | ICD-10-CM | POA: Insufficient documentation

## 2022-12-04 DIAGNOSIS — M1711 Unilateral primary osteoarthritis, right knee: Secondary | ICD-10-CM | POA: Diagnosis not present

## 2022-12-04 DIAGNOSIS — M25562 Pain in left knee: Secondary | ICD-10-CM

## 2022-12-04 MED ORDER — LIDOCAINE HCL 1 % IJ SOLN
2.0000 mL | INTRAMUSCULAR | Status: AC | PRN
  Administered 2022-12-04: 2 mL

## 2022-12-04 MED ORDER — BUPIVACAINE HCL 0.25 % IJ SOLN
2.0000 mL | INTRAMUSCULAR | Status: AC | PRN
  Administered 2022-12-04: 2 mL via INTRA_ARTICULAR

## 2022-12-04 MED ORDER — METHYLPREDNISOLONE ACETATE 40 MG/ML IJ SUSP
40.0000 mg | INTRAMUSCULAR | Status: AC | PRN
  Administered 2022-12-04: 40 mg via INTRA_ARTICULAR

## 2022-12-04 NOTE — Progress Notes (Signed)
Office Visit Note   Patient: Mariah Mason           Date of Birth: 03-21-1951           MRN: [id number] Visit Date: 12/04/2022              Requested by: Olive Bass, FNP [address] [place],  [place] [address] PCP: Olive Bass, FNP   Assessment & Plan: Visit Diagnoses:  1. Acute pain of both knees   2. Primary osteoarthritis of both knees     Plan: Pleasant 72 year old woman with a history of on and off bilateral knee pain right greater than left.  She denies any injuries.  She denies any fever or chills.  She does have significant valgus arthritis of both knees.  She does wear a knee sleeve on the right side.  She has chronic kidney disease and cannot take anti-inflammatories.  She is a diabetic but most recent A1c was 6.7.  We discussed her options.  Including physical therapy topical anti-inflammatories knee replacement and another injection both cortisone and/or gel injections.  She did have long-term relief from a previous steroid injection I suggested this might be a good place to start.  She understands it may alter her blood sugars a little bit may follow-up with me as needed  Follow-Up Instructions: Return if symptoms worsen or fail to improve.   Orders:  Orders Placed This Encounter  Procedures  . XR Knee 1-2 Views Left  . XR Knee 1-2 Views Right   No orders of the defined types were placed in this encounter.     Procedures: Large Joint Inj: R knee on 12/04/2022 4:08 PM Indications: pain and diagnostic evaluation Details: 25 G 1.5 in needle, anteromedial approach  Arthrogram: No  Medications: 40 mg methylPREDNISolone acetate 40 MG/ML; 2 mL lidocaine 1 %; 2 mL bupivacaine 0.25 % Outcome: tolerated well, no immediate complications Procedure, treatment alternatives, risks and benefits explained, specific risks discussed. Consent was given by the patient.     Clinical Data: No additional findings.   Subjective: Chief  Complaint  Patient presents with  . Right Knee - Pain  . Left Knee - Pain    HPI pleasant 72 year old woman accompanied by her daughter today.  She has ongoing problems with her knees right greater than left.  She does ambulate fairly well with a cane.  Rates her pain as moderate.  Denies any recent change in activity cannot take nonsteroidal anti-inflammatories because of chronic kidney disease.  She is a diabetic but fairly well-controlled with hemoglobin A1c at 6.7  Review of Systems  All other systems reviewed and are negative.    Objective: Vital Signs: There were no vitals taken for this visit.  Physical Exam Constitutional:      Appearance: Normal appearance.  Pulmonary:     Effort: Pulmonary effort is normal.  Skin:    General: Skin is warm and dry.  Neurological:     General: No focal deficit present.     Mental Status: She is alert and oriented to person, place, and time.    Ortho Exam Right knee no effusion no erythema compartments are soft and compressible she has obvious clinical valgus alignment.  Positive grinding with range of motion neurovascularly intact Specialty Comments:  No specialty comments available.  Imaging: XR Knee 1-2 Views Right  Result Date: 12/04/2022 Radiographs of her right knee were obtained today she has tricompartmental end-stage valgus arthritis of her  right knee with almost bone-on-bone contact lateral and patellofemoral compartment sclerotic changes and periarticular osteophytes  XR Knee 1-2 Views Left  Result Date: 12/04/2022 Radiographs of her left knee were obtained today.  She has advanced end-stage tricompartmental valgus arthritis of the left knee with bone-on-bone contact laterally.  She has sclerotic changes and periarticular osteophytes    PMFS History: Patient Active Problem List   Diagnosis Date Noted  . Osteoarthritis of knees, bilateral 12/04/2022  . Bilateral lumbar radiculopathy 08/22/2020  . DM type 2 with diabetic  peripheral neuropathy (HCC) 08/02/2020  . Gait abnormality 08/02/2020  . Gout   . Hypertension   . Type 2 diabetes mellitus (HCC) 02/18/2019  . AKI (acute kidney injury) (HCC) 02/18/2019  . Hyperkalemia 02/18/2019  . Metabolic acidosis 02/18/2019  . Hypoglycemia 02/17/2019  . Long term current use of oral hypoglycemic drug 05/23/2017  . Presbyopia of both eyes 05/23/2017  . Vitreous floaters of both eyes 05/23/2017  . Keratoconjunctivitis sicca of both eyes not specified as Sjogren's 10/16/2016  . Nuclear sclerotic cataract of both eyes 10/16/2016   Past Medical History:  Diagnosis Date  . Diabetes mellitus without complication (HCC)   . Gout   . Hyperlipemia   . Hypertension   . Neuropathy     Family History  Problem Relation Age of Onset  . Heart attack Mother   . Kidney failure Father     Past Surgical History:  Procedure Laterality Date  . CYST EXCISION N/A 08/30/2020   Procedure: EXCISION 1X2 cm LEFT NECK SEBACEOUS CYST EXCISION 2X2 CM RIGHT BACK SEBACEOUS CYST;  Surgeon: Emelia Loron, MD;  Location: [facility];  Service: General;  Laterality: N/A;  . THYROID SURGERY     Social History   Occupational History  . Occupation: Retired  Tobacco Use  . Smoking status: Never  . Smokeless tobacco: Never  Vaping Use  . Vaping status: Never Used  Substance and Sexual Activity  . Alcohol use: Never  . Drug use: Never  . Sexual activity: Not on file

## 2022-12-05 ENCOUNTER — Other Ambulatory Visit: Payer: Self-pay | Admitting: Family

## 2022-12-05 DIAGNOSIS — M1A00X0 Idiopathic chronic gout, unspecified site, without tophus (tophi): Secondary | ICD-10-CM

## 2022-12-13 ENCOUNTER — Encounter (INDEPENDENT_AMBULATORY_CARE_PROVIDER_SITE_OTHER): Payer: Self-pay

## 2022-12-20 ENCOUNTER — Telehealth: Payer: Self-pay | Admitting: Family

## 2022-12-20 NOTE — Telephone Encounter (Signed)
Copied from CRM [id number]. Topic: Medicare AWV >> Dec 20, 2022  1:45 PM Payton Doughty wrote: Reason for CRM: LM 12/20/22 to R/S AWV appt - [facility]  Verlee Rossetti; Care Guide Ambulatory Clinical Support [place] [facility] Direct Dial: [phone]

## 2022-12-21 ENCOUNTER — Encounter: Payer: Self-pay | Admitting: Physician Assistant

## 2022-12-21 ENCOUNTER — Ambulatory Visit (INDEPENDENT_AMBULATORY_CARE_PROVIDER_SITE_OTHER): Payer: Medicare Other | Admitting: Physician Assistant

## 2022-12-21 DIAGNOSIS — M1712 Unilateral primary osteoarthritis, left knee: Secondary | ICD-10-CM

## 2022-12-21 MED ORDER — LIDOCAINE HCL 1 % IJ SOLN
2.0000 mL | INTRAMUSCULAR | Status: AC | PRN
  Administered 2022-12-21: 2 mL

## 2022-12-21 MED ORDER — METHYLPREDNISOLONE ACETATE 40 MG/ML IJ SUSP
40.0000 mg | INTRAMUSCULAR | Status: AC | PRN
  Administered 2022-12-21: 40 mg via INTRA_ARTICULAR

## 2022-12-21 MED ORDER — BUPIVACAINE HCL 0.25 % IJ SOLN
2.0000 mL | INTRAMUSCULAR | Status: AC | PRN
  Administered 2022-12-21: 2 mL via INTRA_ARTICULAR

## 2022-12-21 NOTE — Progress Notes (Signed)
Office Visit Note   Patient: Mariah Mason           Date of Birth: 04-Apr-1951           MRN: [id number] Visit Date: 12/21/2022              Requested by: Olive Bass, FNP [address] [place],  [place] [address] PCP: Olive Bass, FNP  Chief Complaint  Patient presents with  . Left Knee - Follow-up      HPI: Patient is a pleasant 72 year old woman with a history of advanced tricompartmental valgus arthritis.  Bilateral knees.  She also has a history of diabetes.  I injected her right knee a few weeks ago and she has gotten good relief.  Comes in today requesting an injection into her left knee no injury describes her pain as moderate  Assessment & Plan: Visit Diagnoses: Osteoarthritis left knee  Plan: Micah Flesher forward with an injection today may follow-up as needed  Follow-Up Instructions: No follow-ups on file.   Ortho Exam  Patient is alert, oriented, no adenopathy, well-dressed, normal affect, normal respiratory effort. Left knee no erythema no effusion mild soft tissue swelling compartments are soft and compressible global tenderness throughout the knee she has negative Denna Haggard' sign neurovascular intact distally  Imaging: No results found. No images are attached to the encounter.  Labs: Lab Results  Component Value Date   HGBA1C 6.7 (H) 07/17/2022   HGBA1C 6.9 (H) 01/12/2022   HGBA1C 7.3 (H) 09/26/2021   ESRSEDRATE 51 (H) 08/02/2020   CRP 2 08/02/2020     Lab Results  Component Value Date   ALBUMIN 4.2 07/17/2022   ALBUMIN 3.8 04/07/2022   ALBUMIN 4.1 01/12/2022    Lab Results  Component Value Date   MG 2.3 04/07/2022   No results found for: "VD25OH"  No results found for: "PREALBUMIN"    Latest Ref Rng & Units 04/07/2022    8:30 PM 10/10/2021    1:52 AM 10/02/2021    4:52 PM  CBC EXTENDED  WBC 4.0 - 10.5 K/uL 6.4  6.6  6.5   RBC 3.87 - 5.11 MIL/uL 4.02  4.35  4.45   Hemoglobin 12.0 - 15.0 g/dL 19.1  47.8   29.5   HCT 36.0 - 46.0 % 33.9  35.8  36.2   Platelets 150 - 400 K/uL 244  237  234   NEUT# 1.7 - 7.7 K/uL 3.9  4.1    Lymph# 0.7 - 4.0 K/uL 1.9  1.8       There is no height or weight on file to calculate BMI.  Orders:  No orders of the defined types were placed in this encounter.  No orders of the defined types were placed in this encounter.    Procedures: Large Joint Inj: L knee on 12/21/2022 8:39 AM Indications: pain and diagnostic evaluation Details: 25 G 1.5 in needle, anteromedial approach  Arthrogram: No  Medications: 40 mg methylPREDNISolone acetate 40 MG/ML; 2 mL bupivacaine 0.25 %; 2 mL lidocaine 1 % Outcome: tolerated well, no immediate complications Procedure, treatment alternatives, risks and benefits explained, specific risks discussed. Consent was given by the patient.    Clinical Data: No additional findings.  ROS:  All other systems negative, except as noted in the HPI. Review of Systems  Objective: Vital Signs: There were no vitals taken for this visit.  Specialty Comments:  No specialty comments available.  PMFS History: Patient Active Problem List  Diagnosis Date Noted  . Osteoarthritis of knees, bilateral 12/04/2022  . Bilateral lumbar radiculopathy 08/22/2020  . DM type 2 with diabetic peripheral neuropathy (HCC) 08/02/2020  . Gait abnormality 08/02/2020  . Gout   . Hypertension   . Type 2 diabetes mellitus (HCC) 02/18/2019  . AKI (acute kidney injury) (HCC) 02/18/2019  . Hyperkalemia 02/18/2019  . Metabolic acidosis 02/18/2019  . Hypoglycemia 02/17/2019  . Long term current use of oral hypoglycemic drug 05/23/2017  . Presbyopia of both eyes 05/23/2017  . Vitreous floaters of both eyes 05/23/2017  . Keratoconjunctivitis sicca of both eyes not specified as Sjogren's 10/16/2016  . Nuclear sclerotic cataract of both eyes 10/16/2016   Past Medical History:  Diagnosis Date  . Diabetes mellitus without complication (HCC)   . Gout   .  Hyperlipemia   . Hypertension   . Neuropathy     Family History  Problem Relation Age of Onset  . Heart attack Mother   . Kidney failure Father     Past Surgical History:  Procedure Laterality Date  . CYST EXCISION N/A 08/30/2020   Procedure: EXCISION 1X2 cm LEFT NECK SEBACEOUS CYST EXCISION 2X2 CM RIGHT BACK SEBACEOUS CYST;  Surgeon: Emelia Loron, MD;  Location: [facility];  Service: General;  Laterality: N/A;  . THYROID SURGERY     Social History   Occupational History  . Occupation: Retired  Tobacco Use  . Smoking status: Never  . Smokeless tobacco: Never  Vaping Use  . Vaping status: Never Used  Substance and Sexual Activity  . Alcohol use: Never  . Drug use: Never  . Sexual activity: Not on file

## 2023-01-15 ENCOUNTER — Ambulatory Visit (INDEPENDENT_AMBULATORY_CARE_PROVIDER_SITE_OTHER): Payer: Medicare Other | Admitting: Family

## 2023-01-15 ENCOUNTER — Encounter: Payer: Self-pay | Admitting: Family

## 2023-01-15 VITALS — BP 136/62 | HR 95 | Resp 18 | Ht 64.0 in | Wt 201.4 lb

## 2023-01-15 DIAGNOSIS — Z7984 Long term (current) use of oral hypoglycemic drugs: Secondary | ICD-10-CM

## 2023-01-15 DIAGNOSIS — Z794 Long term (current) use of insulin: Secondary | ICD-10-CM | POA: Diagnosis not present

## 2023-01-15 DIAGNOSIS — N189 Chronic kidney disease, unspecified: Secondary | ICD-10-CM

## 2023-01-15 DIAGNOSIS — Z23 Encounter for immunization: Secondary | ICD-10-CM

## 2023-01-15 DIAGNOSIS — E1149 Type 2 diabetes mellitus with other diabetic neurological complication: Secondary | ICD-10-CM

## 2023-01-15 LAB — CBC WITH DIFFERENTIAL/PLATELET
Basophils Absolute: 0 10*3/uL (ref 0.0–0.1)
Basophils Relative: 0.7 % (ref 0.0–3.0)
Eosinophils Absolute: 0.2 10*3/uL (ref 0.0–0.7)
Eosinophils Relative: 3.9 % (ref 0.0–5.0)
HCT: 34.8 % — ABNORMAL LOW (ref 36.0–46.0)
Hemoglobin: 11.3 g/dL — ABNORMAL LOW (ref 12.0–15.0)
Lymphocytes Relative: 29.6 % (ref 12.0–46.0)
Lymphs Abs: 1.4 10*3/uL (ref 0.7–4.0)
MCHC: 32.4 g/dL (ref 30.0–36.0)
MCV: 84.2 fl (ref 78.0–100.0)
Monocytes Absolute: 0.3 10*3/uL (ref 0.1–1.0)
Monocytes Relative: 5.7 % (ref 3.0–12.0)
Neutro Abs: 2.9 10*3/uL (ref 1.4–7.7)
Neutrophils Relative %: 60.1 % (ref 43.0–77.0)
Platelets: 249 10*3/uL (ref 150.0–400.0)
RBC: 4.13 Mil/uL (ref 3.87–5.11)
RDW: 13.2 % (ref 11.5–15.5)
WBC: 4.8 10*3/uL (ref 4.0–10.5)

## 2023-01-15 LAB — COMPREHENSIVE METABOLIC PANEL
ALT: 9 U/L (ref 0–35)
AST: 11 U/L (ref 0–37)
Albumin: 4.1 g/dL (ref 3.5–5.2)
Alkaline Phosphatase: 59 U/L (ref 39–117)
BUN: 47 mg/dL — ABNORMAL HIGH (ref 6–23)
CO2: 23 meq/L (ref 19–32)
Calcium: 9.3 mg/dL (ref 8.4–10.5)
Chloride: 107 meq/L (ref 96–112)
Creatinine, Ser: 1.77 mg/dL — ABNORMAL HIGH (ref 0.40–1.20)
GFR: 28.28 mL/min — ABNORMAL LOW (ref >60.00)
Glucose, Bld: 180 mg/dL — ABNORMAL HIGH (ref 70–99)
Potassium: 4.3 meq/L (ref 3.5–5.1)
Sodium: 138 meq/L (ref 135–145)
Total Bilirubin: 0.4 mg/dL (ref 0.2–1.2)
Total Protein: 7.1 g/dL (ref 6.0–8.3)

## 2023-01-15 LAB — HEMOGLOBIN A1C: Hgb A1c MFr Bld: 6.9 % — ABNORMAL HIGH (ref 4.6–6.5)

## 2023-01-15 NOTE — Progress Notes (Signed)
Mariah Mason is a 72 y.o. female with the following history as recorded in EpicCare:  Patient Active Problem List   Diagnosis Date Noted   Osteoarthritis of knees, bilateral 12/04/2022   Bilateral lumbar radiculopathy 08/22/2020   DM type 2 with diabetic peripheral neuropathy (HCC) 08/02/2020   Gait abnormality 08/02/2020   Gout    Hypertension    Type 2 diabetes mellitus (HCC) 02/18/2019   AKI (acute kidney injury) (HCC) 02/18/2019   Hyperkalemia 02/18/2019   Metabolic acidosis 02/18/2019   Hypoglycemia 02/17/2019   Long term current use of oral hypoglycemic drug 05/23/2017   Presbyopia of both eyes 05/23/2017   Vitreous floaters of both eyes 05/23/2017   Keratoconjunctivitis sicca of both eyes not specified as Sjogren's 10/16/2016   Nuclear sclerotic cataract of both eyes 10/16/2016    Current Outpatient Medications  Medication Sig Dispense Refill   ACCU-CHEK GUIDE test strip TEST BLOOD SUGAR ONCE DAILY TEST BLOOD SUGAR up to TWICE DAILY 100 each 3   Accu-Chek Softclix Lancets lancets 4 (four) times daily.     allopurinol (ZYLOPRIM) 100 MG tablet TAKE 1 TABLET(100 MG) BY MOUTH DAILY 90 tablet 3   amLODipine (NORVASC) 10 MG tablet TAKE 1 TABLET(10 MG) BY MOUTH DAILY 90 tablet 3   ammonium lactate (LAC-HYDRIN) 12 % lotion Apply 1 Application topically as needed for dry skin. 400 g 5   atorvastatin (LIPITOR) 20 MG tablet TAKE 1 TABLET(20 MG) BY MOUTH DAILY 90 tablet 3   blood glucose meter kit and supplies KIT Dispense based on patient and insurance preference. Use up to four times daily as directed. (FOR ICD-9 250.00, 250.01). 1 each 0   carvedilol (COREG) 25 MG tablet TAKE 1 TABLET(25 MG) BY MOUTH TWICE DAILY WITH A MEAL 180 tablet 3   cholecalciferol (VITAMIN D3) 25 MCG (1000 UT) tablet Take 1,000 Units by mouth daily.     cloNIDine (CATAPRES) 0.1 MG tablet TAKE 1 TABLET(0.1 MG) BY MOUTH EVERY EVENING 90 tablet 1   colchicine 0.6 MG tablet Take 2 tablets (1.2mg ) by mouth at onset  for gout, may repeat 1 tablet 0.6mg  in 2 hours is symptoms persist 30 tablet 1   cycloSPORINE (RESTASIS) 0.05 % ophthalmic emulsion Place 1 drop into both eyes 2 (two) times daily.      dapagliflozin propanediol (FARXIGA) 5 MG TABS tablet Take 1 tablet (5 mg total) by mouth daily. 90 tablet 1   Elastic Bandages & Supports (MEDICAL COMPRESSION SOCKS) MISC Compression socks for DM peripheral neuropathy and bilateral leg swelling 2 each 3   JANUVIA 50 MG tablet TAKE 1 TABLET(50 MG) BY MOUTH DAILY 90 tablet 1   Misc. Devices MISC Shower chair. Dx- Diabetes mellitus 1 each 0   Multiple Vitamins-Minerals (CENTRUM SILVER ADULT 50+ PO) Take 1 tablet by mouth daily.     NONFORMULARY OR COMPOUNDED ITEM Peripheral Neuropathy Cream: Bupivacaine 1%, Doxepin 3%, Gabapentin 6%, Pentoxifylline 3%, Topiramate 1% Order faxed to [facility] 1 each 3   predniSONE (DELTASONE) 20 MG tablet Take 1 tablet (20 mg total) by mouth daily with breakfast. 5 tablet 0   valsartan (DIOVAN) 40 MG tablet Take 1 tablet (40 mg total) by mouth daily. PT PREFERS [facility] MANUFACTURER 90 tablet 1   No current facility-administered medications for this visit.    Allergies: Hydrochlorothiazide, Erythromycin, and Hydralazine  Past Medical History:  Diagnosis Date   Diabetes mellitus without complication (HCC)    Gout    Hyperlipemia    Hypertension  Neuropathy     Past Surgical History:  Procedure Laterality Date   CYST EXCISION N/A 08/30/2020   Procedure: EXCISION 1X2 cm LEFT NECK SEBACEOUS CYST EXCISION 2X2 CM RIGHT BACK SEBACEOUS CYST;  Surgeon: Emelia Loron, MD;  Location: [facility];  Service: General;  Laterality: N/A;   THYROID SURGERY      Family History  Problem Relation Age of Onset   Heart attack Mother    Kidney failure Father     Social History   Tobacco Use   Smoking status: Never   Smokeless tobacco: Never  Substance Use Topics   Alcohol use: Never    Subjective:   Patient  presents for 6 month follow up; accompanied by daughter; is continuing to work with nephrology; "doing very well today- no acute concerns."  Objective:  Vitals:   01/15/23 1003  BP: 136/62  Pulse: 95  Resp: 18  SpO2: 97%  Weight: 201 lb 6.4 oz (91.4 kg)  Height: 5\' 4"  (1.626 m)    General: Well developed, well nourished, in no acute distress  Skin : Warm and dry.  Head: Normocephalic and atraumatic  Eyes: Sclera and conjunctiva clear; pupils round and reactive to light; extraocular movements intact  Ears: External normal; canals clear; tympanic membranes normal  Oropharynx: Pink, supple. No suspicious lesions  Neck: Supple without thyromegaly, adenopathy  Lungs: Respirations unlabored; clear to auscultation bilaterally without wheeze, rales, rhonchi  CVS exam: normal rate and regular rhythm.  Neurologic: Alert and oriented; speech intact; face symmetrical; moves all extremities well; CNII-XII intact without focal deficit   Assessment:  1. Type 2 diabetes mellitus with other neurologic complication, with long-term current use of insulin (HCC)   2. Long term current use of oral hypoglycemic drug   3. Chronic kidney disease, unspecified CKD stage     Plan:  Doing well today- no acute concerns; update labs today;  Flu shot updated;  Keep planned visit with nephrology for next month;  Follow up here in 6 months, sooner prn.  Return in about 6 months (around 07/15/2023).  Orders Placed This Encounter  Procedures   CBC with Differential/Platelet   Comp Met (CMET)   Hemoglobin A1c   HM DIABETES EYE EXAM    This external order was created through the Results Console.    Requested Prescriptions    No prescriptions requested or ordered in this encounter

## 2023-01-15 NOTE — Addendum Note (Signed)
Addended by: Judieth Keens on: 01/15/2023 03:43 PM   Modules accepted: Orders

## 2023-01-18 LAB — HM MAMMOGRAPHY

## 2023-01-19 ENCOUNTER — Encounter: Payer: Self-pay | Admitting: Family

## 2023-01-21 ENCOUNTER — Encounter: Payer: Self-pay | Admitting: Family

## 2023-01-24 ENCOUNTER — Other Ambulatory Visit: Payer: Self-pay | Admitting: Family

## 2023-01-29 ENCOUNTER — Encounter: Payer: Self-pay | Admitting: Family

## 2023-02-05 ENCOUNTER — Other Ambulatory Visit: Payer: Self-pay | Admitting: Family

## 2023-02-27 ENCOUNTER — Ambulatory Visit: Payer: Medicare Other | Admitting: Podiatry

## 2023-03-01 ENCOUNTER — Other Ambulatory Visit: Payer: Self-pay | Admitting: Family

## 2023-03-01 DIAGNOSIS — I10 Essential (primary) hypertension: Secondary | ICD-10-CM

## 2023-03-01 DIAGNOSIS — Z794 Long term (current) use of insulin: Secondary | ICD-10-CM

## 2023-03-07 ENCOUNTER — Ambulatory Visit: Payer: Medicare Other | Admitting: Physician Assistant

## 2023-03-07 ENCOUNTER — Encounter: Payer: Self-pay | Admitting: Physician Assistant

## 2023-03-07 DIAGNOSIS — M1711 Unilateral primary osteoarthritis, right knee: Secondary | ICD-10-CM

## 2023-03-07 MED ORDER — METHYLPREDNISOLONE ACETATE 40 MG/ML IJ SUSP
40.0000 mg | INTRAMUSCULAR | Status: AC | PRN
Start: 2023-03-07 — End: 2023-03-07
  Administered 2023-03-07: 40 mg via INTRA_ARTICULAR

## 2023-03-07 MED ORDER — LIDOCAINE HCL 1 % IJ SOLN
3.0000 mL | INTRAMUSCULAR | Status: AC | PRN
Start: 2023-03-07 — End: 2023-03-07
  Administered 2023-03-07: 3 mL

## 2023-03-07 NOTE — Progress Notes (Signed)
Office Visit Note   Patient: Mariah Mason           Date of Birth: [birth year]           MRN: [id number] Visit Date: 03/07/2023              Requested by: Olive Bass, FNP [address] [place],  [place] [address] PCP: Olive Bass, FNP  Chief Complaint  Patient presents with  . Right Knee - Pain      HPI: Patient is a pleasant 72 year old woman with a history of advanced tricompartmental valgus arthritis.  She periodically gets an injection.  Her right knee is bothering her today.  No new injury.  Rates her pain is moderate.  She does get about 3 months relief from the injection  Assessment & Plan: Visit Diagnoses: Osteoarthritis right knee  Plan: Will go forward with an injection today may follow-up as needed  Follow-Up Instructions: No follow-ups on file.   Ortho Exam  Patient is alert, oriented, no adenopathy, well-dressed, normal affect, normal respiratory effort. Right knee no erythema no effusion compartments are soft and compressible she is neurovascularly intact significant valgus malalignment  Imaging: No results found. No images are attached to the encounter.  Labs: Lab Results  Component Value Date   HGBA1C 6.9 (H) 01/15/2023   HGBA1C 6.7 (H) 07/17/2022   HGBA1C 6.9 (H) 01/12/2022   ESRSEDRATE 51 (H) 08/02/2020   CRP 2 08/02/2020     Lab Results  Component Value Date   ALBUMIN 4.1 01/15/2023   ALBUMIN 4.2 07/17/2022   ALBUMIN 3.8 04/07/2022    Lab Results  Component Value Date   MG 2.3 04/07/2022   No results found for: "VD25OH"  No results found for: "PREALBUMIN"    Latest Ref Rng & Units 01/15/2023   10:55 AM 04/07/2022    8:30 PM 10/10/2021    1:52 AM  CBC EXTENDED  WBC 4.0 - 10.5 K/uL 4.8  6.4  6.6   RBC 3.87 - 5.11 Mil/uL 4.13  4.02  4.35   Hemoglobin 12.0 - 15.0 g/dL 07.3  71.0  62.6   HCT 36.0 - 46.0 % 34.8  33.9  35.8   Platelets 150.0 - 400.0 K/uL 249.0  244  237   NEUT# 1.4 - 7.7 K/uL  2.9  3.9  4.1   Lymph# 0.7 - 4.0 K/uL 1.4  1.9  1.8      There is no height or weight on file to calculate BMI.  Orders:  No orders of the defined types were placed in this encounter.  No orders of the defined types were placed in this encounter.    Procedures: Large Joint Inj: R knee on 03/07/2023 8:54 AM Indications: pain and diagnostic evaluation Details: 25 G 1.5 in needle, anteromedial approach  Arthrogram: No  Medications: 40 mg methylPREDNISolone acetate 40 MG/ML; 3 mL lidocaine 1 % Outcome: tolerated well, no immediate complications Procedure, treatment alternatives, risks and benefits explained, specific risks discussed. Consent was given by the patient.    Clinical Data: No additional findings.  ROS:  All other systems negative, except as noted in the HPI. Review of Systems  Objective: Vital Signs: There were no vitals taken for this visit.  Specialty Comments:  No specialty comments available.  PMFS History: Patient Active Problem List   Diagnosis Date Noted  . Osteoarthritis of knees, bilateral 12/04/2022  . Bilateral lumbar radiculopathy 08/22/2020  . DM type 2 with diabetic  peripheral neuropathy (HCC) 08/02/2020  . Gait abnormality 08/02/2020  . Gout   . Hypertension   . Type 2 diabetes mellitus (HCC) 02/18/2019  . AKI (acute kidney injury) (HCC) 02/18/2019  . Hyperkalemia 02/18/2019  . Metabolic acidosis 02/18/2019  . Hypoglycemia 02/17/2019  . Long term current use of oral hypoglycemic drug 05/23/2017  . Presbyopia of both eyes 05/23/2017  . Vitreous floaters of both eyes 05/23/2017  . Keratoconjunctivitis sicca of both eyes not specified as Sjogren's 10/16/2016  . Nuclear sclerotic cataract of both eyes 10/16/2016   Past Medical History:  Diagnosis Date  . Diabetes mellitus without complication (HCC)   . Gout   . Hyperlipemia   . Hypertension   . Neuropathy     Family History  Problem Relation Age of Onset  . Heart attack Mother    . Kidney failure Father     Past Surgical History:  Procedure Laterality Date  . CYST EXCISION N/A 08/30/2020   Procedure: EXCISION 1X2 cm LEFT NECK SEBACEOUS CYST EXCISION 2X2 CM RIGHT BACK SEBACEOUS CYST;  Surgeon: Emelia Loron, MD;  Location: [facility];  Service: General;  Laterality: N/A;  . THYROID SURGERY     Social History   Occupational History  . Occupation: Retired  Tobacco Use  . Smoking status: Never  . Smokeless tobacco: Never  Vaping Use  . Vaping status: Never Used  Substance and Sexual Activity  . Alcohol use: Never  . Drug use: Never  . Sexual activity: Not on file

## 2023-05-06 ENCOUNTER — Other Ambulatory Visit: Payer: Self-pay | Admitting: Family

## 2023-05-06 DIAGNOSIS — E1122 Type 2 diabetes mellitus with diabetic chronic kidney disease: Secondary | ICD-10-CM

## 2023-05-17 ENCOUNTER — Ambulatory Visit: Payer: Medicare Other | Admitting: Family

## 2023-05-30 ENCOUNTER — Other Ambulatory Visit: Payer: Self-pay | Admitting: Family

## 2023-05-30 DIAGNOSIS — I10 Essential (primary) hypertension: Secondary | ICD-10-CM

## 2023-05-30 DIAGNOSIS — E1169 Type 2 diabetes mellitus with other specified complication: Secondary | ICD-10-CM

## 2023-06-03 ENCOUNTER — Other Ambulatory Visit: Payer: Self-pay | Admitting: Podiatry

## 2023-06-03 DIAGNOSIS — L853 Xerosis cutis: Secondary | ICD-10-CM

## 2023-06-03 NOTE — Telephone Encounter (Signed)
Refill ammonium lactate lotion.

## 2023-06-04 ENCOUNTER — Encounter: Payer: Self-pay | Admitting: Podiatry

## 2023-06-04 ENCOUNTER — Ambulatory Visit (INDEPENDENT_AMBULATORY_CARE_PROVIDER_SITE_OTHER): Payer: Medicare Other | Admitting: Podiatry

## 2023-06-04 VITALS — Ht 64.0 in | Wt 201.0 lb

## 2023-06-04 DIAGNOSIS — M2011 Hallux valgus (acquired), right foot: Secondary | ICD-10-CM | POA: Diagnosis not present

## 2023-06-04 DIAGNOSIS — E119 Type 2 diabetes mellitus without complications: Secondary | ICD-10-CM

## 2023-06-04 DIAGNOSIS — M79609 Pain in unspecified limb: Secondary | ICD-10-CM | POA: Diagnosis not present

## 2023-06-04 DIAGNOSIS — M2012 Hallux valgus (acquired), left foot: Secondary | ICD-10-CM

## 2023-06-04 DIAGNOSIS — E1142 Type 2 diabetes mellitus with diabetic polyneuropathy: Secondary | ICD-10-CM

## 2023-06-04 DIAGNOSIS — M2041 Other hammer toe(s) (acquired), right foot: Secondary | ICD-10-CM | POA: Diagnosis not present

## 2023-06-04 DIAGNOSIS — B351 Tinea unguium: Secondary | ICD-10-CM | POA: Diagnosis not present

## 2023-06-04 DIAGNOSIS — M2042 Other hammer toe(s) (acquired), left foot: Secondary | ICD-10-CM

## 2023-06-10 ENCOUNTER — Encounter: Payer: Self-pay | Admitting: Podiatry

## 2023-06-10 NOTE — Progress Notes (Signed)
 ANNUAL DIABETIC FOOT EXAM  Subjective: Mariah Mason presents today for annual diabetic foot exam.  Chief Complaint  Patient presents with   [facility]    She is here for a nail trim, last A1C was 6.1, PCP is laura woodruff,   Patient confirms h/o diabetes.  Patient denies any h/o foot wounds.  Patient has been diagnosed with neuropathy.  Jason Leita Repine, FNP is patient's PCP.  Past Medical History:  Diagnosis Date   Diabetes mellitus without complication (HCC)    Gout    Hyperlipemia    Hypertension    Neuropathy    Patient Active Problem List   Diagnosis Date Noted   Osteoarthritis of knees, bilateral 12/04/2022   Bilateral lumbar radiculopathy 08/22/2020   DM type 2 with diabetic peripheral neuropathy (HCC) 08/02/2020   Gait abnormality 08/02/2020   Gout    Hypertension    Type 2 diabetes mellitus (HCC) 02/18/2019   AKI (acute kidney injury) (HCC) 02/18/2019   Hyperkalemia 02/18/2019   Metabolic acidosis 02/18/2019   Hypoglycemia 02/17/2019   Long term current use of oral hypoglycemic drug 05/23/2017   Presbyopia of both eyes 05/23/2017   Vitreous floaters of both eyes 05/23/2017   Keratoconjunctivitis sicca of both eyes not specified as Sjogren's 10/16/2016   Nuclear sclerotic cataract of both eyes 10/16/2016   Past Surgical History:  Procedure Laterality Date   CYST EXCISION N/A 08/30/2020   Procedure: EXCISION 1X2 cm LEFT NECK SEBACEOUS CYST EXCISION 2X2 CM RIGHT BACK SEBACEOUS CYST;  Surgeon: Ebbie Cough, MD;  Location: [facility];  Service: General;  Laterality: N/A;   THYROID SURGERY     Current Outpatient Medications on File Prior to Visit  Medication Sig Dispense Refill   ACCU-CHEK GUIDE TEST test strip CHECK BLOOD SUGAR ONCE DAILY 100 strip 3   Accu-Chek Softclix Lancets lancets 4 (four) times daily.     allopurinol  (ZYLOPRIM ) 100 MG tablet TAKE 1 TABLET(100 MG) BY MOUTH DAILY 90 tablet 3   amLODipine  (NORVASC ) 10 MG tablet  TAKE 1 TABLET(10 MG) BY MOUTH DAILY 90 tablet 3   ammonium lactate  (LAC-HYDRIN ) 12 % lotion APPLY TOPICALLY TO THE SKIN DAILY AS NEEDED FOR DRY SKIN 400 g 5   atorvastatin  (LIPITOR) 20 MG tablet TAKE 1 TABLET(20 MG) BY MOUTH DAILY 90 tablet 3   blood glucose meter kit and supplies KIT Dispense based on patient and insurance preference. Use up to four times daily as directed. (FOR ICD-9 250.00, 250.01). 1 each 0   carvedilol  (COREG ) 25 MG tablet TAKE 1 TABLET(25 MG) BY MOUTH TWICE DAILY WITH A MEAL 180 tablet 3   cholecalciferol (VITAMIN D3) 25 MCG (1000 UT) tablet Take 1,000 Units by mouth daily.     cloNIDine  (CATAPRES ) 0.1 MG tablet TAKE 1 TABLET(0.1 MG) BY MOUTH EVERY EVENING 90 tablet 1   colchicine  0.6 MG tablet Take 2 tablets (1.2mg ) by mouth at onset for gout, may repeat 1 tablet 0.6mg  in 2 hours is symptoms persist 30 tablet 1   cycloSPORINE  (RESTASIS ) 0.05 % ophthalmic emulsion Place 1 drop into both eyes 2 (two) times daily.      dapagliflozin  propanediol (FARXIGA ) 5 MG TABS tablet TAKE 1 TABLET(5 MG) BY MOUTH DAILY 90 tablet 1   Elastic Bandages & Supports (MEDICAL COMPRESSION SOCKS) MISC Compression socks for DM peripheral neuropathy and bilateral leg swelling 2 each 3   JANUVIA  50 MG tablet TAKE 1 TABLET(50 MG) BY MOUTH DAILY 90 tablet 1   Misc. Devices MISC  Shower chair. Dx- Diabetes mellitus 1 each 0   Multiple Vitamins-Minerals (CENTRUM SILVER ADULT 50+ PO) Take 1 tablet by mouth daily.     NONFORMULARY OR COMPOUNDED ITEM Peripheral Neuropathy Cream: Bupivacaine  1%, Doxepin 3%, Gabapentin  6%, Pentoxifylline 3%, Topiramate 1% Order faxed to [facility] 1 each 3   predniSONE  (DELTASONE ) 20 MG tablet Take 1 tablet (20 mg total) by mouth daily with breakfast. 5 tablet 0   valsartan  (DIOVAN ) 40 MG tablet TAKE 1 TABLET(40 MG) BY MOUTH DAILY 90 tablet 1   No current facility-administered medications on file prior to visit.    Allergies  Allergen Reactions    Hydrochlorothiazide     Other Reaction(s): dizziness   Erythromycin Other (See Comments)    Does not remember reaction.   Hydralazine  Other (See Comments)    Not able to tolerate per patient; caused her to feel dizzy   Social History   Occupational History   Occupation: Retired  Tobacco Use   Smoking status: Never   Smokeless tobacco: Never  Vaping Use   Vaping status: Never Used  Substance and Sexual Activity   Alcohol use: Never   Drug use: Never   Sexual activity: Not on file   Family History  Problem Relation Age of Onset   Heart attack Mother    Kidney failure Father    Immunization History  Administered Date(s) Administered   Fluad Quad(high Dose 65+) 01/12/2022   Fluad Trivalent(High Dose 65+) 01/15/2023   PNEUMOCOCCAL CONJUGATE-20 03/28/2021   Pneumococcal Conjugate-13 08/12/2019     Review of Systems: Negative except as noted in the HPI.   Objective: There were no vitals filed for this visit.  Mariah Mason is a pleasant 73 y.o. female in NAD. AAO X 3.  Title   Diabetic Foot Exam - detailed Date & Time: 06/04/2023 10:30 AM Diabetic Foot exam was performed with the following findings: Yes  Visual Foot Exam completed.: Yes  Is there a history of foot ulcer?: No Is there a foot ulcer now?: No Is there swelling?: Yes Is there elevated skin temperature?: No Is there abnormal foot shape?: Yes (Comment: HAV with bunion deformity b/l; Hammertoes 2-5 b/l.) Is there a claw toe deformity?: No Are the toenails long?: Yes Are the toenails thick?: Yes Are the toenails ingrown?: No Is the skin thin, fragile, shiny and hairless?: No Normal Range of Motion?: Yes Is there foot or ankle muscle weakness?: No Do you have pain in calf while walking?: No Are the shoes appropriate in style and fit?: Yes Can the patient see the bottom of their feet?: No Pulse Foot Exam completed.: Yes   Right Posterior Tibialis: Present Left posterior Tibialis: Present   Right Dorsalis  Pedis: Diminished Left Dorsalis Pedis: Diminished     Sensory Foot Exam Completed.: Yes Semmes-Weinstein Monofilament Test + means has sensation and - means no sensation  R Foot Test Control: Pos L Foot Test Control: Pos   R Site 1-Great Toe: Pos L Site 1-Great Toe: Pos   R Site 4: Pos L Site 4: Pos   R site 5: Pos L Site 5: Pos  R Site 6: Pos L Site 6: Pos     Image components are not supported.   Image components are not supported. Image components are not supported.  Tuning Fork Comments Wearing compression hose     Lab Results  Component Value Date   HGBA1C 6.9 (H) 01/15/2023   ADA Risk Categorization: Low Risk :  Patient has all  of the following: Intact protective sensation No prior foot ulcer  No severe deformity Pedal pulses present  Assessment: 1. Pain due to onychomycosis of nail   2. Hallux valgus, acquired, bilateral   3. Acquired hammertoes of both feet   4. Diabetic peripheral neuropathy associated with type 2 diabetes mellitus (HCC)   5. Encounter for diabetic foot exam (HCC)     Plan: -Consent given for treatment as described below: -Examined patient. -Diabetic foot examination performed today. -Continue diabetic foot care principles: inspect feet daily, monitor glucose as recommended by PCP and/or Endocrinologist, and follow prescribed diet per PCP, Endocrinologist and/or dietician. -Patient to continue soft, supportive shoe gear daily. -Toenails 1-5 b/l were debrided in length and girth with sterile nail nippers and dremel without iatrogenic bleeding.  -Patient/POA to call should there be question/concern in the interim. Return in about 3 months (around 09/01/2023).  Delon LITTIE Merlin, DPM      [facility] LOCATION: [address], [place] [address]                   Office [phone]   [facility] LOCATION: [address] [place], [place] [address] Office [phone]

## 2023-07-19 ENCOUNTER — Other Ambulatory Visit: Payer: Self-pay | Admitting: Family

## 2023-07-19 ENCOUNTER — Encounter: Payer: Self-pay | Admitting: Family

## 2023-07-19 ENCOUNTER — Ambulatory Visit: Payer: Medicare Other | Admitting: Family

## 2023-07-19 VITALS — BP 140/72 | HR 72 | Ht 64.0 in | Wt 203.8 lb

## 2023-07-19 DIAGNOSIS — N189 Chronic kidney disease, unspecified: Secondary | ICD-10-CM

## 2023-07-19 DIAGNOSIS — I10 Essential (primary) hypertension: Secondary | ICD-10-CM

## 2023-07-19 DIAGNOSIS — Z7984 Long term (current) use of oral hypoglycemic drugs: Secondary | ICD-10-CM | POA: Diagnosis not present

## 2023-07-19 DIAGNOSIS — E119 Type 2 diabetes mellitus without complications: Secondary | ICD-10-CM

## 2023-07-19 DIAGNOSIS — E1142 Type 2 diabetes mellitus with diabetic polyneuropathy: Secondary | ICD-10-CM

## 2023-07-19 LAB — CBC WITH DIFFERENTIAL/PLATELET
Basophils Absolute: 0 10*3/uL (ref 0.0–0.1)
Basophils Relative: 0.5 % (ref 0.0–3.0)
Eosinophils Absolute: 0.1 10*3/uL (ref 0.0–0.7)
Eosinophils Relative: 2.9 % (ref 0.0–5.0)
HCT: 36.1 % (ref 36.0–46.0)
Hemoglobin: 11.5 g/dL — ABNORMAL LOW (ref 12.0–15.0)
Lymphocytes Relative: 29.7 % (ref 12.0–46.0)
Lymphs Abs: 1.5 10*3/uL (ref 0.7–4.0)
MCHC: 32 g/dL (ref 30.0–36.0)
MCV: 84.5 fl (ref 78.0–100.0)
Monocytes Absolute: 0.3 10*3/uL (ref 0.1–1.0)
Monocytes Relative: 6 % (ref 3.0–12.0)
Neutro Abs: 3.1 10*3/uL (ref 1.4–7.7)
Neutrophils Relative %: 60.9 % (ref 43.0–77.0)
Platelets: 235 10*3/uL (ref 150.0–400.0)
RBC: 4.27 Mil/uL (ref 3.87–5.11)
RDW: 13.6 % (ref 11.5–15.5)
WBC: 5.1 10*3/uL (ref 4.0–10.5)

## 2023-07-19 LAB — COMPREHENSIVE METABOLIC PANEL
ALT: 10 U/L (ref 0–35)
AST: 11 U/L (ref 0–37)
Albumin: 4.3 g/dL (ref 3.5–5.2)
Alkaline Phosphatase: 62 U/L (ref 39–117)
BUN: 48 mg/dL — ABNORMAL HIGH (ref 6–23)
CO2: 24 meq/L (ref 19–32)
Calcium: 9.6 mg/dL (ref 8.4–10.5)
Chloride: 106 meq/L (ref 96–112)
Creatinine, Ser: 1.79 mg/dL — ABNORMAL HIGH (ref 0.40–1.20)
GFR: 27.81 mL/min — ABNORMAL LOW (ref >60.00)
Glucose, Bld: 163 mg/dL — ABNORMAL HIGH (ref 70–99)
Potassium: 4.8 meq/L (ref 3.5–5.1)
Sodium: 139 meq/L (ref 135–145)
Total Bilirubin: 0.5 mg/dL (ref 0.2–1.2)
Total Protein: 7.4 g/dL (ref 6.0–8.3)

## 2023-07-19 LAB — HEMOGLOBIN A1C: Hgb A1c MFr Bld: 7.2 % — ABNORMAL HIGH (ref 4.6–6.5)

## 2023-07-19 MED ORDER — DAPAGLIFLOZIN PROPANEDIOL 5 MG PO TABS: 5.0000 mg | ORAL_TABLET | Freq: Every day | ORAL | 1 refills | Status: DC

## 2023-07-19 NOTE — Progress Notes (Signed)
 Mariah Mason is a 73 y.o. female with the following history as recorded in EpicCare:  Patient Active Problem List   Diagnosis Date Noted   Osteoarthritis of knees, bilateral 12/04/2022   Bilateral lumbar radiculopathy 08/22/2020   DM type 2 with diabetic peripheral neuropathy (HCC) 08/02/2020   Gait abnormality 08/02/2020   Gout    Hypertension    Type 2 diabetes mellitus (HCC) 02/18/2019   AKI (acute kidney injury) (HCC) 02/18/2019   Hyperkalemia 02/18/2019   Metabolic acidosis 02/18/2019   Hypoglycemia 02/17/2019   Long term current use of oral hypoglycemic drug 05/23/2017   Presbyopia of both eyes 05/23/2017   Vitreous floaters of both eyes 05/23/2017   Keratoconjunctivitis sicca of both eyes not specified as Sjogren's 10/16/2016   Nuclear sclerotic cataract of both eyes 10/16/2016    Current Outpatient Medications  Medication Sig Dispense Refill   ACCU-CHEK GUIDE TEST test strip CHECK BLOOD SUGAR ONCE DAILY 100 strip 3   Accu-Chek Softclix Lancets lancets 4 (four) times daily.     allopurinol (ZYLOPRIM) 100 MG tablet TAKE 1 TABLET(100 MG) BY MOUTH DAILY 90 tablet 3   amLODipine (NORVASC) 10 MG tablet TAKE 1 TABLET(10 MG) BY MOUTH DAILY 90 tablet 3   ammonium lactate (LAC-HYDRIN) 12 % lotion APPLY TOPICALLY TO THE SKIN DAILY AS NEEDED FOR DRY SKIN 400 g 5   atorvastatin (LIPITOR) 20 MG tablet TAKE 1 TABLET(20 MG) BY MOUTH DAILY 90 tablet 3   blood glucose meter kit and supplies KIT Dispense based on patient and insurance preference. Use up to four times daily as directed. (FOR ICD-9 250.00, 250.01). 1 each 0   carvedilol (COREG) 25 MG tablet TAKE 1 TABLET(25 MG) BY MOUTH TWICE DAILY WITH A MEAL 180 tablet 3   cholecalciferol (VITAMIN D3) 25 MCG (1000 UT) tablet Take 1,000 Units by mouth daily.     cloNIDine (CATAPRES) 0.1 MG tablet TAKE 1 TABLET(0.1 MG) BY MOUTH EVERY EVENING 90 tablet 1   colchicine 0.6 MG tablet Take 2 tablets (1.2mg ) by mouth at onset for gout, may repeat 1  tablet 0.6mg  in 2 hours is symptoms persist 30 tablet 1   cycloSPORINE (RESTASIS) 0.05 % ophthalmic emulsion Place 1 drop into both eyes 2 (two) times daily.      Elastic Bandages & Supports (MEDICAL COMPRESSION SOCKS) MISC Compression socks for DM peripheral neuropathy and bilateral leg swelling 2 each 3   JANUVIA 50 MG tablet TAKE 1 TABLET(50 MG) BY MOUTH DAILY 90 tablet 1   Misc. Devices MISC Shower chair. Dx- Diabetes mellitus 1 each 0   Multiple Vitamins-Minerals (CENTRUM SILVER ADULT 50+ PO) Take 1 tablet by mouth daily.     NONFORMULARY OR COMPOUNDED ITEM Peripheral Neuropathy Cream: Bupivacaine 1%, Doxepin 3%, Gabapentin 6%, Pentoxifylline 3%, Topiramate 1% Order faxed to [facility] 1 each 3   valsartan (DIOVAN) 80 MG tablet Take 80 mg by mouth daily.     dapagliflozin propanediol (FARXIGA) 5 MG TABS tablet Take 1 tablet (5 mg total) by mouth daily. 90 tablet 1   No current facility-administered medications for this visit.    Allergies: Hydrochlorothiazide, Erythromycin, and Hydralazine  Past Medical History:  Diagnosis Date   Diabetes mellitus without complication (HCC)    Gout    Hyperlipemia    Hypertension    Neuropathy     Past Surgical History:  Procedure Laterality Date   CYST EXCISION N/A 08/30/2020   Procedure: EXCISION 1X2 cm LEFT NECK SEBACEOUS CYST EXCISION 2X2 CM RIGHT BACK  SEBACEOUS CYST;  Surgeon: Emelia Loron, MD;  Location: [facility];  Service: General;  Laterality: N/A;   THYROID SURGERY      Family History  Problem Relation Age of Onset   Heart attack Mother    Kidney failure Father     Social History   Tobacco Use   Smoking status: Never   Smokeless tobacco: Never  Substance Use Topics   Alcohol use: Never    Subjective:   Accompanied by her daughter; 6 month follow up chronic care needs; under care of nephrology- saw them on Tuesday and Diovan was increased to 80 mg daily;  Also needs letter clearing her from jury  duty and paperwork completed for the [place] [employer] 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  Head: Normocephalic and atraumatic  Lungs: Respirations unlabored; clear to auscultation bilaterally without wheeze, rales, rhonchi  CVS exam: normal rate and regular rhythm.  Neurologic: Alert and oriented; speech intact; face symmetrical; moves all extremities well; CNII-XII intact without focal deficit   Assessment:  1. Primary hypertension   2. DM type 2 with diabetic peripheral neuropathy (HCC)   3. Diabetes mellitus treated with oral medication (HCC)   4. Chronic kidney disease, unspecified CKD stage     Plan:  No acute concerns today- patient saw her nephrologist on Monday; will update labs today including CBC, CMP, Hba1c; refill updated;  Follow up here in 6 months, sooner prn.  Paperwork completed as requested for [employer] and letter to excuse patient from jury duty;   Time spent 30 minutes  No follow-ups on file.  Orders Placed This Encounter  Procedures   CBC with Differential/Platelet   Comp Met (CMET)   Hemoglobin A1c    Requested Prescriptions   Signed Prescriptions Disp Refills   dapagliflozin propanediol (FARXIGA) 5 MG TABS tablet 90 tablet 1    Sig: Take 1 tablet (5 mg total) by mouth daily.

## 2023-07-24 ENCOUNTER — Other Ambulatory Visit (HOSPITAL_COMMUNITY): Payer: Self-pay

## 2023-07-24 ENCOUNTER — Telehealth: Payer: Self-pay

## 2023-07-24 NOTE — Telephone Encounter (Signed)
 Pt needs PA for Farxiga please.

## 2023-07-24 NOTE — Telephone Encounter (Signed)
 Noted.

## 2023-07-24 NOTE — Telephone Encounter (Signed)
 Pharmacy Patient Advocate Encounter   Received notification from Pt Calls Messages that prior authorization for [facility] is required/requested.   Insurance verification completed.   The patient is insured through  [facility]  .   Per test claim: Refill too soon. PA is not needed at this time. Medication was filled 07/22/23. Next eligible fill date is 08/13/23.

## 2023-07-24 NOTE — Telephone Encounter (Signed)
 Per test claim: Refill too soon. PA is not needed at this time. Medication was filled 07/22/23. Next eligible fill date is 08/13/23.

## 2023-07-26 ENCOUNTER — Other Ambulatory Visit: Payer: Self-pay | Admitting: Family

## 2023-07-26 ENCOUNTER — Telehealth: Payer: Self-pay

## 2023-07-26 MED ORDER — DAPAGLIFLOZIN PROPANEDIOL 5 MG PO TABS: 5.0000 mg | ORAL_TABLET | Freq: Every day | ORAL | 3 refills | Status: DC

## 2023-07-26 NOTE — Telephone Encounter (Signed)
 Called pts daughter back and left a VM advising her that we have the prescription corrected and if she has any further questions or concerns to feel free to give the office a call back.

## 2023-07-26 NOTE — Telephone Encounter (Signed)
 Copied from CRM [id number]. Topic: Clinical - Prescription Issue >> Jul 26, 2023  9:27 AM Drema Balzarine wrote: Reason for CRM: Patient daughter called regarding patient Mariah Mason, said the pharmacy is filling the generic brand that doesn't work for patient and she needs the [place] sent that patient had before? Please call Luna Kitchens at [phone] for clarification

## 2023-08-05 ENCOUNTER — Telehealth: Payer: Self-pay | Admitting: Family

## 2023-08-05 NOTE — Telephone Encounter (Signed)
 Copied from CRM [id number]. Topic: General - Call Back - No Documentation >> Aug 05, 2023 11:14 AM Higinio Roger wrote: Reason for CRM: [facility] sent a fax for patient's diabetic supplies and would like to know if it was received  Callback # [phone]

## 2023-08-06 NOTE — Telephone Encounter (Signed)
 Form has been received, completed by the provider and faxed back to [facility] today 08/06/2023.

## 2023-08-16 ENCOUNTER — Other Ambulatory Visit: Payer: Self-pay | Admitting: Family

## 2023-08-20 ENCOUNTER — Telehealth: Payer: Self-pay

## 2023-08-20 NOTE — Telephone Encounter (Signed)
 Copied from CRM [id number]. Topic: Clinical - Prescription Issue >> Aug 20, 2023  7:55 AM Ovid Blow wrote: Reason for CRM: Patient's daughter called stating that patient has not received medication valsartan  (DIOVAN ) 40 MG tablet. Upon checking notes, I see that medication was refused but I don't see any notes stating why. Please give patient a call back regarding this matter

## 2023-08-20 NOTE — Telephone Encounter (Signed)
 Spoke with pts daughter, pts daughter states she "forgot" pt is now on the 80 mg. Advised pts daughter she is now aware and expressed understanding.

## 2023-08-27 ENCOUNTER — Other Ambulatory Visit: Payer: Self-pay | Admitting: Family

## 2023-08-27 DIAGNOSIS — E1149 Type 2 diabetes mellitus with other diabetic neurological complication: Secondary | ICD-10-CM

## 2023-09-03 ENCOUNTER — Ambulatory Visit (INDEPENDENT_AMBULATORY_CARE_PROVIDER_SITE_OTHER): Payer: Medicare Other | Admitting: Podiatry

## 2023-09-03 ENCOUNTER — Encounter: Payer: Self-pay | Admitting: Podiatry

## 2023-09-03 VITALS — Ht 64.0 in | Wt 203.8 lb

## 2023-09-03 DIAGNOSIS — B351 Tinea unguium: Secondary | ICD-10-CM | POA: Diagnosis not present

## 2023-09-03 DIAGNOSIS — M79609 Pain in unspecified limb: Secondary | ICD-10-CM | POA: Diagnosis not present

## 2023-09-03 DIAGNOSIS — M79676 Pain in unspecified toe(s): Secondary | ICD-10-CM | POA: Diagnosis not present

## 2023-09-03 DIAGNOSIS — E1142 Type 2 diabetes mellitus with diabetic polyneuropathy: Secondary | ICD-10-CM

## 2023-09-06 ENCOUNTER — Ambulatory Visit (INDEPENDENT_AMBULATORY_CARE_PROVIDER_SITE_OTHER): Admitting: Physician Assistant

## 2023-09-06 ENCOUNTER — Encounter: Payer: Self-pay | Admitting: Physician Assistant

## 2023-09-06 DIAGNOSIS — M170 Bilateral primary osteoarthritis of knee: Secondary | ICD-10-CM

## 2023-09-06 DIAGNOSIS — M1711 Unilateral primary osteoarthritis, right knee: Secondary | ICD-10-CM | POA: Diagnosis not present

## 2023-09-06 MED ORDER — METHYLPREDNISOLONE ACETATE 40 MG/ML IJ SUSP
40.0000 mg | INTRAMUSCULAR | Status: AC | PRN
  Administered 2023-09-06: 40 mg via INTRA_ARTICULAR

## 2023-09-06 MED ORDER — LIDOCAINE HCL 1 % IJ SOLN
3.0000 mL | INTRAMUSCULAR | Status: AC | PRN
Start: 2023-09-06 — End: 2023-09-06
  Administered 2023-09-06: 3 mL

## 2023-09-06 NOTE — Progress Notes (Signed)
 Office Visit Note   Patient: Mariah Mason           Date of Birth: Sep 21, 1950           MRN: [id number] Visit Date: 09/06/2023              Requested by: Adra Alanis, FNP [address] [place],  [place] [address] PCP: Adra Alanis, FNP  Right knee pain    HPI: Pleasant 73 year old woman with a history of bilateral osteoarthritis of her knees.  She was last injected last year.  She has recently had the return of painful symptoms right worse than left requesting an injection into her right knee today  Assessment & Plan: Visit Diagnoses:  1. Primary osteoarthritis of both knees     Plan: Injection given without difficulty she will return in 2 weeks as she is a diabetic to get her left knee injected  Follow-Up Instructions: Return in about 2 weeks (around 09/20/2023).   Ortho Exam  Patient is alert, oriented, no adenopathy, well-dressed, normal affect, normal respiratory effort. Examination of her right knee no effusion no erythema compartments are soft and compressible she is ambulating with an assistive device compartment soft nontender  Imaging: No results found. No images are attached to the encounter.  Labs: Lab Results  Component Value Date   HGBA1C 7.2 (H) 07/19/2023   HGBA1C 6.9 (H) 01/15/2023   HGBA1C 6.7 (H) 07/17/2022   ESRSEDRATE 51 (H) 08/02/2020   CRP 2 08/02/2020     Lab Results  Component Value Date   ALBUMIN 4.3 07/19/2023   ALBUMIN 4.1 01/15/2023   ALBUMIN 4.2 07/17/2022    Lab Results  Component Value Date   MG 2.3 04/07/2022   No results found for: "VD25OH"  No results found for: "PREALBUMIN"    Latest Ref Rng & Units 07/19/2023   10:13 AM 01/15/2023   10:55 AM 04/07/2022    8:30 PM  CBC EXTENDED  WBC 4.0 - 10.5 K/uL 5.1  4.8  6.4   RBC 3.87 - 5.11 Mil/uL 4.27  4.13  4.02   Hemoglobin 12.0 - 15.0 g/dL 19.1  47.8  29.5   HCT 36.0 - 46.0 % 36.1  34.8  33.9   Platelets 150.0 - 400.0 K/uL 235.0   249.0  244   NEUT# 1.4 - 7.7 K/uL 3.1  2.9  3.9   Lymph# 0.7 - 4.0 K/uL 1.5  1.4  1.9      There is no height or weight on file to calculate BMI.  Orders:  No orders of the defined types were placed in this encounter.  No orders of the defined types were placed in this encounter.    Procedures: Large Joint Inj: R knee on 09/06/2023 9:29 AM Indications: pain and diagnostic evaluation Details: 25 G 1.5 in needle, anteromedial approach  Arthrogram: No  Medications: 40 mg methylPREDNISolone  acetate 40 MG/ML; 3 mL lidocaine  1 % Outcome: tolerated well, no immediate complications Procedure, treatment alternatives, risks and benefits explained, specific risks discussed. Consent was given by the patient.    Clinical Data: No additional findings.  ROS:  All other systems negative, except as noted in the HPI. Review of Systems  Objective: Vital Signs: There were no vitals taken for this visit.  Specialty Comments:  No specialty comments available.  PMFS History: Patient Active Problem List   Diagnosis Date Noted  . Osteoarthritis of knees, bilateral 12/04/2022  . Bilateral lumbar radiculopathy 08/22/2020  .  DM type 2 with diabetic peripheral neuropathy (HCC) 08/02/2020  . Gait abnormality 08/02/2020  . Gout   . Hypertension   . Type 2 diabetes mellitus (HCC) 02/18/2019  . AKI (acute kidney injury) (HCC) 02/18/2019  . Hyperkalemia 02/18/2019  . Metabolic acidosis 02/18/2019  . Hypoglycemia 02/17/2019  . Long term current use of oral hypoglycemic drug 05/23/2017  . Presbyopia of both eyes 05/23/2017  . Vitreous floaters of both eyes 05/23/2017  . Keratoconjunctivitis sicca of both eyes not specified as Sjogren's 10/16/2016  . Nuclear sclerotic cataract of both eyes 10/16/2016   Past Medical History:  Diagnosis Date  . Diabetes mellitus without complication (HCC)   . Gout   . Hyperlipemia   . Hypertension   . Neuropathy     Family History  Problem Relation Age of  Onset  . Heart attack Mother   . Kidney failure Father     Past Surgical History:  Procedure Laterality Date  . CYST EXCISION N/A 08/30/2020   Procedure: EXCISION 1X2 cm LEFT NECK SEBACEOUS CYST EXCISION 2X2 CM RIGHT BACK SEBACEOUS CYST;  Surgeon: Enid Harry, MD;  Location: [facility];  Service: General;  Laterality: N/A;  . THYROID  SURGERY     Social History   Occupational History  . Occupation: Retired  Tobacco Use  . Smoking status: Never  . Smokeless tobacco: Never  Vaping Use  . Vaping status: Never Used  Substance and Sexual Activity  . Alcohol use: Never  . Drug use: Never  . Sexual activity: Not on file

## 2023-09-08 NOTE — Progress Notes (Signed)
  Subjective:  Patient ID: Mariah Mason, female    DOB: [birth year],  MRN: [id number]  Mariah Mason presents to clinic today for at risk foot care with history of diabetic neuropathy and painful, elongated thickened toenails x 10 which are symptomatic when wearing enclosed shoe gear. This interferes with his/her daily activities.  Chief Complaint  Patient presents with   Nail Problem    Pt is here for Mariah Mason last A1C was 7.1 PCP is Dr Ike Malady and LOV was in March.   New problem(s): None.   PCP is Adra Alanis, FNP.  Allergies  Allergen Reactions   Hydrochlorothiazide     Other Reaction(s): dizziness   Erythromycin Other (See Comments)    Does not remember reaction.   Hydralazine  Other (See Comments)    Not able to tolerate per patient; caused her to feel dizzy    Review of Systems: Negative except as noted in the HPI.  Objective: No changes noted in today's physical examination. There were no vitals filed for this visit. Mariah Mason is a pleasant 73 y.o. female in NAD. AAO x 3.  Vascular Examination: CFT <4 seconds b/l. DP pulses diminished b/l. PT pulses palpable b/l. Digital hair absent. Skin temperature gradient warm to cool b/l. No ischemia or gangrene. No cyanosis or clubbing noted b/l. Trace edema noted BLE.   Neurological Examination: Sensation grossly intact b/l with 10 gram monofilament. Vibratory sensation intact b/l. Pt has subjective symptoms of neuropathy.  Dermatological Examination: Pedal skin thin, shiny and atrophic b/l. No open wounds. No interdigital macerations.   Toenails 1-5 b/l thick, discolored, elongated with subungual debris and pain on dorsal palpation.   No corns, calluses nor porokeratotic lesions noted.  Musculoskeletal Examination: Muscle strength 5/5 to all lower extremity muscle groups bilaterally. HAV with bunion deformity noted b/l LE. Hammertoe deformity noted 2-5 b/l.  Radiographs: None  Last A1c:      Latest Ref Rng &  Units 07/19/2023   10:13 AM 01/15/2023   10:55 AM  Hemoglobin A1C  Hemoglobin-A1c 4.6 - 6.5 % 7.2  6.9    Assessment/Plan: 1. Pain due to onychomycosis of nail   2. Diabetic peripheral neuropathy associated with type 2 diabetes mellitus (HCC)   Patient was evaluated and treated. All patient's and/or POA's questions/concerns addressed on today's visit. Mycotic toenails 1-5 debrided in length and girth without incident.  Continue daily foot inspections and monitor blood glucose per PCP/Endocrinologist's recommendations.Continue soft, supportive shoe gear daily. Report any pedal injuries to medical professional. Call office if there are any quesitons/concerns. -Patient/POA to call should there be question/concern in the interim.   Return in about 3 months (around 12/04/2023).  Mariah Mason, DPM      [place] LOCATION: [address] [place] [address]                   Office [phone]   [facility] LOCATION: [address] [place], [place] [address] Office [phone]

## 2023-09-20 ENCOUNTER — Encounter: Payer: Self-pay | Admitting: Physician Assistant

## 2023-09-20 ENCOUNTER — Ambulatory Visit (INDEPENDENT_AMBULATORY_CARE_PROVIDER_SITE_OTHER): Admitting: Physician Assistant

## 2023-09-20 DIAGNOSIS — M170 Bilateral primary osteoarthritis of knee: Secondary | ICD-10-CM

## 2023-09-20 DIAGNOSIS — M1712 Unilateral primary osteoarthritis, left knee: Secondary | ICD-10-CM | POA: Diagnosis not present

## 2023-09-20 MED ORDER — METHYLPREDNISOLONE ACETATE 40 MG/ML IJ SUSP
40.0000 mg | INTRAMUSCULAR | Status: AC | PRN
  Administered 2023-09-20: 40 mg via INTRA_ARTICULAR

## 2023-09-20 MED ORDER — LIDOCAINE HCL 1 % IJ SOLN
3.0000 mL | INTRAMUSCULAR | Status: AC | PRN
  Administered 2023-09-20: 3 mL

## 2023-09-20 NOTE — Progress Notes (Signed)
 Office Visit Note   Patient: Mariah Mason           Date of Birth: Mar 21, 1951           MRN: [id number] Visit Date: 09/20/2023              Requested by: Adra Alanis, FNP [address] [place],  [place] [address] PCP: Adra Alanis, FNP  Left knee pain    HPI: Pleasant 73 year old woman with history of bilateral osteoarthritis of her knees.  I injected her right knee a couple weeks ago she does have a history of diabetes with a hemoglobin A1c of 7.2 she is here for injection into the left knee she tolerated the right knee well  Assessment & Plan: Visit Diagnoses:  1. Primary osteoarthritis of both knees     Plan: Went forward with left knee injection without difficulty may follow-up as needed  Follow-Up Instructions: Return if symptoms worsen or fail to improve.   Ortho Exam  Patient is alert, oriented, no adenopathy, well-dressed, normal affect, normal respiratory effort. Left knee no effusion no erythema compartments are soft and nontender she is neurovascular intact  Imaging: No results found. No images are attached to the encounter.  Labs: Lab Results  Component Value Date   HGBA1C 7.2 (H) 07/19/2023   HGBA1C 6.9 (H) 01/15/2023   HGBA1C 6.7 (H) 07/17/2022   ESRSEDRATE 51 (H) 08/02/2020   CRP 2 08/02/2020     Lab Results  Component Value Date   ALBUMIN 4.3 07/19/2023   ALBUMIN 4.1 01/15/2023   ALBUMIN 4.2 07/17/2022    Lab Results  Component Value Date   MG 2.3 04/07/2022   No results found for: "VD25OH"  No results found for: "PREALBUMIN"    Latest Ref Rng & Units 07/19/2023   10:13 AM 01/15/2023   10:55 AM 04/07/2022    8:30 PM  CBC EXTENDED  WBC 4.0 - 10.5 K/uL 5.1  4.8  6.4   RBC 3.87 - 5.11 Mil/uL 4.27  4.13  4.02   Hemoglobin 12.0 - 15.0 g/dL 19.1  47.8  29.5   HCT 36.0 - 46.0 % 36.1  34.8  33.9   Platelets 150.0 - 400.0 K/uL 235.0  249.0  244   NEUT# 1.4 - 7.7 K/uL 3.1  2.9  3.9   Lymph# 0.7 - 4.0  K/uL 1.5  1.4  1.9      There is no height or weight on file to calculate BMI.  Orders:  No orders of the defined types were placed in this encounter.  No orders of the defined types were placed in this encounter.    Procedures: Large Joint Inj: L knee on 09/20/2023 10:28 AM Indications: pain and diagnostic evaluation Details: 25 G 1.5 in needle, anteromedial approach  Arthrogram: No  Medications: 40 mg methylPREDNISolone  acetate 40 MG/ML; 3 mL lidocaine  1 % Outcome: tolerated well, no immediate complications Procedure, treatment alternatives, risks and benefits explained, specific risks discussed. Consent was given by the patient.    Clinical Data: No additional findings.  ROS:  All other systems negative, except as noted in the HPI. Review of Systems  Objective: Vital Signs: There were no vitals taken for this visit.  Specialty Comments:  No specialty comments available.  PMFS History: Patient Active Problem List   Diagnosis Date Noted  . Osteoarthritis of knees, bilateral 12/04/2022  . Bilateral lumbar radiculopathy 08/22/2020  . DM type 2 with diabetic peripheral neuropathy (HCC)  08/02/2020  . Gait abnormality 08/02/2020  . Gout   . Hypertension   . Type 2 diabetes mellitus (HCC) 02/18/2019  . AKI (acute kidney injury) (HCC) 02/18/2019  . Hyperkalemia 02/18/2019  . Metabolic acidosis 02/18/2019  . Hypoglycemia 02/17/2019  . Long term current use of oral hypoglycemic drug 05/23/2017  . Presbyopia of both eyes 05/23/2017  . Vitreous floaters of both eyes 05/23/2017  . Keratoconjunctivitis sicca of both eyes not specified as Sjogren's 10/16/2016  . Nuclear sclerotic cataract of both eyes 10/16/2016   Past Medical History:  Diagnosis Date  . Diabetes mellitus without complication (HCC)   . Gout   . Hyperlipemia   . Hypertension   . Neuropathy     Family History  Problem Relation Age of Onset  . Heart attack Mother   . Kidney failure Father      Past Surgical History:  Procedure Laterality Date  . CYST EXCISION N/A 08/30/2020   Procedure: EXCISION 1X2 cm LEFT NECK SEBACEOUS CYST EXCISION 2X2 CM RIGHT BACK SEBACEOUS CYST;  Surgeon: Enid Harry, MD;  Location: [facility];  Service: General;  Laterality: N/A;  . THYROID SURGERY     Social History   Occupational History  . Occupation: Retired  Tobacco Use  . Smoking status: Never  . Smokeless tobacco: Never  Vaping Use  . Vaping status: Never Used  Substance and Sexual Activity  . Alcohol use: Never  . Drug use: Never  . Sexual activity: Not on file

## 2023-11-23 ENCOUNTER — Other Ambulatory Visit: Payer: Self-pay | Admitting: Family

## 2023-11-23 DIAGNOSIS — M1A00X0 Idiopathic chronic gout, unspecified site, without tophus (tophi): Secondary | ICD-10-CM

## 2023-11-27 ENCOUNTER — Ambulatory Visit (INDEPENDENT_AMBULATORY_CARE_PROVIDER_SITE_OTHER)

## 2023-11-27 VITALS — BP 140/72 | Ht 64.0 in | Wt 206.0 lb

## 2023-11-27 DIAGNOSIS — Z0000 Encounter for general adult medical examination without abnormal findings: Secondary | ICD-10-CM

## 2023-11-27 NOTE — Progress Notes (Signed)
 Because this visit was a virtual/telehealth visit,  certain criteria was not obtained, such a blood pressure, CBG if applicable, and timed get up and go. Any medications not marked as taking were not mentioned during the medication reconciliation part of the visit. Any vitals not documented were not able to be obtained due to this being a telehealth visit or patient was unable to self-report a recent blood pressure reading due to a lack of equipment at home via telehealth. Vitals that have been documented are verbally provided by the patient.   This visit was performed by a medical professional under my direct supervision. I was immediately available for consultation/collaboration. I have reviewed and agree with the Annual Wellness Visit documentation.  Subjective:   Mariah Mason is a 73 y.o. who presents for a Medicare Wellness preventive visit.  As a reminder, Annual Wellness Visits don't include a physical exam, and some assessments may be limited, especially if this visit is performed virtually. We may recommend an in-person follow-up visit with your provider if needed.  Visit Complete: Virtual I connected with  Mariah Mason on 11/27/23 by a audio enabled telemedicine application and verified that I am speaking with the correct person using two identifiers.  Patient Location: Home  Provider Location: Home Office  I discussed the limitations of evaluation and management by telemedicine. The patient expressed understanding and agreed to proceed.  Vital Signs: Because this visit was a virtual/telehealth visit, some criteria may be missing or patient reported. Any vitals not documented were not able to be obtained and vitals that have been documented are patient reported.  VideoDeclined- This patient declined Librarian, academic. Therefore the visit was completed with audio only.  Persons Participating in Visit: Patient.  AWV Questionnaire: No: Patient Medicare  AWV questionnaire was not completed prior to this visit.  Cardiac Risk Factors include: advanced age (>38men, >26 women);diabetes mellitus;obesity (BMI >30kg/m2);hypertension     Objective:    Today's Vitals   11/27/23 1613  BP: (!) 140/72  Weight: 206 lb (93.4 kg)  Height: 5' 4 (1.626 m)   Body mass index is 35.36 kg/m.     11/27/2023    4:13 PM 04/07/2022    4:32 PM 12/18/2021   11:29 AM 10/10/2021    1:03 AM 10/02/2021    4:47 PM 08/30/2020    9:38 AM 08/23/2020    1:08 PM  Advanced Directives  Does Patient Have a Medical Advance Directive? No No No No No No No  Does patient want to make changes to medical advance directive?       No - Patient declined  Would patient like information on creating a medical advance directive? No - Patient declined    No - Patient declined No - Patient declined     Current Medications (verified) Outpatient Encounter Medications as of 11/27/2023  Medication Sig   ACCU-CHEK GUIDE TEST test strip CHECK BLOOD SUGAR ONCE DAILY   Accu-Chek Softclix Lancets lancets 4 (four) times daily.   allopurinol  (ZYLOPRIM ) 100 MG tablet Take 1 tablet (100 mg total) by mouth daily.   amLODipine  (NORVASC ) 10 MG tablet TAKE 1 TABLET(10 MG) BY MOUTH DAILY   ammonium lactate  (LAC-HYDRIN ) 12 % lotion APPLY TOPICALLY TO THE SKIN DAILY AS NEEDED FOR DRY SKIN   atorvastatin  (LIPITOR) 20 MG tablet TAKE 1 TABLET(20 MG) BY MOUTH DAILY   blood glucose meter kit and supplies KIT Dispense based on patient and insurance preference. Use up to four times daily as directed. (  FOR ICD-9 250.00, 250.01).   carvedilol  (COREG ) 25 MG tablet TAKE 1 TABLET(25 MG) BY MOUTH TWICE DAILY WITH A MEAL   cholecalciferol (VITAMIN D3) 25 MCG (1000 UT) tablet Take 1,000 Units by mouth daily.   cloNIDine  (CATAPRES ) 0.1 MG tablet TAKE 1 TABLET(0.1 MG) BY MOUTH EVERY EVENING   colchicine  0.6 MG tablet Take 2 tablets (1.2mg ) by mouth at onset for gout, may repeat 1 tablet 0.6mg  in 2 hours is symptoms  persist   cycloSPORINE  (RESTASIS ) 0.05 % ophthalmic emulsion Place 1 drop into both eyes 2 (two) times daily.    dapagliflozin  propanediol (FARXIGA ) 5 MG TABS tablet Take 1 tablet (5 mg total) by mouth daily.   Elastic Bandages & Supports (MEDICAL COMPRESSION SOCKS) MISC Compression socks for DM peripheral neuropathy and bilateral leg swelling   Misc. Devices MISC Shower chair. Dx- Diabetes mellitus   Multiple Vitamins-Minerals (CENTRUM SILVER ADULT 50+ PO) Take 1 tablet by mouth daily.   NONFORMULARY OR COMPOUNDED ITEM Peripheral Neuropathy Cream: Bupivacaine  1%, Doxepin 3%, Gabapentin  6%, Pentoxifylline 3%, Topiramate 1% Order faxed to [facility]   sitaGLIPtin  (JANUVIA ) 50 MG tablet Take 1 tablet (50 mg total) by mouth daily.   valsartan  (DIOVAN ) 80 MG tablet Take 80 mg by mouth daily.   No facility-administered encounter medications on file as of 11/27/2023.    Allergies (verified) Hydrochlorothiazide, Erythromycin, and Hydralazine    History: Past Medical History:  Diagnosis Date   Diabetes mellitus without complication (HCC)    Gout    Hyperlipemia    Hypertension    Neuropathy    Past Surgical History:  Procedure Laterality Date   CYST EXCISION N/A 08/30/2020   Procedure: EXCISION 1X2 cm LEFT NECK SEBACEOUS CYST EXCISION 2X2 CM Mason BACK SEBACEOUS CYST;  Surgeon: Ebbie Cough, MD;  Location: [facility];  Service: General;  Laterality: N/A;   THYROID SURGERY     Family History  Problem Relation Age of Onset   Heart attack Mother    Kidney failure Father    Social History   Socioeconomic History   Marital status: Single    Spouse name: Not on file   Number of children: 6   Years of education: 9th grade   Highest education level: Not on file  Occupational History   Occupation: Retired  Tobacco Use   Smoking status: Never   Smokeless tobacco: Never  Vaping Use   Vaping status: Never Used  Substance and Sexual Activity   Alcohol use:  Never   Drug use: Never   Sexual activity: Not on file  Other Topics Concern   Not on file  Social History Narrative   Lives with family.   Mason-handed.   No daily use of caffeine.   Social Drivers of Corporate investment banker Strain: Low Risk  (11/27/2023)   Overall Financial Resource Strain (CARDIA)    Difficulty of Paying Living Expenses: Not hard at all  Food Insecurity: No Food Insecurity (11/27/2023)   Hunger Vital Sign    Worried About Running Out of Food in the Last Year: Never true    Ran Out of Food in the Last Year: Never true  Transportation Needs: No Transportation Needs (11/27/2023)   PRAPARE - Administrator, Civil Service (Medical): No    Lack of Transportation (Non-Medical): No  Physical Activity: Sufficiently Active (11/27/2023)   Exercise Vital Sign    Days of Exercise per Week: 6 days    Minutes of Exercise per Session: 30  min  Stress: No Stress Concern Present (11/27/2023)   [employer] of Occupational Health - Occupational Stress Questionnaire    Feeling of Stress: Not at all  Social Connections: Moderately Integrated (11/27/2023)   Social Connection and Isolation Panel    Frequency of Communication with Friends and Family: More than three times a week    Frequency of Social Gatherings with Friends and Family: More than three times a week    Attends Religious Services: More than 4 times per year    Active Member of [employer] or Organizations: Yes    Attends Engineer, structural: More than 4 times per year    Marital Status: Never married    Tobacco Counseling Counseling given: Not Answered    Clinical Intake:  Pre-visit preparation completed: Yes  Pain : No/denies pain     BMI - recorded: 35.36 Nutritional Status: BMI > 30  Obese Nutritional Risks: None Diabetes: Yes CBG done?: No Did pt. bring in CBG monitor from home?: No  Lab Results  Component Value Date   HGBA1C 7.2 (H) 07/19/2023   HGBA1C 6.9 (H) 01/15/2023    HGBA1C 6.7 (H) 07/17/2022     How often do you need to have someone help you when you read instructions, pamphlets, or other written materials from your doctor or pharmacy?: 1 - Never What is the last grade level you completed in school?: 9th grade  Interpreter Needed?: No  Information entered by :: Mariah Anderson LATHER   Activities of Daily Living     11/27/2023    4:17 PM  In your present state of health, do you have any difficulty performing the following activities:  Hearing? 0  Vision? 0  Difficulty concentrating or making decisions? 0  Walking or climbing stairs? 0  Dressing or bathing? 0  Doing errands, shopping? 0  Preparing Food and eating ? N  Using the Toilet? N  In the past six months, have you accidently leaked urine? N  Do you have problems with loss of bowel control? N  Managing your Medications? N  Managing your Finances? N  Housekeeping or managing your Housekeeping? N    Patient Care Team: Jason Leita Repine, FNP as PCP - General (Internal Medicine) Gaynel Delon CROME, DPM as Consulting Physician (Podiatry)  I have updated your Care Teams any recent Medical Services you may have received from other providers in the past year.     Assessment:   This is a routine wellness examination for Mariah Mason.  Hearing/Vision screen Hearing Screening - Comments:: No difficulties Vision Screening - Comments:: Patient wears glasses and have an appointment 12/05/2023   Goals Addressed             This Visit's Progress    Patient Stated   On track    Maintain current health       Depression Screen     11/27/2023    4:19 PM 07/19/2023    9:39 AM 07/17/2022    9:12 AM 01/12/2022    8:59 AM 12/18/2021   11:33 AM 11/09/2021    9:09 AM 03/28/2021    2:28 PM  PHQ 2/9 Scores  PHQ - 2 Score 0 0 0 0 0 0 0  PHQ- 9 Score 0          Fall Risk     11/27/2023    4:16 PM 07/19/2023    9:39 AM 07/17/2022    9:12 AM 01/12/2022    8:59 AM 11/09/2021    9:09  AM   Fall Risk   Falls in the past year? 0 0 0 0 0  Number falls in past yr: 0 0 0 0 0  Injury with Fall? 0 0 0 0 0  Risk for fall due to : No Fall Risks No Fall Risks Impaired balance/gait No Fall Risks   Follow up Falls evaluation completed Falls evaluation completed Falls evaluation completed Falls evaluation completed       Data saved with a previous flowsheet row definition    MEDICARE RISK AT HOME:  Medicare Risk at Home Any stairs in or around the home?: Yes If so, are there any without handrails?: No Home free of loose throw rugs in walkways, pet beds, electrical cords, etc?: Yes Adequate lighting in your home to reduce risk of falls?: Yes Life alert?: No Use of a cane, walker or w/c?: No Grab bars in the bathroom?: Yes Shower chair or bench in shower?: Yes Elevated toilet seat or a handicapped toilet?: Yes  TIMED UP AND GO:  Was the test performed?  No  Cognitive Function: 6CIT completed        11/27/2023    4:19 PM 12/18/2021   11:39 AM 12/15/2020    4:32 PM  6CIT Screen  What Year? 0 points 0 points 0 points  What month? 0 points 0 points 0 points  What time? 0 points 0 points 3 points  Count back from 20 0 points 0 points 0 points  Months in reverse 0 points 4 points 4 points  Repeat phrase 0 points 8 points 2 points  Total Score 0 points 12 points 9 points    Immunizations Immunization History  Administered Date(s) Administered   Fluad Quad(high Dose 65+) 01/12/2022   Fluad Trivalent(High Dose 65+) 01/15/2023   PNEUMOCOCCAL CONJUGATE-20 03/28/2021   Pneumococcal Conjugate-13 08/12/2019    Screening Tests Health Maintenance  Topic Date Due   DTaP/Tdap/Td (1 - Tdap) Never done   Zoster Vaccines- Shingrix (1 of 2) Never done   Diabetic kidney evaluation - Urine ACR  06/29/2020   OPHTHALMOLOGY EXAM  11/28/2023   INFLUENZA VACCINE  11/29/2023   HEMOGLOBIN A1C  01/19/2024   FOOT EXAM  06/03/2024   Diabetic kidney evaluation - eGFR measurement   07/18/2024   Medicare Annual Wellness (AWV)  11/26/2024   MAMMOGRAM  01/17/2025   Fecal DNA (Cologuard)  09/23/2025   Pneumococcal Vaccine: 50+ Years  Completed   DEXA SCAN  Completed   Hepatitis C Screening  Completed   Hepatitis B Vaccines  Aged Out   HPV VACCINES  Aged Out   Meningococcal B Vaccine  Aged Out   COVID-19 Vaccine  Discontinued    Health Maintenance  Health Maintenance Due  Topic Date Due   DTaP/Tdap/Td (1 - Tdap) Never done   Zoster Vaccines- Shingrix (1 of 2) Never done   Diabetic kidney evaluation - Urine ACR  06/29/2020   Health Maintenance Items Addressed:patient declined vaccinations   Additional Screening:  Vision Screening: Recommended annual ophthalmology exams for early detection of glaucoma and other disorders of the eye. Would you like a referral to an eye doctor? No    Dental Screening: Recommended annual dental exams for proper oral hygiene  Community Resource Referral / Chronic Care Management: CRR required this visit?  No   CCM required this visit?  No   Plan:    I have personally reviewed and noted the following in the patient's chart:   Medical and social history Use of  alcohol, tobacco or illicit drugs  Current medications and supplements including opioid prescriptions. Patient is not currently taking opioid prescriptions. Functional ability and status Nutritional status Physical activity Advanced directives List of other physicians Hospitalizations, surgeries, and ER visits in previous 12 months Vitals Screenings to include cognitive, depression, and falls Referrals and appointments  In addition, I have reviewed and discussed with patient certain preventive protocols, quality metrics, and best practice recommendations. A written personalized care plan for preventive services as well as general preventive health recommendations were provided to patient.   Mariah Mason, Mariah Mason   11/27/2023   After Visit Summary: (MyChart)  Due to this being a telephonic visit, the after visit summary with patients personalized plan was offered to patient via MyChart   Notes: Nothing significant to report at this time.

## 2023-11-27 NOTE — Patient Instructions (Signed)
 Ms. Lumbra , Thank you for taking time out of your busy schedule to complete your Annual Wellness Visit with me. I enjoyed our conversation and look forward to speaking with you again next year. I, as well as your care team,  appreciate your ongoing commitment to your health goals. Please review the following plan we discussed and let me know if I can assist you in the future. Your Game plan/ To Do List    Referrals: If you haven't heard from the office you've been referred to, please reach out to them at the phone provided.  none Follow up Visits: Next Medicare AWV with our clinical staff: 12/02/2024   Have you seen your provider in the last 6 months (3 months if uncontrolled diabetes)? Yes Next Office Visit with your provider: 01/24/2024  Clinician Recommendations:  Aim for 30 minutes of exercise or brisk walking, 6-8 glasses of water, and 5 servings of fruits and vegetables each day.       This is a list of the screening recommended for you and due dates:  Health Maintenance  Topic Date Due   DTaP/Tdap/Td vaccine (1 - Tdap) Never done   Zoster (Shingles) Vaccine (1 of 2) Never done   Yearly kidney health urinalysis for diabetes  06/29/2020   Eye exam for diabetics  11/28/2023   Flu Shot  11/29/2023   Hemoglobin A1C  01/19/2024   Complete foot exam   06/03/2024   Yearly kidney function blood test for diabetes  07/18/2024   Medicare Annual Wellness Visit  11/26/2024   Mammogram  01/17/2025   Cologuard (Stool DNA test)  09/23/2025   Pneumococcal Vaccine for age over 62  Completed   DEXA scan (bone density measurement)  Completed   Hepatitis C Screening  Completed   Hepatitis B Vaccine  Aged Out   HPV Vaccine  Aged Out   Meningitis B Vaccine  Aged Out   COVID-19 Vaccine  Discontinued    Advanced directives: (Declined) Advance directive discussed with you today. Even though you declined this today, please call our office should you change your mind, and we can give you the proper  paperwork for you to fill out. Advance Care Planning is important because it:  [x]  Makes sure you receive the medical care that is consistent with your values, goals, and preferences  [x]  It provides guidance to your family and loved ones and reduces their decisional burden about whether or not they are making the right decisions based on your wishes.  Follow the link provided in your after visit summary or read over the paperwork we have mailed to you to help you started getting your Advance Directives in place. If you need assistance in completing these, please reach out to us  so that we can help you!  See attachments for Preventive Care and Fall Prevention Tips.

## 2023-12-06 ENCOUNTER — Encounter (HOSPITAL_BASED_OUTPATIENT_CLINIC_OR_DEPARTMENT_OTHER): Payer: Self-pay

## 2023-12-06 ENCOUNTER — Emergency Department (HOSPITAL_BASED_OUTPATIENT_CLINIC_OR_DEPARTMENT_OTHER)
Admission: EM | Admit: 2023-12-06 | Discharge: 2023-12-06 | Disposition: A | Discharge status: Home / Self Care | Attending: Emergency Medicine | Admitting: Emergency Medicine

## 2023-12-06 ENCOUNTER — Emergency Department (HOSPITAL_BASED_OUTPATIENT_CLINIC_OR_DEPARTMENT_OTHER)

## 2023-12-06 ENCOUNTER — Other Ambulatory Visit: Payer: Self-pay

## 2023-12-06 DIAGNOSIS — M25561 Pain in right knee: Secondary | ICD-10-CM | POA: Diagnosis present

## 2023-12-06 DIAGNOSIS — M1711 Unilateral primary osteoarthritis, right knee: Secondary | ICD-10-CM | POA: Insufficient documentation

## 2023-12-06 HISTORY — DX: Gastro-esophageal reflux disease without esophagitis: K21.9

## 2023-12-06 MED ORDER — HYDROCODONE-ACETAMINOPHEN 5-325 MG PO TABS
1.0000 | ORAL_TABLET | Freq: Four times a day (QID) | ORAL | 0 refills | Status: DC | PRN
Start: 2023-12-06 — End: 2024-03-20

## 2023-12-06 NOTE — Discharge Instructions (Addendum)
 1.  Make an appointment to see your orthopedic specialist as soon as possible.  If you are due for another injection in your knee, this should be done at your specialist office where you have been treated previously. 2.  Try to elevate and ice the knee is much as possible. 3.  For pain, I have prescribed Vicodin (hydrocodone \acetaminophen ).  This is a stronger pain medication.  You may take 1 Vicodinin the morning and 1 Vicodin later afternoon with a dose of acetaminophen  (either two 325 mg regular strength tablets or one 500 mg extra strength tablet). 4.  Return to the emergency department if you have fever, redness or significantly worsening pain of the knee.

## 2023-12-06 NOTE — ED Provider Notes (Signed)
 [place] [facility] Provider Note   CSN: [id number] Arrival date & time: 12/06/23  1053     Patient presents with: Knee Pain   Mariah Mason is a 73 y.o. female.   HPI Patient has history of significant osteoarthritis of both knees.  She last had [facility] of the right [facility]  Subsequent to that she was rescheduled to come back and get the left knee injected.  Patient reports that the right knee did not get a lot better in terms of pain.  She is due for an injection again but is having a lot of aching discomfort in the little bit of additional swelling over the past few days.  Patient reports she is taking Tylenol  with some relief.  She reports however it is difficult for her to walk due to pain in the knee.  No fever no chill no general malaise.    Prior to Admission medications   Medication Sig Start Date End Date Taking? Authorizing Provider  ferrous sulfate 325 (65 FE) MG tablet Take 325 mg by mouth daily with breakfast.   Yes [provider]  sodium bicarbonate  650 MG tablet Take 650 mg by mouth 2 (two) times daily.   Yes [provider]  ACCU-CHEK GUIDE TEST test strip CHECK BLOOD SUGAR ONCE DAILY 05/06/23   Jason Leita Repine, FNP  Accu-Chek Softclix Lancets lancets 4 (four) times daily. 04/27/21   [provider]  allopurinol  (ZYLOPRIM ) 100 MG tablet Take 1 tablet (100 mg total) by mouth daily. 11/25/23   Jason Leita Repine, FNP  amLODipine  (NORVASC ) 10 MG tablet TAKE 1 TABLET(10 MG) BY MOUTH DAILY 03/01/23   Jason Leita Repine, FNP  ammonium lactate  (LAC-HYDRIN ) 12 % lotion APPLY TOPICALLY TO THE SKIN DAILY AS NEEDED FOR DRY SKIN 06/03/23   Gaynel Delon CROME, DPM  atorvastatin  (LIPITOR) 20 MG tablet TAKE 1 TABLET(20 MG) BY MOUTH DAILY 05/30/23   Jason Leita Repine, FNP  blood glucose meter kit and supplies KIT Dispense based on patient and insurance preference. Use up to four  times daily as directed. (FOR ICD-9 250.00, 250.01). 03/28/21   Jason Leita Repine, FNP  carvedilol  (COREG ) 25 MG tablet TAKE 1 TABLET(25 MG) BY MOUTH TWICE DAILY WITH A MEAL 05/30/23   Jason Leita Repine, FNP  cholecalciferol (VITAMIN D3) 25 MCG (1000 UT) tablet Take 1,000 Units by mouth daily.    [provider]  cloNIDine  (CATAPRES ) 0.1 MG tablet TAKE 1 TABLET(0.1 MG) BY MOUTH EVERY EVENING 12/29/21   Jason Leita Repine, FNP  colchicine  0.6 MG tablet Take 2 tablets (1.2mg ) by mouth at onset for gout, may repeat 1 tablet 0.6mg  in 2 hours is symptoms persist 09/26/21   Jason Leita Repine, FNP  cycloSPORINE  (RESTASIS ) 0.05 % ophthalmic emulsion Place 1 drop into both eyes 2 (two) times daily.  12/05/18   [provider]  dapagliflozin  propanediol (FARXIGA ) 5 MG TABS tablet Take 1 tablet (5 mg total) by mouth daily. 07/26/23   Jason Leita Repine, FNP  Elastic Bandages & Supports (MEDICAL COMPRESSION SOCKS) MISC Compression socks for DM peripheral neuropathy and bilateral leg swelling 08/02/20   Onita Duos, MD  Misc. Devices MISC Shower chair. Dx- Diabetes mellitus 08/12/19   Delbert Clam, MD  Multiple Vitamins-Minerals (CENTRUM SILVER ADULT 50+ PO) Take 1 tablet by mouth daily.    [provider]  NONFORMULARY OR COMPOUNDED ITEM Peripheral Neuropathy Cream: Bupivacaine  1%, Doxepin 3%, Gabapentin  6%, Pentoxifylline  3%, Topiramate 1% Order faxed to [facility] 10/05/19   Gaynel Delon CROME, DPM  sitaGLIPtin  (JANUVIA ) 50 MG tablet Take 1 tablet (50 mg total) by mouth daily. 08/27/23   Jason Leita Repine, FNP  valsartan  (DIOVAN ) 80 MG tablet Take 80 mg by mouth daily.    [provider]    Allergies: Hydrochlorothiazide, Erythromycin, and Hydralazine     Review of Systems  Updated Vital Signs BP (!) 158/68 (BP Location: Left Arm)   Pulse 86   Temp 97.6 F (36.4 C)   Resp 18   Ht 5' 2 (1.575 m)   Wt 95.3 kg   SpO2 100%   BMI 38.41  kg/m   Physical Exam Constitutional:      Comments: Alert nontoxic.  No acute distress.  HENT:     Mouth/Throat:     Pharynx: Oropharynx is clear.  Cardiovascular:     Rate and Rhythm: Normal rate and regular rhythm.  Pulmonary:     Effort: Pulmonary effort is normal.     Breath sounds: Normal breath sounds.  Musculoskeletal:     Comments: Patient has some mild effusion of the right knee.  No erythema.  Significant degenerative thickening of the knee and valgus rotation.  Calf is soft and nontender.  Skin:    General: Skin is warm and dry.  Neurological:     General: No focal deficit present.     Mental Status: She is oriented to person, place, and time.  Psychiatric:        Mood and Affect: Mood normal.     (all labs ordered are listed, but only abnormal results are displayed) Labs Reviewed - No data to display  EKG: None  Radiology: No results found.   Procedures   Medications Ordered in the ED - No data to display                                  Medical Decision Making Amount and/or Complexity of Data Reviewed Radiology: ordered.  Risk Prescription drug management.   Patient presents as outlined.  Has a history of significant osteoarthritis.  At this time no signs of septic joint or acute injury.  Findings are consistent with a chronic osteoarthritis with exacerbation and mild knee effusion.  At this time we discussed conservative management with low-dose of as needed hydrocodone  to help with acute pain exacerbation.  Patient is scheduling follow-up with her orthopedic provider to discuss possibility of repeat joint injection.  At this time I do not feel that she emergently needs arthrocentesis or steroid injection of the knee.  Discharged in good condition with plan for conservative pain control and follow-up with orthopedics.     Final diagnoses:  Osteoarthritis of right knee, unspecified osteoarthritis type    ED Discharge Orders     None           Armenta Canning, MD 12/12/23 [phone]

## 2023-12-06 NOTE — ED Triage Notes (Signed)
 Pt reports that her right knee is giving her issues. States that she has arthritis. States her knee has been swelling over the last 3 days.

## 2023-12-10 ENCOUNTER — Ambulatory Visit (HOSPITAL_BASED_OUTPATIENT_CLINIC_OR_DEPARTMENT_OTHER): Admitting: Physician Assistant

## 2023-12-10 ENCOUNTER — Telehealth (HOSPITAL_BASED_OUTPATIENT_CLINIC_OR_DEPARTMENT_OTHER): Payer: Self-pay

## 2023-12-10 ENCOUNTER — Encounter (HOSPITAL_BASED_OUTPATIENT_CLINIC_OR_DEPARTMENT_OTHER): Payer: Self-pay | Admitting: Physician Assistant

## 2023-12-10 DIAGNOSIS — M170 Bilateral primary osteoarthritis of knee: Secondary | ICD-10-CM

## 2023-12-10 DIAGNOSIS — M1711 Unilateral primary osteoarthritis, right knee: Secondary | ICD-10-CM | POA: Diagnosis not present

## 2023-12-10 MED ORDER — LIDOCAINE HCL 1 % IJ SOLN
5.0000 mL | INTRAMUSCULAR | Status: AC | PRN
  Administered 2023-12-10 (×2): 5 mL

## 2023-12-10 MED ORDER — METHYLPREDNISOLONE ACETATE 40 MG/ML IJ SUSP
40.0000 mg | INTRAMUSCULAR | Status: AC | PRN
  Administered 2023-12-10 (×2): 40 mg via INTRA_ARTICULAR

## 2023-12-10 NOTE — Progress Notes (Signed)
 Office Visit Note   Patient: Mariah Mason           Date of Birth: 1950-09-13           MRN: [id number] Visit Date: 12/10/2023              Requested by: Jason Leita Repine, FNP [address] [place],  [place] [address] PCP: Jason Leita Repine, FNP  Chief Complaint  Patient presents with  . Right Knee - Pain      HPI: Mariah Mason is a pleasant 73 year old woman who is accompanied by her daughter.  She comes in today she has a history of end-stage valgus arthritis of her bilateral knees right worse than left.  She last had an steroid injection in April did not find it really helpful.  She is wearing a wrap on her knee.  She is not interested in knee replacement surgery.  Assessment & Plan: Visit Diagnoses:  1. Primary osteoarthritis of both knees     Plan: Patient has not gotten good results with steroid but it helps her some.  She is interested in viscosupplementation.  I explained to her and her daughter the side effects and how this works.  They like to go forward I also discussed about getting a more supportive knee sleeve for her to look into this This patient is diagnosed with osteoarthritis of the knee(s).    Radiographs show evidence of joint space narrowing, osteophytes, subchondral sclerosis and/or subchondral cysts.  This patient has knee pain which interferes with functional and activities of daily living.    This patient has experienced inadequate response, adverse effects and/or intolerance with conservative treatments such as acetaminophen , NSAIDS, topical creams, physical therapy or regular exercise, knee bracing and/or weight loss.   This patient has experienced inadequate response or has a contraindication to intra articular steroid injections for at least 3 months.   This patient is not scheduled to have a total knee replacement within 6 months of starting treatment with viscosupplementation.   Follow-Up Instructions: No follow-ups on  file.   Ortho Exam  Patient is alert, oriented, no adenopathy, well-dressed, normal affect, normal respiratory effort. Right knee no effusion no erythema compartments are soft and compressible she is neurovascular intact    Imaging: No results found. No images are attached to the encounter.  Labs: Lab Results  Component Value Date   HGBA1C 7.2 (H) 07/19/2023   HGBA1C 6.9 (H) 01/15/2023   HGBA1C 6.7 (H) 07/17/2022   ESRSEDRATE 51 (H) 08/02/2020   CRP 2 08/02/2020     Lab Results  Component Value Date   ALBUMIN 4.3 07/19/2023   ALBUMIN 4.1 01/15/2023   ALBUMIN 4.2 07/17/2022    Lab Results  Component Value Date   MG 2.3 04/07/2022   No results found for: VD25OH  No results found for: PREALBUMIN    Latest Ref Rng & Units 07/19/2023   10:13 AM 01/15/2023   10:55 AM 04/07/2022    8:30 PM  CBC EXTENDED  WBC 4.0 - 10.5 K/uL 5.1  4.8  6.4   RBC 3.87 - 5.11 Mil/uL 4.27  4.13  4.02   Hemoglobin 12.0 - 15.0 g/dL 88.4  88.6  89.0   HCT 36.0 - 46.0 % 36.1  34.8  33.9   Platelets 150.0 - 400.0 K/uL 235.0  249.0  244   NEUT# 1.4 - 7.7 K/uL 3.1  2.9  3.9   Lymph# 0.7 - 4.0 K/uL 1.5  1.4  1.9      There is no height or weight on file to calculate BMI.  Orders:  No orders of the defined types were placed in this encounter.  No orders of the defined types were placed in this encounter.    Procedures: Large Joint Inj: R knee on 12/10/2023 3:43 PM Indications: pain and diagnostic evaluation Details: 22 G 1.5 in needle, anteromedial approach  Arthrogram: No  Medications: 40 mg methylPREDNISolone  acetate 40 MG/ML; 5 mL lidocaine  1 % Outcome: tolerated well, no immediate complications Procedure, treatment alternatives, risks and benefits explained, specific risks discussed. Consent was given by the patient.     Clinical Data: No additional findings.  ROS:  All other systems negative, except as noted in the HPI. Review of Systems  Objective: Vital Signs:  There were no vitals taken for this visit.  Specialty Comments:  No specialty comments available.  PMFS History: Patient Active Problem List   Diagnosis Date Noted  . Osteoarthritis of knees, bilateral 12/04/2022  . Bilateral lumbar radiculopathy 08/22/2020  . DM type 2 with diabetic peripheral neuropathy (HCC) 08/02/2020  . Gait abnormality 08/02/2020  . Gout   . Hypertension   . Type 2 diabetes mellitus (HCC) 02/18/2019  . AKI (acute kidney injury) (HCC) 02/18/2019  . Hyperkalemia 02/18/2019  . Metabolic acidosis 02/18/2019  . Hypoglycemia 02/17/2019  . Long term current use of oral hypoglycemic drug 05/23/2017  . Presbyopia of both eyes 05/23/2017  . Vitreous floaters of both eyes 05/23/2017  . Keratoconjunctivitis sicca of both eyes not specified as Sjogren's 10/16/2016  . Nuclear sclerotic cataract of both eyes 10/16/2016   Past Medical History:  Diagnosis Date  . Acid reflux   . Diabetes mellitus without complication (HCC)   . Gout   . Hyperlipemia   . Hypertension   . Neuropathy     Family History  Problem Relation Age of Onset  . Heart attack Mother   . Kidney failure Father     Past Surgical History:  Procedure Laterality Date  . CYST EXCISION N/A 08/30/2020   Procedure: EXCISION 1X2 cm LEFT NECK SEBACEOUS CYST EXCISION 2X2 CM RIGHT BACK SEBACEOUS CYST;  Surgeon: Ebbie Cough, MD;  Location: [facility];  Service: General;  Laterality: N/A;  . THYROID SURGERY     Social History   Occupational History  . Occupation: Retired  Tobacco Use  . Smoking status: Never  . Smokeless tobacco: Never  Vaping Use  . Vaping status: Never Used  Substance and Sexual Activity  . Alcohol use: Never  . Drug use: Never  . Sexual activity: Not on file

## 2023-12-10 NOTE — Telephone Encounter (Signed)
 Hey need auth for Visco for right knee for this patient please.

## 2023-12-11 ENCOUNTER — Ambulatory Visit: Admitting: Podiatry

## 2023-12-17 NOTE — Telephone Encounter (Signed)
 VOB submitted for Monovisc, right knee

## 2023-12-31 ENCOUNTER — Telehealth: Payer: Self-pay

## 2023-12-31 NOTE — Telephone Encounter (Signed)
 Called patient and LVM to call the office and ask to speak to Mariah Mason or Mariah Mason to schedule for Monovisc injection

## 2024-01-02 ENCOUNTER — Telehealth: Payer: Self-pay

## 2024-01-02 NOTE — Telephone Encounter (Signed)
 Pt's daughter called stating that the pt is having a flare up of neuropathy and was wondering if this would effect her with getting the gel injection. Please advise

## 2024-01-03 NOTE — Telephone Encounter (Signed)
 No, patient's daughter stated that she would call me back to schedule due to needing a certain day.  Thank you

## 2024-01-07 ENCOUNTER — Ambulatory Visit: Admitting: Podiatry

## 2024-01-07 DIAGNOSIS — B351 Tinea unguium: Secondary | ICD-10-CM

## 2024-01-07 DIAGNOSIS — E1142 Type 2 diabetes mellitus with diabetic polyneuropathy: Secondary | ICD-10-CM | POA: Diagnosis not present

## 2024-01-07 DIAGNOSIS — M79609 Pain in unspecified limb: Secondary | ICD-10-CM | POA: Diagnosis not present

## 2024-01-10 ENCOUNTER — Ambulatory Visit (HOSPITAL_COMMUNITY)
Admission: RE | Admit: 2024-01-10 | Discharge: 2024-01-10 | Disposition: A | Discharge status: Home / Self Care | Source: Ambulatory Visit | Attending: Podiatry | Admitting: Podiatry

## 2024-01-10 DIAGNOSIS — E1142 Type 2 diabetes mellitus with diabetic polyneuropathy: Secondary | ICD-10-CM | POA: Diagnosis present

## 2024-01-11 LAB — VAS US ABI WITH/WO TBI
Left ABI: 0.9
Right ABI: 0.86

## 2024-01-12 ENCOUNTER — Encounter: Payer: Self-pay | Admitting: Podiatry

## 2024-01-12 NOTE — Progress Notes (Signed)
  Subjective:  Patient ID: Mariah Mason, female    DOB: 16-Mar-1951,  MRN: [id number]  Mariah Mason presents to clinic today for at risk foot care with history of diabetic neuropathy and painful thick toenails that are difficult to trim. Pain interferes with ambulation. Aggravating factors include wearing enclosed shoe gear. Pain is relieved with periodic professional debridement.  Chief Complaint  Patient presents with   Nail Problem    Thick painful toenails, 3 month follow up    PCP is Jason Leita Repine, FNP. Mariah Mason 07/19/2023.  Allergies  Allergen Reactions   Hydrochlorothiazide     Other Reaction(s): dizziness   Erythromycin Other (See Comments)    Does not remember reaction.   Hydralazine  Other (See Comments)    Not able to tolerate per patient; caused her to feel dizzy    Review of Systems: Negative except as noted in the HPI.  Objective: No changes noted in today's physical examination. There were no vitals filed for this visit. Mariah Mason is a pleasant 73 y.o. female in NAD. AAO x 3.  Vascular Examination: CFT <4 seconds b/l. DP pulses diminished b/l. PT pulses palpable b/l. Digital hair absent. Skin temperature gradient warm to cool b/l. No ischemia or gangrene. No cyanosis or clubbing noted b/l. Trace edema noted BLE.   Neurological Examination: Sensation grossly intact b/l with 10 gram monofilament. Vibratory sensation intact b/l. Pt has subjective symptoms of neuropathy.  Dermatological Examination: Pedal skin thin, shiny and atrophic b/l. No open wounds. No interdigital macerations.   Toenails 1-5 b/l thick, discolored, elongated with subungual debris and pain on dorsal palpation.   No hyperkeratotic nor porokeratotic lesions present on today's visit.  Musculoskeletal Examination: Muscle strength 5/5 to all lower extremity muscle groups bilaterally. HAV with bunion deformity noted b/l LE. Hammertoe deformity noted 2-5 b/l.  Radiographs:  None  Assessment/Plan: 1. Pain due to onychomycosis of nail   2. Diabetic peripheral neuropathy associated with type 2 diabetes mellitus (HCC)    Orders Placed This Encounter  Procedures   VAS US  ABI WITH/WO TBI  -Patient was evaluated today. All questions/concerns addressed on today's visit. -Due to risk factor(s), ordered noninvasive arterial studies ABIs with and without TBIs for b/l lower extremities. -Patient to continue soft, supportive shoe gear daily. -Toenails 1-5 b/l were debrided in length and girth with sterile nail nippers and dremel without iatrogenic bleeding.  -Patient/POA to call should there be question/concern in the interim.   Return in about 3 months (around 04/07/2024).  Delon LITTIE Merlin, DPM      [place] LOCATION: [address], [place] [address]                   Office [phone]   [facility] LOCATION: [address] [place], [place] [address] Office [phone]

## 2024-01-21 LAB — HM MAMMOGRAPHY

## 2024-01-24 ENCOUNTER — Ambulatory Visit: Admitting: Family

## 2024-01-27 DIAGNOSIS — E559 Vitamin D deficiency, unspecified: Secondary | ICD-10-CM | POA: Insufficient documentation

## 2024-01-27 DIAGNOSIS — Z8739 Personal history of other diseases of the musculoskeletal system and connective tissue: Secondary | ICD-10-CM | POA: Insufficient documentation

## 2024-01-27 NOTE — Assessment & Plan Note (Signed)
 Supplement and monitor

## 2024-01-27 NOTE — Assessment & Plan Note (Signed)
 Well controlled, no changes to meds. Encouraged heart healthy diet such as the DASH diet and exercise as tolerated.

## 2024-01-27 NOTE — Assessment & Plan Note (Signed)
Denies recent flares.

## 2024-01-27 NOTE — Assessment & Plan Note (Signed)
 hgba1c acceptable, minimize simple carbs. Increase exercise as tolerated. Continue current meds

## 2024-01-27 NOTE — Progress Notes (Unsigned)
 Subjective:     Patient ID: Mariah Mason, female    DOB: Jun 03, 1950, 73 y.o.   MRN: [id number]  No chief complaint on file.   HPI  Discussed the use of AI scribe software for clinical note transcription with the patient, who gave verbal consent to proceed.  Follows with Podiatry, Orthopedics, Opthamoloy, Vascular surgery  PMHx- Hx GOUT, OA b/l knees,DM Type 2 with peripheral neuropathy, Nephrology  Nephrology- who?***  Recent has ABI index on 01/10/2024 with vascular surgery and indicated bilateral lower extremity mild arterial disease.  Right toe-brachial index is abnormal, left toe brachial index is abnormal.    Hypertension: - Medications: amlodipine  10 mg, carvedilol  25 mg, clonidine  0.1 mg HS, valsartan  80 mg - Compliance: *** - Checking BP at home: *** - Denies any SOB, recurrent headaches, CP, vision changes, LE edema, dizziness, palpitations, or medication side effects. - Diet: *** - Exercise: *** - Stressors:   DM Type 2 Meds: dapaglifozin (Farxiga ) 5 mg, sitaglipton (Januvia ) 50 mg  HLD Atorvastatin  20 mg   OA b/l knees- follows with Orthopedics Receieves knee injections, last injection ***  Chronic Pain- Norco-5-325 mg??***  Patient denies fever, chills, SOB, CP, palpitations, dyspnea, edema, HA, vision changes, N/V/D, abdominal pain, urinary symptoms, rash, weight changes, and recent illness or hospitalizations.   History of Present Illness              Health Maintenance Due  Topic Date Due   DTaP/Tdap/Td (1 - Tdap) Never done   Zoster Vaccines- Shingrix (1 of 2) Never done   Diabetic kidney evaluation - Urine ACR  06/29/2020   Influenza Vaccine  11/29/2023   HEMOGLOBIN A1C  01/19/2024    Past Medical History:  Diagnosis Date   Acid reflux    Diabetes mellitus without complication (HCC)    Gout    Hyperlipemia    Hypertension    Neuropathy     Past Surgical History:  Procedure Laterality Date   CYST EXCISION N/A 08/30/2020    Procedure: EXCISION 1X2 cm LEFT NECK SEBACEOUS CYST EXCISION 2X2 CM RIGHT BACK SEBACEOUS CYST;  Surgeon: Ebbie Cough, MD;  Location: [facility];  Service: General;  Laterality: N/A;   THYROID SURGERY      Family History  Problem Relation Age of Onset   Heart attack Mother    Kidney failure Father     Social History   Socioeconomic History   Marital status: Single    Spouse name: Not on file   Number of children: 6   Years of education: 9th grade   Highest education level: Not on file  Occupational History   Occupation: Retired  Tobacco Use   Smoking status: Never   Smokeless tobacco: Never  Vaping Use   Vaping status: Never Used  Substance and Sexual Activity   Alcohol use: Never   Drug use: Never   Sexual activity: Not on file  Other Topics Concern   Not on file  Social History Narrative   Lives with family.   Right-handed.   No daily use of caffeine.   Social Drivers of Corporate investment banker Strain: Low Risk  (11/27/2023)   Overall Financial Resource Strain (CARDIA)    Difficulty of Paying Living Expenses: Not hard at all  Food Insecurity: No Food Insecurity (11/27/2023)   Hunger Vital Sign    Worried About Running Out of Food in the Last Year: Never true    Ran Out of Food in the Last  Year: Never true  Transportation Needs: No Transportation Needs (11/27/2023)   PRAPARE - Administrator, Civil Service (Medical): No    Lack of Transportation (Non-Medical): No  Physical Activity: Sufficiently Active (11/27/2023)   Exercise Vital Sign    Days of Exercise per Week: 6 days    Minutes of Exercise per Session: 30 min  Stress: No Stress Concern Present (11/27/2023)   [employer] of Occupational Health - Occupational Stress Questionnaire    Feeling of Stress: Not at all  Social Connections: Moderately Integrated (11/27/2023)   Social Connection and Isolation Panel    Frequency of Communication with Friends and Family: More  than three times a week    Frequency of Social Gatherings with Friends and Family: More than three times a week    Attends Religious Services: More than 4 times per year    Active Member of [employer] or Organizations: Yes    Attends Engineer, structural: More than 4 times per year    Marital Status: Never married  Intimate Partner Violence: Not At Risk (11/27/2023)   Humiliation, Afraid, Rape, and Kick questionnaire    Fear of Current or Ex-Partner: No    Emotionally Abused: No    Physically Abused: No    Sexually Abused: No    Outpatient Medications Prior to Visit  Medication Sig Dispense Refill   ACCU-CHEK GUIDE TEST test strip CHECK BLOOD SUGAR ONCE DAILY 100 strip 3   Accu-Chek Softclix Lancets lancets 4 (four) times daily.     allopurinol  (ZYLOPRIM ) 100 MG tablet Take 1 tablet (100 mg total) by mouth daily. 90 tablet 1   amLODipine  (NORVASC ) 10 MG tablet TAKE 1 TABLET(10 MG) BY MOUTH DAILY 90 tablet 3   ammonium lactate  (LAC-HYDRIN ) 12 % lotion APPLY TOPICALLY TO THE SKIN DAILY AS NEEDED FOR DRY SKIN 400 g 5   atorvastatin  (LIPITOR) 20 MG tablet TAKE 1 TABLET(20 MG) BY MOUTH DAILY 90 tablet 3   blood glucose meter kit and supplies KIT Dispense based on patient and insurance preference. Use up to four times daily as directed. (FOR ICD-9 250.00, 250.01). 1 each 0   carvedilol  (COREG ) 25 MG tablet TAKE 1 TABLET(25 MG) BY MOUTH TWICE DAILY WITH A MEAL 180 tablet 3   cholecalciferol (VITAMIN D3) 25 MCG (1000 UT) tablet Take 1,000 Units by mouth daily.     cloNIDine  (CATAPRES ) 0.1 MG tablet TAKE 1 TABLET(0.1 MG) BY MOUTH EVERY EVENING 90 tablet 1   colchicine  0.6 MG tablet Take 2 tablets (1.2mg ) by mouth at onset for gout, may repeat 1 tablet 0.6mg  in 2 hours is symptoms persist 30 tablet 1   cycloSPORINE  (RESTASIS ) 0.05 % ophthalmic emulsion Place 1 drop into both eyes 2 (two) times daily.      dapagliflozin  propanediol (FARXIGA ) 5 MG TABS tablet Take 1 tablet (5 mg total) by mouth  daily. 90 tablet 3   Elastic Bandages & Supports (MEDICAL COMPRESSION SOCKS) MISC Compression socks for DM peripheral neuropathy and bilateral leg swelling 2 each 3   ferrous sulfate 325 (65 FE) MG tablet Take 325 mg by mouth daily with breakfast.     HYDROcodone -acetaminophen  (NORCO/VICODIN) 5-325 MG tablet Take 1 tablet by mouth every 6 (six) hours as needed. 15 tablet 0   Misc. Devices MISC Shower chair. Dx- Diabetes mellitus 1 each 0   Multiple Vitamins-Minerals (CENTRUM SILVER ADULT 50+ PO) Take 1 tablet by mouth daily.     NONFORMULARY OR COMPOUNDED ITEM Peripheral Neuropathy Cream:  Bupivacaine  1%, Doxepin 3%, Gabapentin  6%, Pentoxifylline 3%, Topiramate 1% Order faxed to [facility] 1 each 3   sitaGLIPtin  (JANUVIA ) 50 MG tablet Take 1 tablet (50 mg total) by mouth daily. 90 tablet 1   sodium bicarbonate  650 MG tablet Take 650 mg by mouth 2 (two) times daily.     valsartan  (DIOVAN ) 80 MG tablet Take 80 mg by mouth daily.     No facility-administered medications prior to visit.    Allergies  Allergen Reactions   Hydrochlorothiazide     Other Reaction(s): dizziness   Erythromycin Other (See Comments)    Does not remember reaction.   Hydralazine  Other (See Comments)    Not able to tolerate per patient; caused her to feel dizzy    ROS    See HPI Objective:    Physical Exam  General: No acute distress. Awake and conversant.  Eyes: Normal conjunctiva, anicteric. Round symmetric pupils.  Respiratory: CTAB. Respirations are non-labored. No wheezing.  Skin: Warm. No rashes or ulcers.  Psych: Alert and oriented. Cooperative, Appropriate mood and affect, Normal judgment.  CV: RRR. No murmur. No lower extremity edema.  MSK: Normal ambulation. No clubbing or cyanosis.  Neuro:  CN II-XII grossly normal.    There were no vitals taken for this visit. Wt Readings from Last 3 Encounters:  12/06/23 210 lb (95.3 kg)  11/27/23 206 lb (93.4 kg)  09/03/23 203 lb 12.8 oz (92.4 kg)        Assessment & Plan:   Problem List Items Addressed This Visit     DM type 2 with diabetic peripheral neuropathy (HCC) - Primary   hgba1c acceptable, minimize simple carbs. Increase exercise as tolerated. Continue current meds        History of gout   Denies recent flares.       Hypertension   Well controlled, no changes to meds. Encouraged heart healthy diet such as the DASH diet and exercise as tolerated.        Vitamin D deficiency, unspecified   Supplement and monitor.       I am having Alyzae Yorio maintain her Restasis , Multiple Vitamins-Minerals (CENTRUM SILVER ADULT 50+ PO), cholecalciferol, Misc. Devices, NONFORMULARY OR COMPOUNDED ITEM, Medical Compression Socks, blood glucose meter kit and supplies, Accu-Chek Softclix Lancets, colchicine , cloNIDine , amLODipine , Accu-Chek Guide Test, carvedilol , atorvastatin , ammonium lactate , valsartan , dapagliflozin  propanediol, sitaGLIPtin , allopurinol , ferrous sulfate, sodium bicarbonate , and HYDROcodone -acetaminophen .  No orders of the defined types were placed in this encounter.

## 2024-01-28 ENCOUNTER — Encounter: Payer: Self-pay | Admitting: Student

## 2024-01-28 ENCOUNTER — Ambulatory Visit: Admitting: Student

## 2024-01-28 ENCOUNTER — Telehealth: Payer: Self-pay | Admitting: Student

## 2024-01-28 VITALS — BP 138/68 | HR 85 | Temp 98.7°F | Resp 12 | Ht 62.0 in | Wt 206.0 lb

## 2024-01-28 DIAGNOSIS — N289 Disorder of kidney and ureter, unspecified: Secondary | ICD-10-CM

## 2024-01-28 DIAGNOSIS — I10 Essential (primary) hypertension: Secondary | ICD-10-CM | POA: Diagnosis not present

## 2024-01-28 DIAGNOSIS — M170 Bilateral primary osteoarthritis of knee: Secondary | ICD-10-CM

## 2024-01-28 DIAGNOSIS — E785 Hyperlipidemia, unspecified: Secondary | ICD-10-CM | POA: Diagnosis not present

## 2024-01-28 DIAGNOSIS — D631 Anemia in chronic kidney disease: Secondary | ICD-10-CM

## 2024-01-28 DIAGNOSIS — E559 Vitamin D deficiency, unspecified: Secondary | ICD-10-CM | POA: Diagnosis not present

## 2024-01-28 DIAGNOSIS — G629 Polyneuropathy, unspecified: Secondary | ICD-10-CM | POA: Diagnosis not present

## 2024-01-28 DIAGNOSIS — Z8739 Personal history of other diseases of the musculoskeletal system and connective tissue: Secondary | ICD-10-CM | POA: Diagnosis not present

## 2024-01-28 DIAGNOSIS — E1142 Type 2 diabetes mellitus with diabetic polyneuropathy: Secondary | ICD-10-CM

## 2024-01-28 DIAGNOSIS — Z23 Encounter for immunization: Secondary | ICD-10-CM

## 2024-01-28 DIAGNOSIS — N184 Chronic kidney disease, stage 4 (severe): Secondary | ICD-10-CM

## 2024-01-28 DIAGNOSIS — Z789 Other specified health status: Secondary | ICD-10-CM

## 2024-01-28 LAB — IRON,TIBC AND FERRITIN PANEL
%SAT: 17 % (ref 16–45)
Ferritin: 65 ng/mL (ref 16–288)
Iron: 41 ug/dL — ABNORMAL LOW (ref 45–160)
TIBC: 247 ug/dL — ABNORMAL LOW (ref 250–450)

## 2024-01-28 NOTE — Assessment & Plan Note (Signed)
 Osteoarthritis of the knee with previous cortisone injections. Follows with Ortho. Gel injections approved as next step. - Continue Tylenol  for pain management. - Use over-the-counter nerve cream as needed.

## 2024-01-28 NOTE — Telephone Encounter (Signed)
 Called patient for redraw, will come into today.

## 2024-01-28 NOTE — Assessment & Plan Note (Addendum)
 Hydrate and monitor. Follows with Nephrology- Dr. Jerrye, [facility].

## 2024-01-29 LAB — CBC WITH DIFFERENTIAL/PLATELET
Basophils Absolute: 0 K/uL (ref 0.0–0.1)
Basophils Relative: 0.5 % (ref 0.0–3.0)
Eosinophils Absolute: 0.2 K/uL (ref 0.0–0.7)
Eosinophils Relative: 2.9 % (ref 0.0–5.0)
HCT: 36.3 % (ref 36.0–46.0)
Hemoglobin: 11.8 g/dL — ABNORMAL LOW (ref 12.0–15.0)
Lymphocytes Relative: 27.4 % (ref 12.0–46.0)
Lymphs Abs: 1.5 K/uL (ref 0.7–4.0)
MCHC: 32.4 g/dL (ref 30.0–36.0)
MCV: 84.1 fl (ref 78.0–100.0)
Monocytes Absolute: 0.4 K/uL (ref 0.1–1.0)
Monocytes Relative: 6.6 % (ref 3.0–12.0)
Neutro Abs: 3.5 K/uL (ref 1.4–7.7)
Neutrophils Relative %: 62.6 % (ref 43.0–77.0)
Platelets: 237 K/uL (ref 150.0–400.0)
RBC: 4.31 Mil/uL (ref 3.87–5.11)
RDW: 13.7 % (ref 11.5–15.5)
WBC: 5.5 K/uL (ref 4.0–10.5)

## 2024-01-29 LAB — COMPREHENSIVE METABOLIC PANEL WITH GFR
ALT: 9 U/L (ref 0–35)
AST: 14 U/L (ref 0–37)
Albumin: 4.5 g/dL (ref 3.5–5.2)
Alkaline Phosphatase: 61 U/L (ref 39–117)
BUN: 32 mg/dL — ABNORMAL HIGH (ref 6–23)
CO2: 26 meq/L (ref 19–32)
Calcium: 9.6 mg/dL (ref 8.4–10.5)
Chloride: 107 meq/L (ref 96–112)
Creatinine, Ser: 1.77 mg/dL — ABNORMAL HIGH (ref 0.40–1.20)
GFR: 28.08 mL/min — ABNORMAL LOW (ref >60.00)
Glucose, Bld: 124 mg/dL — ABNORMAL HIGH (ref 70–99)
Potassium: 4.2 meq/L (ref 3.5–5.1)
Sodium: 140 meq/L (ref 135–145)
Total Bilirubin: 0.4 mg/dL (ref 0.2–1.2)
Total Protein: 7.4 g/dL (ref 6.0–8.3)

## 2024-01-29 LAB — MICROALBUMIN / CREATININE URINE RATIO
Creatinine,U: 50.9 mg/dL
Microalb Creat Ratio: 842.6 mg/g — ABNORMAL HIGH (ref 0.0–30.0)
Microalb, Ur: 42.8 mg/dL — ABNORMAL HIGH (ref 0.0–1.9)

## 2024-01-29 LAB — LIPID PANEL
Cholesterol: 198 mg/dL (ref 0–200)
HDL: 32.9 mg/dL — ABNORMAL LOW (ref >39.00)
LDL Cholesterol: 115 mg/dL — ABNORMAL HIGH (ref 0–99)
NonHDL: 165.29
Total CHOL/HDL Ratio: 6
Triglycerides: 250 mg/dL — ABNORMAL HIGH (ref 0.0–149.0)
VLDL: 50 mg/dL — ABNORMAL HIGH (ref 0.0–40.0)

## 2024-01-29 LAB — HEMOGLOBIN A1C: Hgb A1c MFr Bld: 7.5 % — ABNORMAL HIGH (ref 4.6–6.5)

## 2024-01-29 LAB — VITAMIN D 25 HYDROXY (VIT D DEFICIENCY, FRACTURES): VITD: 53.39 ng/mL (ref 30.00–100.00)

## 2024-01-29 LAB — VITAMIN B12: Vitamin B-12: 1103 pg/mL — ABNORMAL HIGH (ref 211–911)

## 2024-01-30 ENCOUNTER — Ambulatory Visit: Payer: Self-pay | Admitting: Student

## 2024-01-30 DIAGNOSIS — E785 Hyperlipidemia, unspecified: Secondary | ICD-10-CM

## 2024-01-30 MED ORDER — ATORVASTATIN CALCIUM 40 MG PO TABS: 40.0000 mg | ORAL_TABLET | Freq: Every day | ORAL | 1 refills | Status: DC

## 2024-02-03 ENCOUNTER — Ambulatory Visit: Payer: Self-pay | Admitting: Student

## 2024-02-03 ENCOUNTER — Encounter: Payer: Self-pay | Admitting: Student

## 2024-02-05 ENCOUNTER — Encounter (HOSPITAL_BASED_OUTPATIENT_CLINIC_OR_DEPARTMENT_OTHER): Payer: Self-pay | Admitting: Emergency Medicine

## 2024-02-05 ENCOUNTER — Emergency Department (HOSPITAL_BASED_OUTPATIENT_CLINIC_OR_DEPARTMENT_OTHER): Admission: EM | Admit: 2024-02-05 | Discharge: 2024-02-05 | Disposition: A | Discharge status: Home / Self Care

## 2024-02-05 ENCOUNTER — Other Ambulatory Visit: Payer: Self-pay

## 2024-02-05 DIAGNOSIS — E785 Hyperlipidemia, unspecified: Secondary | ICD-10-CM

## 2024-02-05 DIAGNOSIS — R42 Dizziness and giddiness: Secondary | ICD-10-CM | POA: Diagnosis present

## 2024-02-05 LAB — CBC WITH DIFFERENTIAL/PLATELET
Abs Immature Granulocytes: 0.02 K/uL (ref 0.00–0.07)
Basophils Absolute: 0 K/uL (ref 0.0–0.1)
Basophils Relative: 0 %
Eosinophils Absolute: 0.1 K/uL (ref 0.0–0.5)
Eosinophils Relative: 2 %
HCT: 36.2 % (ref 36.0–46.0)
Hemoglobin: 11.8 g/dL — ABNORMAL LOW (ref 12.0–15.0)
Immature Granulocytes: 0 %
Lymphocytes Relative: 26 %
Lymphs Abs: 1.3 K/uL (ref 0.7–4.0)
MCH: 27.1 pg (ref 26.0–34.0)
MCHC: 32.6 g/dL (ref 30.0–36.0)
MCV: 83.2 fL (ref 80.0–100.0)
Monocytes Absolute: 0.3 K/uL (ref 0.1–1.0)
Monocytes Relative: 6 %
Neutro Abs: 3.2 K/uL (ref 1.7–7.7)
Neutrophils Relative %: 66 %
Platelets: 239 K/uL (ref 150–400)
RBC: 4.35 MIL/uL (ref 3.87–5.11)
RDW: 12.5 % (ref 11.5–15.5)
WBC: 4.8 K/uL (ref 4.0–10.5)
nRBC: 0 % (ref 0.0–0.2)

## 2024-02-05 LAB — COMPREHENSIVE METABOLIC PANEL WITH GFR
ALT: 11 U/L (ref 0–44)
AST: 15 U/L (ref 15–41)
Albumin: 4.5 g/dL (ref 3.5–5.0)
Alkaline Phosphatase: 66 U/L (ref 38–126)
Anion gap: 13 (ref 5–15)
BUN: 34 mg/dL — ABNORMAL HIGH (ref 8–23)
CO2: 24 mmol/L (ref 22–32)
Calcium: 9.6 mg/dL (ref 8.9–10.3)
Chloride: 102 mmol/L (ref 98–111)
Creatinine, Ser: 1.82 mg/dL — ABNORMAL HIGH (ref 0.44–1.00)
GFR, Estimated: 29 mL/min — ABNORMAL LOW (ref >60)
Glucose, Bld: 276 mg/dL — ABNORMAL HIGH (ref 70–99)
Potassium: 4.7 mmol/L (ref 3.5–5.1)
Sodium: 140 mmol/L (ref 135–145)
Total Bilirubin: 0.4 mg/dL (ref 0.0–1.2)
Total Protein: 7.5 g/dL (ref 6.5–8.1)

## 2024-02-05 NOTE — ED Notes (Signed)
 Pt alert and oriented X 4 at the time of discharge. RR even and unlabored. No acute distress noted. Pt verbalized understanding of discharge instructions as discussed. Pt ambulatory to lobby at time of discharge.

## 2024-02-05 NOTE — ED Triage Notes (Signed)
 Reports dizziness x 2 days , increased her cholesterol meds 2 days ago . Feet neuropathy pain been worse she said .  Denies headache or vision changes .  Pt wheeled to triage .  Alert and oriented x 4 .

## 2024-02-05 NOTE — ED Provider Notes (Signed)
 [place] [facility] Provider Note   CSN: [id number] Arrival date & time: 02/05/24  9064     Patient presents with: Dizziness   Mariah Mason is a 73 y.o. female.    Dizziness     Patient presents because of vertigo.  Patient states that he recently whenever she had her atorvastatin  increased from 20 mg to 40 mg, she states that after every time she takes that she becomes vertiginous.  Feels somewhat dizzy.  Patient states that it feels like the room is spinning whenever move her head back-and-forth.  Subsequently resolved after some time.  Patient denies all complaints at this time.  No numbness or tingling aware.  No vertigo.  No diplopia.  No numbness or tingling.  No difficulty walking.  Patient also endorsing bilateral lower extremity pain in setting of her neuropathy.  Feels like he gets worse whenever she takes her increased dose of atorvastatin  as well.  Previous medical history reviewed : Patient last seen in the ED on August 8 because of knee pain.  Unremarkable exam.  Last followed up at primary care office on January 28, 2024.  Endorsed neuropathy at that time including pins-and-needles in her feet.  Sometimes expanding up her legs.  Gabapentin  was not well-tolerated due to sedation.  Prior to Admission medications   Medication Sig Start Date End Date Taking? Authorizing Provider  ACCU-CHEK GUIDE TEST test strip CHECK BLOOD SUGAR ONCE DAILY 05/06/23   Jason Leita Repine, FNP  Accu-Chek Softclix Lancets lancets 4 (four) times daily. 04/27/21   [provider]  allopurinol  (ZYLOPRIM ) 100 MG tablet Take 1 tablet (100 mg total) by mouth daily. 11/25/23   Jason Leita Repine, FNP  amLODipine  (NORVASC ) 10 MG tablet TAKE 1 TABLET(10 MG) BY MOUTH DAILY 03/01/23   Jason Leita Repine, FNP  ammonium lactate  (LAC-HYDRIN ) 12 % lotion APPLY TOPICALLY TO THE SKIN DAILY AS NEEDED FOR DRY SKIN 06/03/23   Gaynel Delon CROME, DPM   atorvastatin  (LIPITOR) 40 MG tablet Take 1 tablet (40 mg total) by mouth daily. 01/30/24   Wheeler Harlene CROME, NP  blood glucose meter kit and supplies KIT Dispense based on patient and insurance preference. Use up to four times daily as directed. (FOR ICD-9 250.00, 250.01). 03/28/21   Jason Leita Repine, FNP  carvedilol  (COREG ) 25 MG tablet TAKE 1 TABLET(25 MG) BY MOUTH TWICE DAILY WITH A MEAL 05/30/23   Jason Leita Repine, FNP  cholecalciferol (VITAMIN D3) 25 MCG (1000 UT) tablet Take 1,000 Units by mouth daily.    [provider]  cloNIDine  (CATAPRES ) 0.1 MG tablet TAKE 1 TABLET(0.1 MG) BY MOUTH EVERY EVENING 12/29/21   Jason Leita Repine, FNP  colchicine  0.6 MG tablet Take 2 tablets (1.2mg ) by mouth at onset for gout, may repeat 1 tablet 0.6mg  in 2 hours is symptoms persist 09/26/21   Jason Leita Repine, FNP  cycloSPORINE  (RESTASIS ) 0.05 % ophthalmic emulsion Place 1 drop into both eyes 2 (two) times daily.  12/05/18   [provider]  dapagliflozin  propanediol (FARXIGA ) 5 MG TABS tablet Take 1 tablet (5 mg total) by mouth daily. 07/26/23   Jason Leita Repine, FNP  Elastic Bandages & Supports (MEDICAL COMPRESSION SOCKS) MISC Compression socks for DM peripheral neuropathy and bilateral leg swelling 08/02/20   Onita Duos, MD  ferrous sulfate 325 (65 FE) MG tablet Take 325 mg by mouth daily with breakfast.    [provider]  HYDROcodone -acetaminophen  (NORCO/VICODIN) 5-325 MG tablet Take 1 tablet  by mouth every 6 (six) hours as needed. 12/06/23   Armenta Canning, MD  Misc. Devices MISC Shower chair. Dx- Diabetes mellitus 08/12/19   Delbert Clam, MD  Multiple Vitamins-Minerals (CENTRUM SILVER ADULT 50+ PO) Take 1 tablet by mouth daily.    [provider]  NONFORMULARY OR COMPOUNDED ITEM Peripheral Neuropathy Cream: Bupivacaine  1%, Doxepin 3%, Gabapentin  6%, Pentoxifylline 3%, Topiramate 1% Order faxed to [facility] 10/05/19   Gaynel Delon CROME,  DPM  sitaGLIPtin  (JANUVIA ) 50 MG tablet Take 1 tablet (50 mg total) by mouth daily. 08/27/23   Jason Leita Repine, FNP  sodium bicarbonate  650 MG tablet Take 650 mg by mouth 2 (two) times daily.    [provider]  valsartan  (DIOVAN ) 80 MG tablet Take 80 mg by mouth daily.    [provider]    Allergies: Hydrochlorothiazide, Erythromycin, and Hydralazine     Review of Systems  Neurological:  Positive for dizziness.    Updated Vital Signs BP (!) 153/67 (BP Location: Left Arm)   Pulse 83   Temp 98.2 F (36.8 C) (Oral)   Resp 18   Wt 93.4 kg   SpO2 99%   BMI 37.68 kg/m   Physical Exam  (all labs ordered are listed, but only abnormal results are displayed) Labs Reviewed  COMPREHENSIVE METABOLIC PANEL WITH GFR - Abnormal; Notable for the following components:      Result Value   Glucose, Bld 276 (*)    BUN 34 (*)    Creatinine, Ser 1.82 (*)    GFR, Estimated 29 (*)    All other components within normal limits  CBC WITH DIFFERENTIAL/PLATELET - Abnormal; Notable for the following components:   Hemoglobin 11.8 (*)    All other components within normal limits    EKG: EKG Interpretation Date/Time:  Wednesday February 05 2024 09:48:41 EDT Ventricular Rate:  83 PR Interval:  169 QRS Duration:  86 QT Interval:  368 QTC Calculation: 433 R Axis:   37  Text Interpretation: Sinus rhythm Low voltage, precordial leads Confirmed by Simon Rea [phone]) on 02/05/2024 9:50:36 AM  Radiology: No results found.   Procedures   Medications Ordered in the ED - No data to display                                  Medical Decision Making Amount and/or Complexity of Data Reviewed Labs: ordered.     HPI:     Patient presents because of vertigo.  Patient states that he recently whenever she had her atorvastatin  increased from 20 mg to 40 mg, she states that after every time she takes that she becomes vertiginous.  Feels somewhat dizzy.  Patient states that it  feels like the room is spinning whenever move her head back-and-forth.  Subsequently resolved after some time.  Patient denies all complaints at this time.  No numbness or tingling aware.  No vertigo.  No diplopia.  No numbness or tingling.  No difficulty walking.  Patient also endorsing bilateral lower extremity pain in setting of her neuropathy.  Feels like he gets worse whenever she takes her increased dose of atorvastatin  as well.  Previous medical history reviewed : Patient last seen in the ED on August 8 because of knee pain.  Unremarkable exam.  Last followed up at primary care office on January 28, 2024.  Endorsed neuropathy at that time including pins-and-needles in her feet.  Sometimes expanding up  her legs.  Gabapentin  was not well-tolerated due to sedation.  MDM:   Upon exam, patient alert oriented x 3 GCS 15.  No focal deficits.  Cranial nerves II through XII intact.  Patient had horizontal nystagmus.  Maybe slight horizontal saccade but test of skew negative.  No rotary or vertical nystagmus.  Normal finger-nose.  Romberg negative.  Normal heel-to-shin.  Strength and sensation intact in bilateral upper and lower extremities.  NIH is 0.   No concern for CVA at this point time.  No indication for any kind of neuroimaging.  No indication for CT scan or MRI scans.  No concerns for  subarachnoid.  Asymptomatic.   I did obtain laboratory workup to assess for any kind of large electrolyte derangements.  No large electrolyte derangements.  Sodium normal 140.  Potassium normal.  Patient's creatinine is 1.82 but around patient's baseline.  Otherwise, no anemia or any other lab derangements that explains patient's vertigo.  No indication for CT imaging.  No concerns for subdural epidural.  No concern for CVA at this time.   Rechecked the patient.  Remained neurologically intact.    Patient instructed to decrease atorvastatin  back down to 20 mg and follow-up with PCP.  Will see if this helps  her symptoms.    EKG Interpreted by Me: NSR   Cardiac Tele Interpreted by Me: NSR   Social Determinant of Health: None    Disposition and Follow Up: PCP, Discharge.       Final diagnoses:  Dizziness  Vertigo    ED Discharge Orders     None          Simon Lavonia SAILOR, MD 02/05/24 1104

## 2024-02-05 NOTE — Discharge Instructions (Addendum)
 I think decrease your atorvastatin  back down to 20 mg a follow back up with your PCP is the best plan going forward.  This vertigo that you are experiencing after you take this medication is not the most common side effect but given that your vertigo is only present whenever you take this medication I think decreasing back down to 20 mg is appropriate.  If you ever experience any kind of numbness or tingling or vision changes or difficulty speaking or confusion at the same time as this vertigo then please come back to the ED immediately.

## 2024-02-07 NOTE — Progress Notes (Signed)
 Acute Office Visit  Subjective:     Patient ID: Mariah Mason, female    DOB: [birth year], 73 y.o.   MRN: [id number]  Chief Complaint  Patient presents with   ER follow up    HPI Patient is in today for follow-up.  History of Present Illness Mariah Mason is a 73 year old female with diabetes and neuropathy who presents with worsening neuropathy symptoms. She is accompanied by her daughter. Recently went to the emergency department with episode of dizziness.  Patient feels the increase in atorvastatin  contributed to her symptoms, she is not back to 20 mg atorvastatin . Dizziness has improved.  She experiences a pulling sensation in her legs and feet. Gabapentin  was previously used but discontinued due to sedation. She currently uses medicated compression stockings for symptom relief. Her blood sugar levels range from 120 to 130 in the mornings.   She has CKD and is under the care of a nephrologist at [facility].    Patient denies fever, chills, SOB, CP, palpitations, dyspnea, edema, HA, vision changes, N/V/D, abdominal pain, urinary symptoms, rash, weight changes.  ROS  See HPI    Objective:    BP 136/70 (BP Location: Left Arm, Patient Position: Sitting, Cuff Size: Large)   Pulse 80   Temp 98.7 F (37.1 C) (Oral)   Resp 12   Ht 5' 2 (1.575 m)   Wt 202 lb 12.8 oz (92 kg)   SpO2 99%   BMI 37.09 kg/m    Physical Exam  General: No acute distress. Awake and conversant. +obese Eyes: Normal conjunctiva, anicteric. Round symmetric pupils.  Respiratory: CTAB. Respirations are non-labored. No wheezing.  Skin: Warm. No rashes or ulcers.  Psych: Alert and oriented. Cooperative, Appropriate mood and affect, Normal judgment.  CV: RRR. No murmur. No lower extremity edema.  MSK:No clubbing or cyanosis. Ambulates with cane Neuro:  CN II-XII grossly normal.   No results found for any visits on 02/11/24.      Assessment & Plan:   Problem List Items Addressed This Visit      Class 2 severe obesity with serious comorbidity and body mass index (BMI) of 37.0 to 37.9 in adult   DM type 2 with diabetic peripheral neuropathy (HCC) - Primary   Relevant Medications   DULoxetine  (CYMBALTA ) 20 MG capsule   Hyperlipidemia   Renal insufficiency    Meds ordered this encounter  Medications   DULoxetine  (CYMBALTA ) 20 MG capsule    Sig: Take 1 capsule (20 mg total) by mouth daily. Start Cymbalta  20 mg daily for 1 week, may increase to 20 mg twice daily (40 mg total per day) as tolerated.    Dispense:  60 capsule    Refill:  1    Supervising Provider:   DOMENICA BLACKBIRD A [[id number]]    Type 2 diabetes mellitus with diabetic neuropathy B/L LE neuropathy. Previous gabapentin  was too sedating.  - Start Cymbalta  20 mg daily for one week, may increase to 20 mg twice daily if no improvement. - Monitor blood sugar levels, reduce sugar alcohol intake. - Follow up in six weeks to assess Cymbalta  effectiveness.  Renal insufficiency - Monitor B12 intake, reduce multivitamin to every other day. - Continue nephrologist follow-up at [facility].  Hyperlipidemia and elevated triglycerides . Omega-3 beneficial for triglycerides. LDL under 70 mg/dL crucial for cardiovascular risk reduction in diabetics. - Continue atorvastatin  20 mg daily. - Continue omega-3 supplements. - Monitor cholesterol levels regularly.    Return in about 6 weeks (  around 03/24/2024).  Darol Cush L Krystopher Kuenzel, NP

## 2024-02-11 ENCOUNTER — Encounter: Payer: Self-pay | Admitting: Student

## 2024-02-11 ENCOUNTER — Ambulatory Visit (INDEPENDENT_AMBULATORY_CARE_PROVIDER_SITE_OTHER): Admitting: Student

## 2024-02-11 VITALS — BP 136/70 | HR 80 | Temp 98.7°F | Resp 12 | Ht 62.0 in | Wt 202.8 lb

## 2024-02-11 DIAGNOSIS — E785 Hyperlipidemia, unspecified: Secondary | ICD-10-CM | POA: Diagnosis not present

## 2024-02-11 DIAGNOSIS — N289 Disorder of kidney and ureter, unspecified: Secondary | ICD-10-CM

## 2024-02-11 DIAGNOSIS — E1142 Type 2 diabetes mellitus with diabetic polyneuropathy: Secondary | ICD-10-CM | POA: Diagnosis not present

## 2024-02-11 DIAGNOSIS — E66812 Obesity, class 2: Secondary | ICD-10-CM

## 2024-02-11 DIAGNOSIS — Z6837 Body mass index (BMI) 37.0-37.9, adult: Secondary | ICD-10-CM

## 2024-02-11 MED ORDER — DULOXETINE HCL 20 MG PO CPEP: 20.0000 mg | ORAL_CAPSULE | Freq: Every day | ORAL | 1 refills | Status: DC

## 2024-02-20 ENCOUNTER — Other Ambulatory Visit: Payer: Self-pay

## 2024-02-20 DIAGNOSIS — E1149 Type 2 diabetes mellitus with other diabetic neurological complication: Secondary | ICD-10-CM

## 2024-02-20 DIAGNOSIS — I10 Essential (primary) hypertension: Secondary | ICD-10-CM

## 2024-02-20 MED ORDER — AMLODIPINE BESYLATE 10 MG PO TABS: 10.0000 mg | ORAL_TABLET | Freq: Every day | ORAL | 1 refills | Status: AC

## 2024-02-20 MED ORDER — SITAGLIPTIN PHOSPHATE 50 MG PO TABS: 50.0000 mg | ORAL_TABLET | Freq: Every day | ORAL | 1 refills | Status: AC

## 2024-03-02 ENCOUNTER — Encounter: Payer: Self-pay | Admitting: Radiology

## 2024-03-13 ENCOUNTER — Ambulatory Visit: Payer: Self-pay

## 2024-03-13 NOTE — Telephone Encounter (Signed)
 FYI Only or Action Required?: FYI only for provider: appointment scheduled on 03/19/24.  Patient was last seen in primary care on 02/11/2024 by Mariah Harlene CROME, NP.  Called Nurse Triage reporting Otalgia.  Symptoms began several days ago.  Interventions attempted: OTC medications: Debrox ear drops.  Symptoms are: rapidly improving.  Triage Disposition: Home Care  Patient/caregiver understands and will follow disposition?: Yes  Copied from CRM #[id number]. Topic: Clinical - Red Word Triage >> Mar 13, 2024 12:24 PM Dedra B wrote: Red Word that prompted transfer to Nurse Triage: Pt having L ear pain. Warm transfer to NT. Reason for Disposition  Earache lasts < 60 minutes  Answer Assessment - Initial Assessment Questions Spoke with pts daughter Mariah Mason (on [place]) and pt. Reports mild left earache without fever drainage or other symptoms on Wednesday. Not taking abxs. Reports it completely subsided after starting debrox ear drops the same day. Reports hx of severe ear pain with build up of wax in the past. Had it cleaned out at that time and was told to use ear drops when she starts to feel was building up and onset of pain. Pt also asking to schedule 6 week f/u visit with PCP to assess effectiveness of cymbalta  for neuropathy discuss during her last visit on 11/14. Reports it has been effective so far. Scheduled 6 week f/u appt for 11/20 with PCP to assess Cymbalta  effectiveness and to have ears looked at. Pt advised home treatment at this time as ear drops have been effective. Advised UC or ED for worsening symptoms.   1. LOCATION: Which ear is involved?     Left  2. ONSET: When did the ear pain start?      Wednesday  3. SEVERITY: How bad is the pain?  (Scale 1-10; mild, moderate or severe)     Mild  4. URI SYMPTOMS: Do you have a runny nose or cough?     Denies  5. FEVER: Do you have a fever? If Yes, ask: What is your temperature, how was it measured, and when did it  start?     Denies  6. CAUSE: Have you been swimming recently?, How often do you use Q-TIPS?, Have you had any recent air travel or scuba diving?     Denies  7. OTHER SYMPTOMS: Do you have any other symptoms? (e.g., decreased hearing, dizziness, headache, stiff neck, vomiting)     Denies. Denies hx of ear infections, reports hx of ears getting clogged with ear wax. Current symptoms feels similar.  Protocols used: Rilla

## 2024-03-18 NOTE — Progress Notes (Deleted)
   Acute Office Visit  Subjective:     Patient ID: Mariah Mason, female    DOB: 1950/07/06, 73 y.o.   MRN: [id number]  No chief complaint on file.   HPI Patient is in today for follow-up.  History of Present Illness Mariah Mason is a 73 year old female with diabetes and neuropathy who presents with worsening neuropathy symptoms. She is accompanied by her daughter. Recently went to the emergency department with episode of dizziness.  Patient feels the increase in atorvastatin  contributed to her symptoms, she is not back to 20 mg atorvastatin . Dizziness has improved.  She experiences a pulling sensation in her legs and feet. Gabapentin  was previously used but discontinued due to sedation. She currently uses medicated compression stockings for symptom relief. Her blood sugar levels range from 120 to 130 in the mornings.   She has CKD and is under the care of a nephrologist at [facility].    Patient denies fever, chills, SOB, CP, palpitations, dyspnea, edema, HA, vision changes, N/V/D, abdominal pain, urinary symptoms, rash, weight changes.  ROS  See HPI    Objective:    There were no vitals taken for this visit.   Physical Exam  General: No acute distress. Awake and conversant. +obese Eyes: Normal conjunctiva, anicteric. Round symmetric pupils.  Respiratory: CTAB. Respirations are non-labored. No wheezing.  Skin: Warm. No rashes or ulcers.  Psych: Alert and oriented. Cooperative, Appropriate mood and affect, Normal judgment.  CV: RRR. No murmur. No lower extremity edema.  MSK:No clubbing or cyanosis. Ambulates with cane Neuro:  CN II-XII grossly normal.   No results found for any visits on 03/19/24.      Assessment & Plan:   Problem List Items Addressed This Visit     DM type 2 with diabetic peripheral neuropathy (HCC) - Primary    No orders of the defined types were placed in this encounter.   Type 2 diabetes mellitus with diabetic neuropathy B/L LE  neuropathy. Previous gabapentin  was too sedating.  - Start Cymbalta  20 mg daily for one week, may increase to 20 mg twice daily if no improvement. - Monitor blood sugar levels, reduce sugar alcohol intake. - Follow up in six weeks to assess Cymbalta  effectiveness.  Renal insufficiency - Monitor B12 intake, reduce multivitamin to every other day. - Continue nephrologist follow-up at [facility].  Hyperlipidemia and elevated triglycerides . Omega-3 beneficial for triglycerides. LDL under 70 mg/dL crucial for cardiovascular risk reduction in diabetics. - Continue atorvastatin  20 mg daily. - Continue omega-3 supplements. - Monitor cholesterol levels regularly.    No follow-ups on file.  Banner Huckaba L Jamarkis Branam, NP

## 2024-03-19 ENCOUNTER — Ambulatory Visit: Admitting: Student

## 2024-03-19 DIAGNOSIS — E1142 Type 2 diabetes mellitus with diabetic polyneuropathy: Secondary | ICD-10-CM

## 2024-03-20 ENCOUNTER — Ambulatory Visit (INDEPENDENT_AMBULATORY_CARE_PROVIDER_SITE_OTHER): Admitting: Family Medicine

## 2024-03-20 VITALS — BP 133/53 | HR 80 | Ht 62.0 in | Wt 197.0 lb

## 2024-03-20 DIAGNOSIS — Z7984 Long term (current) use of oral hypoglycemic drugs: Secondary | ICD-10-CM

## 2024-03-20 DIAGNOSIS — E1142 Type 2 diabetes mellitus with diabetic polyneuropathy: Secondary | ICD-10-CM

## 2024-03-20 NOTE — Progress Notes (Signed)
 Established Patient Office Visit  Subjective:  Patient ID: Mariah Mason, female    DOB: 1950-05-12  Age: 73 y.o. MRN: [id number]  CC:  Chief Complaint  Patient presents with   Medical Management of Chronic Issues   Ear Pain      HPI Mariah Mason is here for follow-up.    Discussed the use of AI scribe software for clinical note transcription with the patient, who gave verbal consent to proceed.  History of Present Illness Mariah Mason is a 73 year old female with diabetes who presents for follow-up on her nerve pain management.  She has been experiencing neuropathy and started taking Cymbalta , which she finds effective and notes a positive difference. She takes it once daily without side effects. Previously, she was on gabapentin , which caused excessive sedation.  She has arthritis in her knee, causing discomfort. She uses a knee brace and previously participated in physical therapy but has not returned recently. She exercises at home with equipment provided by her daughter.  She recently experienced a clogged and painful right ear. Ear drops alleviated the symptoms, and she no longer has discomfort. No fever or other systemic symptoms are present.  Her blood sugar levels are stable. She is under nephrology care for kidney function monitoring. Her last A1c was stable.  No chest pain, shortness of breath, dizziness, or fever.   Lab Results  Component Value Date   HGBA1C 7.5 (H) 01/28/2024        Past Medical History:  Diagnosis Date   Acid reflux    Diabetes mellitus without complication (HCC)    Gout    Hyperlipemia    Hypertension    Neuropathy     Past Surgical History:  Procedure Laterality Date   CYST EXCISION N/A 08/30/2020   Procedure: EXCISION 1X2 cm LEFT NECK SEBACEOUS CYST EXCISION 2X2 CM RIGHT BACK SEBACEOUS CYST;  Surgeon: Ebbie Cough, MD;  Location:  SURGERY CENTER;  Service: General;  Laterality: N/A;   THYROID SURGERY       Family History  Problem Relation Age of Onset   Heart attack Mother    Kidney failure Father     Social History   Socioeconomic History   Marital status: Single    Spouse name: Not on file   Number of children: 6   Years of education: 9th grade   Highest education level: Not on file  Occupational History   Occupation: Retired  Tobacco Use   Smoking status: Never   Smokeless tobacco: Never  Vaping Use   Vaping status: Never Used  Substance and Sexual Activity   Alcohol use: Never   Drug use: Never   Sexual activity: Not on file  Other Topics Concern   Not on file  Social History Narrative   Lives with family.   Right-handed.   No daily use of caffeine.   Social Drivers of Corporate Investment Banker Strain: Low Risk  (11/27/2023)   Overall Financial Resource Strain (CARDIA)    Difficulty of Paying Living Expenses: Not hard at all  Food Insecurity: No Food Insecurity (11/27/2023)   Hunger Vital Sign    Worried About Running Out of Food in the Last Year: Never true    Ran Out of Food in the Last Year: Never true  Transportation Needs: No Transportation Needs (11/27/2023)   PRAPARE - Administrator, Civil Service (Medical): No    Lack of Transportation (Non-Medical): No  Physical Activity: Sufficiently Active (11/27/2023)  Exercise Vital Sign    Days of Exercise per Week: 6 days    Minutes of Exercise per Session: 30 min  Stress: No Stress Concern Present (11/27/2023)   [employer] of Occupational Health - Occupational Stress Questionnaire    Feeling of Stress: Not at all  Social Connections: Moderately Integrated (11/27/2023)   Social Connection and Isolation Panel    Frequency of Communication with Friends and Family: More than three times a week    Frequency of Social Gatherings with Friends and Family: More than three times a week    Attends Religious Services: More than 4 times per year    Active Member of [employer] or Organizations: Yes     Attends Engineer, Structural: More than 4 times per year    Marital Status: Never married  Intimate Partner Violence: Not At Risk (11/27/2023)   Humiliation, Afraid, Rape, and Kick questionnaire    Fear of Current or Ex-Partner: No    Emotionally Abused: No    Physically Abused: No    Sexually Abused: No    ROS All ROS negative except what is listed in the HPI.   Objective:   Today's Vitals: BP (!) 133/53   Pulse 80   Ht 5' 2 (1.575 m)   Wt 197 lb (89.4 kg)   SpO2 100%   BMI 36.03 kg/m   Physical Exam Vitals reviewed.  Constitutional:      Appearance: Normal appearance.  HENT:     Right Ear: There is no impacted cerumen.     Left Ear: There is no impacted cerumen.  Cardiovascular:     Rate and Rhythm: Normal rate and regular rhythm.  Pulmonary:     Effort: Pulmonary effort is normal.     Breath sounds: Normal breath sounds.  Skin:    General: Skin is warm and dry.  Neurological:     Mental Status: She is alert and oriented to person, place, and time.  Psychiatric:        Mood and Affect: Mood normal.        Behavior: Behavior normal.        Thought Content: Thought content normal.        Judgment: Judgment normal.          Assessment & Plan:   Problem List Items Addressed This Visit       Active Problems   DM type 2 with diabetic peripheral neuropathy (HCC) - Primary   Managed with Cymbalta , effective and well-tolerated. Previous gabapentin  caused sedation. - Continue Cymbalta  - Follow up in January for A1c and kidney function recheck.      Relevant Medications   FARXIGA  10 MG TABS tablet       Follow-up: Return if symptoms worsen or fail to improve, for / keep scheduled appointments.   Mariah FURY Almarie, DNP, FNP-C  I,Mariah Mason,acting as a neurosurgeon for Mariah KATHEE Almarie, NP.,have documented all relevant documentation on the behalf of Mariah KATHEE Almarie, NP.  I, Mariah KATHEE Almarie, NP, have reviewed all documentation for this visit. The  documentation on 03/20/2024 for the exam, diagnosis, procedures, and orders are all accurate and complete.

## 2024-03-22 ENCOUNTER — Encounter: Payer: Self-pay | Admitting: Family Medicine

## 2024-03-22 NOTE — Assessment & Plan Note (Signed)
 Managed with Cymbalta , effective and well-tolerated. Previous gabapentin  caused sedation. - Continue Cymbalta  - Follow up in January for A1c and kidney function recheck.

## 2024-03-24 ENCOUNTER — Other Ambulatory Visit: Payer: Self-pay

## 2024-03-24 ENCOUNTER — Encounter (HOSPITAL_BASED_OUTPATIENT_CLINIC_OR_DEPARTMENT_OTHER): Payer: Self-pay

## 2024-03-24 ENCOUNTER — Emergency Department (HOSPITAL_BASED_OUTPATIENT_CLINIC_OR_DEPARTMENT_OTHER)
Admission: EM | Admit: 2024-03-24 | Discharge: 2024-03-24 | Disposition: A | Discharge status: Home / Self Care | Attending: Emergency Medicine | Admitting: Emergency Medicine

## 2024-03-24 DIAGNOSIS — N184 Chronic kidney disease, stage 4 (severe): Secondary | ICD-10-CM

## 2024-03-24 DIAGNOSIS — R030 Elevated blood-pressure reading, without diagnosis of hypertension: Secondary | ICD-10-CM

## 2024-03-24 DIAGNOSIS — I10 Essential (primary) hypertension: Secondary | ICD-10-CM | POA: Insufficient documentation

## 2024-03-24 LAB — CBG MONITORING, ED: Glucose-Capillary: 158 mg/dL — ABNORMAL HIGH (ref 70–99)

## 2024-03-24 MED ORDER — CARVEDILOL 12.5 MG PO TABS
25.0000 mg | ORAL_TABLET | Freq: Once | ORAL | Status: AC
  Administered 2024-03-24: 25 mg via ORAL
  Filled 2024-03-24: qty 2

## 2024-03-24 MED ORDER — AMLODIPINE BESYLATE 5 MG PO TABS
10.0000 mg | ORAL_TABLET | Freq: Once | ORAL | Status: AC
  Administered 2024-03-24: 10 mg via ORAL
  Filled 2024-03-24: qty 2

## 2024-03-24 NOTE — ED Triage Notes (Addendum)
 Pt concerned due to BP being higher than normal for the 3 days. Denies CP, SOB, Blurred vision or dizziness. Taking meds as prescribed. Pt has not taken BP meds this morning waits 30 mins after taking farxiga 

## 2024-03-24 NOTE — ED Provider Notes (Signed)
 [place] [facility] Provider Note   CSN: [id number] Arrival date & time: 03/24/24  9180     Patient presents with: Hypertension   Mariah Mason is a 73 y.o. female.   Pt indicates history of hypertension and is concerned b/c blood pressure reading was high - indicates last night was 170/80 range.  Indicates has not yet taken her blood pressure meds today - indicates her routine is to check her blood sugar, and then her AM meds, but had not yet done that yet today. Is requesting glucose be checked now. Indicates normally takes one of her carvedilol  doses in the AM, and her amlodipine  in AM, and then takes her olmesartan around noon. Denies recent change in meds or doses. States otherwise does not feel sick or ill. No new symptoms. No headache. No change in speech or vision, no numbness/weakness. No chest pain or sob. No swelling.   The history is provided by the patient, medical records and a relative.  Hypertension Pertinent negatives include no chest pain, no abdominal pain, no headaches and no shortness of breath.       Prior to Admission medications   Medication Sig Start Date End Date Taking? Authorizing Provider  allopurinol  (ZYLOPRIM ) 100 MG tablet Take 1 tablet (100 mg total) by mouth daily. 11/25/23  Yes Jason Leita Repine, FNP  amLODipine  (NORVASC ) 10 MG tablet Take 1 tablet (10 mg total) by mouth daily. 02/20/24  Yes Wheeler Harlene CROME, NP  atorvastatin  (LIPITOR) 40 MG tablet Take 1 tablet (40 mg total) by mouth daily. Patient taking differently: Take 20 mg by mouth daily. 01/30/24  Yes Wheeler Harlene CROME, NP  carvedilol  (COREG ) 25 MG tablet TAKE 1 TABLET(25 MG) BY MOUTH TWICE DAILY WITH A MEAL 05/30/23  Yes Jason Leita Repine, FNP  cloNIDine  (CATAPRES ) 0.1 MG tablet TAKE 1 TABLET(0.1 MG) BY MOUTH EVERY EVENING 12/29/21  Yes Jason Leita Repine, FNP  colchicine  0.6 MG tablet Take 2 tablets (1.2mg ) by mouth at onset for gout, may  repeat 1 tablet 0.6mg  in 2 hours is symptoms persist 09/26/21  Yes Jason Leita Repine, FNP  FARXIGA  10 MG TABS tablet Take 10 mg by mouth daily. 02/28/24  Yes [provider]  ACCU-CHEK GUIDE TEST test strip CHECK BLOOD SUGAR ONCE DAILY 05/06/23   Jason Leita Repine, FNP  Accu-Chek Softclix Lancets lancets 4 (four) times daily. 04/27/21   [provider]  ammonium lactate  (LAC-HYDRIN ) 12 % lotion APPLY TOPICALLY TO THE SKIN DAILY AS NEEDED FOR DRY SKIN 06/03/23   Gaynel Delon CROME, DPM  blood glucose meter kit and supplies KIT Dispense based on patient and insurance preference. Use up to four times daily as directed. (FOR ICD-9 250.00, 250.01). 03/28/21   Jason Leita Repine, FNP  cholecalciferol (VITAMIN D3) 25 MCG (1000 UT) tablet Take 1,000 Units by mouth daily.    [provider]  cycloSPORINE  (RESTASIS ) 0.05 % ophthalmic emulsion Place 1 drop into both eyes 2 (two) times daily.  12/05/18   [provider]  DULoxetine  (CYMBALTA ) 20 MG capsule Take 1 capsule (20 mg total) by mouth daily. Start Cymbalta  20 mg daily for 1 week, may increase to 20 mg twice daily (40 mg total per day) as tolerated. 02/11/24   Wheeler Harlene CROME, NP  Elastic Bandages & Supports (MEDICAL COMPRESSION SOCKS) MISC Compression socks for DM peripheral neuropathy and bilateral leg swelling 08/02/20   Onita Duos, MD  ferrous sulfate 325 (65 FE) MG tablet Take 325  mg by mouth daily with breakfast.    [provider]  Misc. Devices MISC Shower chair. Dx- Diabetes mellitus 08/12/19   Delbert Clam, MD  Multiple Vitamins-Minerals (CENTRUM SILVER ADULT 50+ PO) Take 1 tablet by mouth daily.    [provider]  NONFORMULARY OR COMPOUNDED ITEM Peripheral Neuropathy Cream: Bupivacaine  1%, Doxepin 3%, Gabapentin  6%, Pentoxifylline 3%, Topiramate 1% Order faxed to [facility] 10/05/19   [place], Delon CROME, DPM  sitaGLIPtin  (JANUVIA ) 50 MG tablet Take 1 tablet (50 mg  total) by mouth daily. 02/20/24   Wheeler Harlene CROME, NP  sodium bicarbonate  650 MG tablet Take 650 mg by mouth 2 (two) times daily.    [provider]  valsartan  (DIOVAN ) 80 MG tablet Take 80 mg by mouth daily.    [provider]    Allergies: Hydrochlorothiazide, Erythromycin, and Hydralazine     Review of Systems  Constitutional:  Negative for fever.  Eyes:  Negative for visual disturbance.  Respiratory:  Negative for shortness of breath.   Cardiovascular:  Negative for chest pain.  Gastrointestinal:  Negative for abdominal pain and vomiting.  Neurological:  Negative for speech difficulty, weakness, numbness and headaches.    Updated Vital Signs BP (!) 144/72   Pulse 83   Temp 98 F (36.7 C)   Resp 18   Wt 89.8 kg   SpO2 99%   BMI 36.21 kg/m   Physical Exam Vitals and nursing note reviewed.  Constitutional:      Appearance: Normal appearance. She is well-developed.  HENT:     Head: Atraumatic.     Nose: Nose normal.     Mouth/Throat:     Mouth: Mucous membranes are moist.  Eyes:     General: No scleral icterus.    Conjunctiva/sclera: Conjunctivae normal.     Pupils: Pupils are equal, round, and reactive to light.  Neck:     Trachea: No tracheal deviation.  Cardiovascular:     Rate and Rhythm: Normal rate and regular rhythm.     Pulses: Normal pulses.     Heart sounds: Normal heart sounds. No murmur heard.    No friction rub. No gallop.  Pulmonary:     Effort: Pulmonary effort is normal. No respiratory distress.     Breath sounds: Normal breath sounds.  Abdominal:     General: There is no distension.     Palpations: Abdomen is soft.     Tenderness: There is no abdominal tenderness.  Musculoskeletal:        General: No swelling.     Cervical back: Normal range of motion and neck supple. No rigidity. No muscular tenderness.     Right lower leg: No edema.     Left lower leg: No edema.  Skin:    General: Skin is warm and dry.     Findings:  No rash.  Neurological:     Mental Status: She is alert.     Comments: Alert, speech normal. Motor/sens grossly intact bil. Steady gait.   Psychiatric:        Mood and Affect: Mood normal.     (all labs ordered are listed, but only abnormal results are displayed) Results for orders placed or performed during the hospital encounter of 03/24/24  POC CBG, ED   Collection Time: 03/24/24  8:53 AM  Result Value Ref Range   Glucose-Capillary 158 (H) 70 - 99 mg/dL     EKG: None  Radiology: No results found.   Procedures   Medications  Ordered in the ED  amLODipine  (NORVASC ) tablet 10 mg (10 mg Oral Given 03/24/24 0851)  carvedilol  (COREG ) tablet 25 mg (25 mg Oral Given 03/24/24 0851)                                    Medical Decision Making Problems Addressed: Elevated blood pressure reading: acute illness or injury Essential hypertension: chronic illness or injury with exacerbation, progression, or side effects of treatment that poses a threat to life or bodily functions Stage 4 chronic kidney disease (HCC): chronic illness or injury  Amount and/or Complexity of Data Reviewed Independent Historian:     Details: Family, hx External Data Reviewed: notes. Labs: ordered. Decision-making details documented in ED Course.  Risk Prescription drug management. Decision regarding hospitalization.   Reviewed nursing notes and prior charts for additional history.   Pt requests blood sugar check - cbg ordered.   Pt confirms has not yet had any of her bp meds today - a dose of her carvedilol  and amlodipine  is ordered.   Labs reviewed/interpreted by me - glucose 158. Ok   Blood pressure reading is high, but other than taking her blood pressure meds, no indication of need for further acute lowering of blood pressure now - discussed w pt, and rec pcp f/u.  Return precautions provided.        Final diagnoses:  None    ED Discharge Orders     None           Bernard Drivers, MD 03/24/24 [phone]

## 2024-03-24 NOTE — Discharge Instructions (Addendum)
 It was our pleasure to provide your ER care today - we hope that you feel better.  Your blood sugar is 158.   For your high blood pressure - follow heart health eating plan, continue your meds, and follow up with your primary care doctor in 1-2 weeks.   Return to ER if worse, new symptoms, chest pain, trouble breathing, severe headache, numbness/weakness, change in speech or vision, or other concern.

## 2024-03-24 NOTE — ED Notes (Signed)
 Pt is asymptomatic and not having any pain. Just here for hypertension.

## 2024-03-31 ENCOUNTER — Other Ambulatory Visit: Payer: Self-pay | Admitting: *Deleted

## 2024-03-31 ENCOUNTER — Ambulatory Visit: Payer: Self-pay

## 2024-03-31 MED ORDER — BLOOD GLUCOSE MONITOR KIT: PACK | 0 refills | Status: AC

## 2024-03-31 NOTE — Telephone Encounter (Signed)
 FYI Only or Action Required?: FYI only for provider: appointment scheduled on 04/08/24; soonest available.  Patient was last seen in primary care on 03/20/2024 by Almarie Waddell NOVAK, NP.  Called Nurse Triage reporting Hypertension.  Symptoms began today.  Interventions attempted: Prescription medications: norvasc .  Symptoms are: gradually worsening.  Triage Disposition: See PCP When Office is Open (Within 3 Days)  Patient/caregiver understands and will follow disposition?: Yes   Copied from CRM #[id number]. Topic: Clinical - Red Word Triage >> Mar 31, 2024 12:05 PM Eva FALCON wrote: Red Word that prompted transfer to Nurse Triage: Daughter [name] is calling in and states mom went to ER on 11/25 for her BP it was spiking up and down, also knee gave out today and cause BP to spike.  Reason for Disposition  Systolic BP >= 160 OR Diastolic >= 100  Answer Assessment - Initial Assessment Questions Pt called in with her daughter and niece on speaker phone as she is having elevated BP readings. She stated she went to the ED 11/25 d/t elevated BP and staff were able to bring it down before discharge. She stated she was walking in her living room this morning and her knee gave out. She was close to her chair so she was able to sit down and not fall. Her niece has been taking manual blood pressures to monitor. Pt is asymptomatic; denies chest pain, SOB or weakness. Appointment scheduled for evaluation 12/10 as soonest available. Patient agrees with plan of care, and will call back if anything changes, or if symptoms worsen.   1. BLOOD PRESSURE: What is your blood pressure? Did you take at least two measurements 5 minutes apart?     Yes; manually via niece 135/70 this morning; after knee 165/80; 172/85 at time of call   2. ONSET: When did you take your blood pressure?     This morning, 8am   3. HOW: How did you take your blood pressure? (e.g., automatic home BP monitor, visiting nurse)      Manual via niece who is a CMA   4. HISTORY: Do you have a history of high blood pressure?     Yes   5. MEDICINES: Are you taking any medicines for blood pressure? Have you missed any doses recently?     Yes; no missed dosages   6. OTHER SYMPTOMS: Do you have any symptoms? (e.g., blurred vision, chest pain, difficulty breathing, headache, weakness)     None; pt reports her knee gave out this morning but she did not fall and was not feeling weak  Protocols used: Blood Pressure - High-A-AH

## 2024-04-06 DIAGNOSIS — D631 Anemia in chronic kidney disease: Secondary | ICD-10-CM | POA: Insufficient documentation

## 2024-04-06 NOTE — Assessment & Plan Note (Addendum)
 Stable, asymptomatic.  Repeat CBC and Iron panel in 4 weeks

## 2024-04-06 NOTE — Assessment & Plan Note (Signed)
 Managed with Cymbalta , effective and well-tolerated. Previous gabapentin  caused sedation. - Continue Cymbalta 

## 2024-04-06 NOTE — Progress Notes (Unsigned)
   Acute Office Visit  Subjective:     Patient ID: Mariah Mason, female    DOB: 05-May-1950, 73 y.o.   MRN: [id number]  No chief complaint on file.   HPI Patient is in today for ER follow up for HTN  HTN- Amlodipine  10 mg, Valsartan  80 mg    ROS      Objective:    There were no vitals taken for this visit. {Vitals History (Optional):23777}  Physical Exam  No results found for any visits on 04/08/24.      Assessment & Plan:   Problem List Items Addressed This Visit     Hypertension - Primary    No orders of the defined types were placed in this encounter.   No follow-ups on file.  Tineshia Becraft L Tashunda Vandezande, NP

## 2024-04-06 NOTE — Assessment & Plan Note (Signed)
 Tolerating Statin. Encourage heart healthy diet such as MIND or DASH diet, increase exercise, avoid trans fats, simple carbohydrates and processed foods, consider a krill or fish or flaxseed oil cap daily.

## 2024-04-06 NOTE — Assessment & Plan Note (Signed)
 Well controlled, no changes to meds. Encouraged heart healthy diet such as the DASH diet and exercise as tolerated.

## 2024-04-06 NOTE — Assessment & Plan Note (Signed)
 On allopurinol . Denies recent flares. Check uric acid in 4 weeks

## 2024-04-08 ENCOUNTER — Ambulatory Visit: Admitting: Student

## 2024-04-08 ENCOUNTER — Encounter: Payer: Self-pay | Admitting: Student

## 2024-04-08 VITALS — BP 165/71 | HR 73 | Ht 62.0 in | Wt 193.4 lb

## 2024-04-08 DIAGNOSIS — E785 Hyperlipidemia, unspecified: Secondary | ICD-10-CM

## 2024-04-08 DIAGNOSIS — I10 Essential (primary) hypertension: Secondary | ICD-10-CM

## 2024-04-08 DIAGNOSIS — D631 Anemia in chronic kidney disease: Secondary | ICD-10-CM

## 2024-04-08 DIAGNOSIS — Z8739 Personal history of other diseases of the musculoskeletal system and connective tissue: Secondary | ICD-10-CM | POA: Diagnosis not present

## 2024-04-08 DIAGNOSIS — R7989 Other specified abnormal findings of blood chemistry: Secondary | ICD-10-CM

## 2024-04-08 DIAGNOSIS — E1142 Type 2 diabetes mellitus with diabetic polyneuropathy: Secondary | ICD-10-CM

## 2024-04-08 DIAGNOSIS — G8929 Other chronic pain: Secondary | ICD-10-CM | POA: Insufficient documentation

## 2024-04-08 DIAGNOSIS — R252 Cramp and spasm: Secondary | ICD-10-CM

## 2024-04-08 MED ORDER — VALSARTAN 160 MG PO TABS: 160.0000 mg | ORAL_TABLET | Freq: Every day | ORAL | 1 refills | Status: AC

## 2024-04-08 NOTE — Assessment & Plan Note (Signed)
 Follows with Ortho, recieiving knee injections.

## 2024-04-09 ENCOUNTER — Ambulatory Visit: Payer: Self-pay | Admitting: Student

## 2024-04-09 LAB — CBC
HCT: 34.6 % — ABNORMAL LOW (ref 36.0–46.0)
Hemoglobin: 11.5 g/dL — ABNORMAL LOW (ref 12.0–15.0)
MCHC: 33.3 g/dL (ref 30.0–36.0)
MCV: 82.6 fl (ref 78.0–100.0)
Platelets: 244 K/uL (ref 150.0–400.0)
RBC: 4.18 Mil/uL (ref 3.87–5.11)
RDW: 13.6 % (ref 11.5–15.5)
WBC: 6.2 K/uL (ref 4.0–10.5)

## 2024-04-09 LAB — COMPREHENSIVE METABOLIC PANEL WITH GFR
ALT: 10 U/L (ref 0–35)
AST: 12 U/L (ref 0–37)
Albumin: 4.5 g/dL (ref 3.5–5.2)
Alkaline Phosphatase: 75 U/L (ref 39–117)
BUN: 32 mg/dL — ABNORMAL HIGH (ref 6–23)
CO2: 27 meq/L (ref 19–32)
Calcium: 9.6 mg/dL (ref 8.4–10.5)
Chloride: 103 meq/L (ref 96–112)
Creatinine, Ser: 1.67 mg/dL — ABNORMAL HIGH (ref 0.40–1.20)
GFR: 30.07 mL/min — ABNORMAL LOW (ref >60.00)
Glucose, Bld: 110 mg/dL — ABNORMAL HIGH (ref 70–99)
Potassium: 4.6 meq/L (ref 3.5–5.1)
Sodium: 139 meq/L (ref 135–145)
Total Bilirubin: 0.5 mg/dL (ref 0.2–1.2)
Total Protein: 7.4 g/dL (ref 6.0–8.3)

## 2024-04-09 LAB — IRON,TIBC AND FERRITIN PANEL
%SAT: 7 % — ABNORMAL LOW (ref 16–45)
Ferritin: 120 ng/mL (ref 16–288)
Iron: 17 ug/dL — ABNORMAL LOW (ref 45–160)
TIBC: 244 ug/dL — ABNORMAL LOW (ref 250–450)

## 2024-04-09 LAB — LIPID PANEL
Cholesterol: 96 mg/dL (ref 0–200)
HDL: 29.8 mg/dL — ABNORMAL LOW (ref >39.00)
LDL Cholesterol: 43 mg/dL (ref 0–99)
NonHDL: 66.28
Total CHOL/HDL Ratio: 3
Triglycerides: 114 mg/dL (ref 0.0–149.0)
VLDL: 22.8 mg/dL (ref 0.0–40.0)

## 2024-04-09 LAB — HEMOGLOBIN A1C: Hgb A1c MFr Bld: 6.6 % — ABNORMAL HIGH (ref 4.6–6.5)

## 2024-04-09 LAB — MAGNESIUM: Magnesium: 2.5 mg/dL (ref 1.5–2.5)

## 2024-04-09 LAB — URIC ACID: Uric Acid, Serum: 5.6 mg/dL (ref 2.4–7.0)

## 2024-04-09 LAB — VITAMIN B12: Vitamin B-12: 975 pg/mL — ABNORMAL HIGH (ref 211–911)

## 2024-04-14 ENCOUNTER — Ambulatory Visit: Admitting: Podiatry

## 2024-04-14 ENCOUNTER — Other Ambulatory Visit: Payer: Self-pay

## 2024-04-14 DIAGNOSIS — E1142 Type 2 diabetes mellitus with diabetic polyneuropathy: Secondary | ICD-10-CM

## 2024-04-14 MED ORDER — DULOXETINE HCL 20 MG PO CPEP: 20.0000 mg | ORAL_CAPSULE | Freq: Two times a day (BID) | ORAL | 0 refills | Status: AC

## 2024-04-20 ENCOUNTER — Telehealth: Payer: Self-pay | Admitting: Physician Assistant

## 2024-04-20 NOTE — Telephone Encounter (Signed)
 Pt's daughter [name] calling about gel injection approval. Pt is ready to schedule her appointment

## 2024-04-21 NOTE — Telephone Encounter (Signed)
Talked with patient's daughter and appt.has been scheduled for gel injection.

## 2024-04-22 ENCOUNTER — Other Ambulatory Visit (INDEPENDENT_AMBULATORY_CARE_PROVIDER_SITE_OTHER)

## 2024-04-22 ENCOUNTER — Ambulatory Visit: Admitting: *Deleted

## 2024-04-22 VITALS — BP 136/66 | HR 80

## 2024-04-22 DIAGNOSIS — I10 Essential (primary) hypertension: Secondary | ICD-10-CM | POA: Diagnosis not present

## 2024-04-22 NOTE — Progress Notes (Signed)
 Pt in for BP check per pcp.  Pt currently taking Amlodipine  10mg , Valsartan  160mg .  Today's bp is 136/66 pulse 80  Per DOD, stay on same dose and keep f/u with pcp.

## 2024-04-22 NOTE — Addendum Note (Signed)
 Addended by: TRUDY CURVIN RAMAN on: 04/22/2024 10:34 AM   Modules accepted: Orders

## 2024-04-22 NOTE — Addendum Note (Signed)
 Addended by: TRUDY CURVIN RAMAN on: 04/22/2024 10:59 AM   Modules accepted: Orders

## 2024-04-23 LAB — BASIC METABOLIC PANEL WITH GFR
BUN/Creatinine Ratio: 20 (calc) (ref 6–22)
BUN: 34 mg/dL — ABNORMAL HIGH (ref 7–25)
CO2: 27 mmol/L (ref 20–32)
Calcium: 9.5 mg/dL (ref 8.6–10.4)
Chloride: 105 mmol/L (ref 98–110)
Creat: 1.72 mg/dL — ABNORMAL HIGH (ref 0.60–1.00)
Glucose, Bld: 180 mg/dL — ABNORMAL HIGH (ref 65–99)
Potassium: 4.7 mmol/L (ref 3.5–5.3)
Sodium: 138 mmol/L (ref 135–146)
eGFR: 31 mL/min/1.73m2 — ABNORMAL LOW

## 2024-04-27 ENCOUNTER — Other Ambulatory Visit: Payer: Self-pay

## 2024-04-27 MED ORDER — ACCU-CHEK SOFTCLIX LANCETS MISC: 12 refills | Status: AC

## 2024-04-28 ENCOUNTER — Other Ambulatory Visit: Payer: Self-pay | Admitting: Student

## 2024-04-28 ENCOUNTER — Ambulatory Visit: Admitting: Physician Assistant

## 2024-04-28 DIAGNOSIS — E1122 Type 2 diabetes mellitus with diabetic chronic kidney disease: Secondary | ICD-10-CM

## 2024-04-28 MED ORDER — ACCU-CHEK GUIDE TEST VI STRP: ORAL_STRIP | 12 refills | Status: AC

## 2024-04-28 NOTE — Telephone Encounter (Signed)
 Copied from CRM #[id number]. Topic: Clinical - Medication Refill >> Apr 28, 2024  4:28 PM Lauren C wrote: Medication: ACCU-CHEK GUIDE TEST test strip  Has the patient contacted their pharmacy? Yes Pharmacy is the one reaching out.  This is the patient's preferred pharmacy:  [id number] - [place], [place] - [address] [place] [address] Phone: [phone] Fax: [phone]  Is this the correct pharmacy for this prescription? Yes If no, delete pharmacy and type the correct one.   Has the prescription been filled recently? Yes  Is the patient out of the medication? Unknown  Has the patient been seen for an appointment in the last year OR does the patient have an upcoming appointment? Yes  Can we respond through MyChart? Yes  Agent: Please be advised that Rx refills may take up to 3 business days. We ask that you follow-up with your pharmacy.

## 2024-05-05 ENCOUNTER — Ambulatory Visit (INDEPENDENT_AMBULATORY_CARE_PROVIDER_SITE_OTHER): Admitting: Physician Assistant

## 2024-05-05 DIAGNOSIS — M1711 Unilateral primary osteoarthritis, right knee: Secondary | ICD-10-CM | POA: Diagnosis not present

## 2024-05-05 DIAGNOSIS — M170 Bilateral primary osteoarthritis of knee: Secondary | ICD-10-CM

## 2024-05-05 MED ORDER — HYALURONAN 88 MG/4ML IX SOSY
88.0000 mg | PREFILLED_SYRINGE | INTRA_ARTICULAR | Status: AC | PRN
  Administered 2024-05-05: 88 mg via INTRA_ARTICULAR

## 2024-05-05 NOTE — Progress Notes (Signed)
 "  Office Visit Note   Patient: Mariah Mason           Date of Birth: 06/27/1950           MRN: [id number] Visit Date: 05/05/2024              Requested by: Wheeler Harlene CROME, NP [address], [address] [place],  [place] [address] PCP: Wheeler Harlene CROME, NP      HPI: Patient is a 74 year old woman who comes in today for a Monovisc injection into her right knee  Assessment & Plan: Visit Diagnoses:  1. Primary osteoarthritis of both knees     Plan: I discussed with her and her daughter possible side effects and when they may expect to see some improvement.  If they have any questions they should contact us   Follow-Up Instructions: Return if symptoms worsen or fail to improve.   Ortho Exam  Patient is alert, oriented, no adenopathy, well-dressed, normal affect, normal respiratory effort. Right knee no effusion no erythema compartments are soft and compressible she is neurovascular intact    Imaging: No results found. No images are attached to the encounter.  Labs: Lab Results  Component Value Date   HGBA1C 6.6 (H) 04/08/2024   HGBA1C 7.5 (H) 01/28/2024   HGBA1C 7.2 (H) 07/19/2023   ESRSEDRATE 51 (H) 08/02/2020   CRP 2 08/02/2020   LABURIC 5.6 04/08/2024     Lab Results  Component Value Date   ALBUMIN 4.5 04/08/2024   ALBUMIN 4.5 02/05/2024   ALBUMIN 4.5 01/28/2024    Lab Results  Component Value Date   MG 2.5 04/08/2024   MG 2.3 04/07/2022   Lab Results  Component Value Date   VD25OH 53.39 01/28/2024    No results found for: PREALBUMIN    Latest Ref Rng & Units 04/08/2024    2:47 PM 02/05/2024   10:04 AM 01/28/2024    3:10 PM  CBC EXTENDED  WBC 4.0 - 10.5 K/uL 6.2  4.8  5.5   RBC 3.87 - 5.11 Mil/uL 4.18  4.35  4.31   Hemoglobin 12.0 - 15.0 g/dL 88.4  88.1  88.1   HCT 36.0 - 46.0 % 34.6  36.2  36.3   Platelets 150.0 - 400.0 K/uL 244.0  239  237.0   NEUT# 1.7 - 7.7 K/uL  3.2  3.5   Lymph# 0.7 - 4.0 K/uL  1.3  1.5      There is no  height or weight on file to calculate BMI.  Orders:  No orders of the defined types were placed in this encounter.  No orders of the defined types were placed in this encounter.    Procedures: Large Joint Inj: R knee on 05/05/2024 2:47 PM Indications: pain and diagnostic evaluation Details: 22 G 1.5 in needle, anteromedial approach  Arthrogram: No  Medications: 88 mg Hyaluronan 88 MG/4ML Outcome: tolerated well, no immediate complications Procedure, treatment alternatives, risks and benefits explained, specific risks discussed. Consent was given by the patient.     Clinical Data: No additional findings.  ROS:  All other systems negative, except as noted in the HPI. Review of Systems  Objective: Vital Signs: There were no vitals taken for this visit.  Specialty Comments:  No specialty comments available.  PMFS History: Patient Active Problem List   Diagnosis Date Noted   Chronic pain of both knees 04/08/2024   Anemia due to stage 4 chronic kidney disease (HCC) 04/06/2024   Class 2 severe  obesity with serious comorbidity and body mass index (BMI) of 37.0 to 37.9 in adult 02/11/2024   Renal insufficiency 01/28/2024   Hyperlipidemia 01/28/2024   Neuropathy 01/28/2024   Vitamin D  deficiency, unspecified 01/27/2024   Hx of gout 01/27/2024   Osteoarthritis of knees, bilateral 12/04/2022   Bilateral lumbar radiculopathy 08/22/2020   DM type 2 with diabetic peripheral neuropathy (HCC) 08/02/2020   Gait abnormality 08/02/2020   Gout    Hypertension    Type 2 diabetes mellitus (HCC) 02/18/2019   AKI (acute kidney injury) 02/18/2019   Hyperkalemia 02/18/2019   Metabolic acidosis 02/18/2019   Hypoglycemia 02/17/2019   Long term current use of oral hypoglycemic drug 05/23/2017   Presbyopia of both eyes 05/23/2017   Vitreous floaters of both eyes 05/23/2017   Keratoconjunctivitis sicca of both eyes not specified as Sjogren's 10/16/2016   Nuclear  sclerotic cataract of both eyes 10/16/2016   Past Medical History:  Diagnosis Date   Acid reflux    Diabetes mellitus without complication (HCC)    Gout    Hyperlipemia    Hypertension    Neuropathy     Family History  Problem Relation Age of Onset   Heart attack Mother    Kidney failure Father     Past Surgical History:  Procedure Laterality Date   CYST EXCISION N/A 08/30/2020   Procedure: EXCISION 1X2 cm LEFT NECK SEBACEOUS CYST EXCISION 2X2 CM RIGHT BACK SEBACEOUS CYST;  Surgeon: Ebbie Cough, MD;  Location: [facility];  Service: General;  Laterality: N/A;   THYROID SURGERY     Social History   Occupational History   Occupation: Retired  Tobacco Use   Smoking status: Never   Smokeless tobacco: Never  Vaping Use   Vaping status: Never Used  Substance and Sexual Activity   Alcohol use: Never   Drug use: Never   Sexual activity: Not on file       "

## 2024-05-12 ENCOUNTER — Telehealth: Payer: Self-pay

## 2024-05-12 NOTE — Telephone Encounter (Signed)
 Called patients daughter to set up a 3 month follow up. There was no answer, left a voicemail to call the office back to make an appointment.

## 2024-05-12 NOTE — Telephone Encounter (Signed)
 Copied from CRM #[id number]. Topic: Clinical - Lab/Test Results >> May 11, 2024  4:52 PM Hadassah PARAS wrote: Reason for CRM: Pt's daughter called in stating she was advised that if test results came back normal she would not need app for 01/30.  Please call pt to go over results on #[phone]

## 2024-05-14 ENCOUNTER — Other Ambulatory Visit (HOSPITAL_BASED_OUTPATIENT_CLINIC_OR_DEPARTMENT_OTHER): Payer: Self-pay

## 2024-05-18 ENCOUNTER — Other Ambulatory Visit: Payer: Self-pay | Admitting: *Deleted

## 2024-05-18 DIAGNOSIS — M1A00X0 Idiopathic chronic gout, unspecified site, without tophus (tophi): Secondary | ICD-10-CM

## 2024-05-18 MED ORDER — ALLOPURINOL 100 MG PO TABS: 100.0000 mg | ORAL_TABLET | Freq: Every day | ORAL | 1 refills | Status: AC

## 2024-05-19 ENCOUNTER — Telehealth: Payer: Self-pay

## 2024-05-19 ENCOUNTER — Other Ambulatory Visit: Payer: Self-pay

## 2024-05-19 DIAGNOSIS — I10 Essential (primary) hypertension: Secondary | ICD-10-CM

## 2024-05-19 MED ORDER — CARVEDILOL 25 MG PO TABS: ORAL_TABLET | ORAL | 3 refills | Status: AC

## 2024-05-19 NOTE — Telephone Encounter (Unsigned)
 Copied from CRM #[id number]. Topic: Clinical - Medication Refill >> May 19, 2024  3:12 PM Rea ORN wrote: Medication:  carvedilol  (COREG ) 25 MG tablet atorvastatin  (LIPITOR) 40 MG tablet (pt asked for 20 mg)  Has the patient contacted their pharmacy? Yes (Agent: If no, request that the patient contact the pharmacy for the refill. If patient does not wish to contact the pharmacy document the reason why and proceed with request.) (Agent: If yes, when and what did the pharmacy advise?)  This is the patient's preferred pharmacy:  [id number] - [place], [place] - [address] [place] [address] Phone: [phone] Fax: [phone]  Is this the correct pharmacy for this prescription? Yes If no, delete pharmacy and type the correct one.   Has the prescription been filled recently? No  Is the patient out of the medication? Pharmacy unsure if pt is out of the rx  Has the patient been seen for an appointment in the last year OR does the patient have an upcoming appointment? Yes  Can we respond through MyChart? No  Agent: Please be advised that Rx refills may take up to 3 business days. We ask that you follow-up with your pharmacy.

## 2024-05-20 ENCOUNTER — Other Ambulatory Visit: Payer: Self-pay

## 2024-05-20 ENCOUNTER — Other Ambulatory Visit (HOSPITAL_BASED_OUTPATIENT_CLINIC_OR_DEPARTMENT_OTHER): Payer: Self-pay

## 2024-05-20 DIAGNOSIS — M1711 Unilateral primary osteoarthritis, right knee: Secondary | ICD-10-CM

## 2024-05-20 MED ORDER — ADACEL 5-2-15.5 LF-MCG/0.5 IM SUSY
0.5000 mL | PREFILLED_SYRINGE | Freq: Once | INTRAMUSCULAR | 0 refills | Status: AC
  Filled 2024-05-20: qty 0.5, 1d supply, fill #0

## 2024-05-22 ENCOUNTER — Other Ambulatory Visit: Payer: Self-pay

## 2024-05-22 DIAGNOSIS — E785 Hyperlipidemia, unspecified: Secondary | ICD-10-CM

## 2024-05-22 MED ORDER — ATORVASTATIN CALCIUM 40 MG PO TABS: 20.0000 mg | ORAL_TABLET | Freq: Every day | ORAL | 1 refills | Status: AC

## 2024-05-29 ENCOUNTER — Ambulatory Visit: Admitting: Student

## 2024-06-02 ENCOUNTER — Ambulatory Visit: Admitting: Podiatry

## 2024-08-07 ENCOUNTER — Ambulatory Visit: Admitting: Student

## 2024-09-01 ENCOUNTER — Ambulatory Visit: Admitting: Podiatry

## 2024-12-02 ENCOUNTER — Ambulatory Visit
